# Patient Record
Sex: Male | Born: 1935 | Race: White | Hispanic: No | Smoking: Never smoker
Health system: Southern US, Community
[De-identification: ages and names within clinical notes are randomized; demographics above are authoritative.]

## PROBLEM LIST (undated history)

## (undated) DIAGNOSIS — I214 Non-ST elevation (NSTEMI) myocardial infarction: Secondary | ICD-10-CM

## (undated) DIAGNOSIS — R55 Syncope and collapse: Secondary | ICD-10-CM

## (undated) DIAGNOSIS — I480 Paroxysmal atrial fibrillation: Secondary | ICD-10-CM

## (undated) DIAGNOSIS — I2510 Atherosclerotic heart disease of native coronary artery without angina pectoris: Secondary | ICD-10-CM

## (undated) DIAGNOSIS — E785 Hyperlipidemia, unspecified: Secondary | ICD-10-CM

## (undated) DIAGNOSIS — R001 Bradycardia, unspecified: Secondary | ICD-10-CM

## (undated) DIAGNOSIS — I350 Nonrheumatic aortic (valve) stenosis: Secondary | ICD-10-CM

## (undated) DIAGNOSIS — I493 Ventricular premature depolarization: Secondary | ICD-10-CM

## (undated) HISTORY — PX: OTHER SURGICAL HISTORY: SHX169

## (undated) HISTORY — DX: Hyperlipidemia, unspecified: E78.5

## (undated) HISTORY — DX: Syncope and collapse: R55

## (undated) HISTORY — PX: CARDIAC CATHETERIZATION: SHX172

## (undated) HISTORY — DX: Ventricular premature depolarization: I49.3

## (undated) HISTORY — DX: Non-ST elevation (NSTEMI) myocardial infarction: I21.4

## (undated) HISTORY — DX: Bradycardia, unspecified: R00.1

## (undated) HISTORY — PX: EXPLORATORY LAPAROTOMY: SUR591

## (undated) HISTORY — DX: Atherosclerotic heart disease of native coronary artery without angina pectoris: I25.10

---

## 2005-07-07 ENCOUNTER — Ambulatory Visit: Payer: Self-pay | Admitting: Internal Medicine

## 2005-07-07 ENCOUNTER — Inpatient Hospital Stay (HOSPITAL_COMMUNITY): Admission: EM | Admit: 2005-07-07 | Discharge: 2005-07-10 | Payer: Self-pay | Admitting: Emergency Medicine

## 2005-07-23 ENCOUNTER — Ambulatory Visit: Payer: Self-pay | Admitting: Cardiology

## 2005-08-14 ENCOUNTER — Ambulatory Visit: Payer: Self-pay

## 2005-09-07 ENCOUNTER — Ambulatory Visit: Payer: Self-pay | Admitting: Internal Medicine

## 2005-11-28 ENCOUNTER — Ambulatory Visit: Payer: Self-pay

## 2005-11-28 ENCOUNTER — Ambulatory Visit: Payer: Self-pay | Admitting: Internal Medicine

## 2006-04-22 ENCOUNTER — Ambulatory Visit: Payer: Self-pay | Admitting: Internal Medicine

## 2006-09-02 ENCOUNTER — Ambulatory Visit: Payer: Self-pay

## 2006-10-29 ENCOUNTER — Ambulatory Visit: Payer: Self-pay | Admitting: Internal Medicine

## 2006-12-23 ENCOUNTER — Ambulatory Visit: Payer: Self-pay | Admitting: Unknown Physician Specialty

## 2007-06-27 ENCOUNTER — Ambulatory Visit: Payer: Self-pay | Admitting: Internal Medicine

## 2008-03-08 ENCOUNTER — Ambulatory Visit: Payer: Self-pay | Admitting: Internal Medicine

## 2008-09-27 ENCOUNTER — Encounter: Payer: Self-pay | Admitting: Internal Medicine

## 2008-12-02 DIAGNOSIS — I2510 Atherosclerotic heart disease of native coronary artery without angina pectoris: Secondary | ICD-10-CM

## 2008-12-06 ENCOUNTER — Ambulatory Visit: Payer: Self-pay | Admitting: Internal Medicine

## 2008-12-06 DIAGNOSIS — I493 Ventricular premature depolarization: Secondary | ICD-10-CM | POA: Insufficient documentation

## 2008-12-06 DIAGNOSIS — I4949 Other premature depolarization: Secondary | ICD-10-CM

## 2008-12-14 ENCOUNTER — Ambulatory Visit: Payer: Self-pay | Admitting: Internal Medicine

## 2009-02-02 ENCOUNTER — Ambulatory Visit: Payer: Self-pay | Admitting: Internal Medicine

## 2009-02-02 LAB — CONVERTED CEMR LAB
AST: 21 units/L (ref 0–37)
Cholesterol: 131 mg/dL (ref 0–200)
HDL: 48 mg/dL (ref >39.00)
LDL Cholesterol: 68 mg/dL (ref 0–99)
Total CHOL/HDL Ratio: 3
Triglycerides: 77 mg/dL (ref 0.0–149.0)
VLDL: 15.4 mg/dL (ref 0.0–40.0)

## 2009-04-04 ENCOUNTER — Encounter (INDEPENDENT_AMBULATORY_CARE_PROVIDER_SITE_OTHER): Payer: Self-pay | Admitting: *Deleted

## 2009-06-09 ENCOUNTER — Ambulatory Visit: Payer: Self-pay | Admitting: Internal Medicine

## 2009-06-09 DIAGNOSIS — E782 Mixed hyperlipidemia: Secondary | ICD-10-CM

## 2010-04-28 ENCOUNTER — Ambulatory Visit: Payer: Self-pay | Admitting: Internal Medicine

## 2010-04-28 ENCOUNTER — Encounter: Payer: Self-pay | Admitting: Internal Medicine

## 2010-06-07 ENCOUNTER — Encounter: Payer: Self-pay | Admitting: Internal Medicine

## 2010-06-07 ENCOUNTER — Ambulatory Visit
Admission: RE | Admit: 2010-06-07 | Discharge: 2010-06-07 | Payer: Self-pay | Source: Home / Self Care | Attending: Internal Medicine | Admitting: Internal Medicine

## 2010-06-07 DIAGNOSIS — I2510 Atherosclerotic heart disease of native coronary artery without angina pectoris: Secondary | ICD-10-CM | POA: Insufficient documentation

## 2010-06-07 DIAGNOSIS — E7800 Pure hypercholesterolemia, unspecified: Secondary | ICD-10-CM | POA: Insufficient documentation

## 2010-06-08 ENCOUNTER — Other Ambulatory Visit: Payer: Self-pay | Admitting: Internal Medicine

## 2010-06-08 ENCOUNTER — Ambulatory Visit
Admission: RE | Admit: 2010-06-08 | Discharge: 2010-06-08 | Payer: Self-pay | Source: Home / Self Care | Attending: Internal Medicine | Admitting: Internal Medicine

## 2010-06-08 LAB — LIPID PANEL
Cholesterol: 131 mg/dL (ref 0–200)
HDL: 47 mg/dL (ref >39.00)
LDL Cholesterol: 66 mg/dL (ref 0–99)
Total CHOL/HDL Ratio: 3
Triglycerides: 89 mg/dL (ref 0.0–149.0)
VLDL: 17.8 mg/dL (ref 0.0–40.0)

## 2010-06-08 LAB — TSH: TSH: 0.51 u[IU]/mL (ref 0.35–5.50)

## 2010-06-08 LAB — AST: AST: 20 U/L (ref 0–37)

## 2010-06-12 ENCOUNTER — Encounter: Payer: Self-pay | Admitting: Internal Medicine

## 2010-06-15 ENCOUNTER — Encounter: Payer: Self-pay | Admitting: Internal Medicine

## 2010-06-15 DIAGNOSIS — I4891 Unspecified atrial fibrillation: Secondary | ICD-10-CM | POA: Insufficient documentation

## 2010-06-18 LAB — CONVERTED CEMR LAB
BUN: 16 mg/dL (ref 6–23)
CO2: 32 meq/L (ref 19–32)
Calcium: 9.5 mg/dL (ref 8.4–10.5)
Chloride: 105 meq/L (ref 96–112)
Creatinine, Ser: 1.1 mg/dL (ref 0.4–1.5)
GFR calc non Af Amer: 69.78 mL/min (ref >60)
Glucose, Bld: 91 mg/dL (ref 70–99)
Magnesium: 2.2 mg/dL (ref 1.5–2.5)
Potassium: 4 meq/L (ref 3.5–5.1)
Sodium: 142 meq/L (ref 135–145)
TSH: 0.65 microintl units/mL (ref 0.35–5.50)

## 2010-06-20 NOTE — Assessment & Plan Note (Signed)
Summary: per check out/sf  Medications Added METOPROLOL SUCCINATE 25 MG XR24H-TAB (METOPROLOL SUCCINATE) Take one tablet  two times a day NITROSTAT 0.4 MG SUBL (NITROGLYCERIN) 1 tablet under tongue at onset of chest pain; you may repeat every 5 minutes for up to 3 doses.      Allergies Added: NKDA  Visit Type:  Follow-up Primary Provider:  Dr [facility] in [place]  CC:  no complaints.  History of Present Illness: Mr. Wendt is a 77 year old with a history of CAD (s/p NSTEMI in 2/07 with PTCA to an OM), dyslipidemia and PVCs.  I last saw him in July. When I saw him he had a couplet on his EKG.  I set him up for a holter monitor.  We wore this and it showed freq PVCs, couplets, few triplets.  He has remained asymptomatic throughout.  No palpitations.  Labs were normal. He continues to deny chest pain.  He is walking daily.  Current Medications (verified): 1)  Metoprolol Succinate 25 Mg Xr24h-Tab (Metoprolol Succinate) .... Take One Tablet  Two Times A Day 2)  Aspirin Ec 325 Mg Tbec (Aspirin) .... Take One Tablet By Mouth Daily 3)  Zocor 80 Mg Tabs (Simvastatin) .Marland Kitchen.. 1 Tablet At Bedtime 4)  Nitrostat 0.4 Mg Subl (Nitroglycerin) .Marland Kitchen.. 1 Tablet Under Tongue At Onset of Chest Pain; You May Repeat Every 5 Minutes For Up To 3 Doses.  Allergies (verified): No Known Drug Allergies  Past History:  Past Surgical History: Last updated: 12/02/2008 1.  Cardiac catheterization.  2.  Coronary arteriogram.  3.  Left ventriculogram.  4.  Reperfusion of obtuse marginal with a wire alone.     Social History: Last updated: 12/02/2008 He is married, divorced, and now living with his ex-wife.  He has three children.  He continues to drive a tractor-trailers.  He does  not use cigarettes or alcohol or recreational drugs.  Past Medical History:  IMPRESSION:  1.  Non-ST elevated myocardial infarction.  2.  Relative bradycardia, precluding up titration of his beta-blocker.  3.   Remote history of syncope.  4.  Cardiac risk factors notable for, negative hypertension, negative      diabetes, negative cigarettes, question cholesterol, positive family      history.   Current Problems:  ATRIAL FIBRILLATION, PAROXYSMAL (ICD-427.31) CAD (ICD-414.00) PVCs  Review of Systems       All systems reviewed.  Negative to the above problem except as noted.  Vital Signs:  Patient profile:   77 year old male Height:      67 inches Weight:      189 pounds BMI:     29.71 Pulse rate:   57 / minute BP sitting:   105 / 67  (left arm) Cuff size:   regular  Vitals Entered By: Burnett Kanaris, CNA (June 09, 2009 11:55 AM)  Physical Exam  Additional Exam:  HEENT:  Normocephalic, atraumatic. EOMI, PERRLA.  Neck: JVP is normal. No thyromegaly. No bruits.  Lungs: clear to auscultation. No rales no wheezes.  Heart: Regular rate and rhythm. Normal S1, S2. No S3.   No significant murmurs. PMI not displaced.  Abdomen:  Supple, nontender. Normal bowel sounds. No masses. No hepatomegaly.  Extremities:   Good distal pulses throughout. No lower extremity edema.  Musculoskeletal :moving all extremities.  Neuro:   alert and oriented x3.    EKG  Procedure date:  06/09/2009  Findings:      sinus bradycardia.  57 bpm.  Biphasic T waves in III, AVF  Impression & Recommendations:  Problem # 1:  CAD (ICD-414.00) Doing well. Asymtomatic.  Tolerating activity.  continue current regimen.  Problem # 2:  VENTRICULAR ECTOPY (ICD-427.69) Patient asymptomatic.  Normal exam.  No pVCs on EKG today.  Continue to follow.  NO change in regimen.  Problem # 3:  HYPERLIPIDEMIA-MIXED (ICD-272.4) Keep on statin.  LDL was 68, HDL was 48 in September. His updated medication list for this problem includes:    Zocor 80 Mg Tabs (Simvastatin) .Marland Kitchen... 1 tablet at bedtime

## 2010-06-22 NOTE — Miscellaneous (Signed)
  Clinical Lists Changes  Medications: Changed medication from ZOCOR 80 MG TABS (SIMVASTATIN) 1 tablet at bedtime to ZOCOR 80 MG TABS (SIMVASTATIN) one half  tablet at bedtime

## 2010-06-22 NOTE — Assessment & Plan Note (Signed)
Summary: PER CHECK OU T/SF  Medications Added METOPROLOL SUCCINATE 25 MG XR24H-TAB (METOPROLOL SUCCINATE) Take one tablet one time a day      Allergies Added: NKDA  Visit Type:  Follow-up Primary Provider:  Dr [facility] in [place]  CC:  No cardiac complaints.  History of Present Illness: Mr. Machuca is a 77 year old gentleman. He has a history of CAD (status post non-Q-wave MI in February 2007. Status post PTCA to OM lesion at that time.). SInce he was last seen he denies chest pain.    Breathing is OK.  Remains active.  Current Medications (verified): 1)  Metoprolol Succinate 25 Mg Xr24h-Tab (Metoprolol Succinate) .... Take One Tablet  Two Times A Day 2)  Aspirin Ec 325 Mg Tbec (Aspirin) .... Take One Tablet By Mouth Daily 3)  Zocor 80 Mg Tabs (Simvastatin) .Marland Kitchen.. 1 Tablet At Bedtime 4)  Nitrostat 0.4 Mg Subl (Nitroglycerin) .Marland Kitchen.. 1 Tablet Under Tongue At Onset of Chest Pain; You May Repeat Every 5 Minutes For Up To 3 Doses.  Allergies (verified): No Known Drug Allergies  Past History:  Past medical, surgical, family and social histories (including risk factors) reviewed, and no changes noted (except as noted below).  Past Medical History: Reviewed history from 06/09/2009 and no changes required.  IMPRESSION:  1.  Non-ST elevated myocardial infarction.  2.  Relative bradycardia, precluding up titration of his beta-blocker.  3.  Remote history of syncope.  4.  Cardiac risk factors notable for, negative hypertension, negative      diabetes, negative cigarettes, question cholesterol, positive family      history.   Current Problems:  ATRIAL FIBRILLATION, PAROXYSMAL (ICD-427.31) CAD (ICD-414.00) PVCs  Past Surgical History: Reviewed history from 12/02/2008 and no changes required. 1.  Cardiac catheterization.  2.  Coronary arteriogram.  3.  Left ventriculogram.  4.  Reperfusion of obtuse marginal with a wire alone.     Family History: Reviewed history  and no changes required.  Social History: Reviewed history from 12/02/2008 and no changes required. He is married, divorced, and now living with his ex-wife.  He has three children.  He continues to drive a tractor-trailers.  He does  not use cigarettes or alcohol or recreational drugs.  Review of Systems       ALl systems reviewed.  Neg tothe above problem.  Vital Signs:  Patient profile:   77 year old male Height:      67 inches Weight:      183 pounds BMI:     28.77 Pulse rate:   60 / minute Pulse rhythm:   irregular Resp:     18 per minute BP sitting:   100 / 60  (left arm) Cuff size:   large  Vitals Entered By: Vikki Ports (April 28, 2010 8:51 AM)  Physical Exam  Additional Exam:  patient is in NAD HEENT:  Normocephalic, atraumatic. EOMI, PERRLA.  Neck: JVP is normal. No thyromegaly. No bruits.  Lungs: clear to auscultation. No rales no wheezes.  Heart: Regular rate and rhythm. Normal S1, S2. No S3.   No significant murmurs. PMI not displaced.  Abdomen:  Supple, nontender. Normal bowel sounds. No masses. No hepatomegaly.  Extremities:   Good distal pulses throughout. No lower extremity edema.  Musculoskeletal :moving all extremities.  Neuro:   alert and oriented x3.    EKG  Procedure date:  04/28/2010  Findings:      Normal sinus rhythm with ventricular bigeminy  75 bpm.  Impression &  Recommendations:  Problem # 1:  CAD (ICD-414.00) No symtoms to suggest ischemia. His updated medication list for this problem includes:    Metoprolol Succinate 25 Mg Xr24h-tab (Metoprolol succinate) .Marland Kitchen... Take one tablet one time a day    Aspirin Ec 325 Mg Tbec (Aspirin) .Marland Kitchen... Take one tablet by mouth daily    Nitrostat 0.4 Mg Subl (Nitroglycerin) .Marland Kitchen... 1 tablet under tongue at onset of chest pain; you may repeat every 5 minutes for up to 3 doses.  Problem # 2:  VENTRICULAR ECTOPY (ICD-427.69) Would set  up for holter monitor. His updated medication list for this  problem includes:    Metoprolol Succinate 25 Mg Xr24h-tab (Metoprolol succinate) .Marland Kitchen... Take one tablet one time a day    Aspirin Ec 325 Mg Tbec (Aspirin) .Marland Kitchen... Take one tablet by mouth daily    Nitrostat 0.4 Mg Subl (Nitroglycerin) .Marland Kitchen... 1 tablet under tongue at onset of chest pain; you may repeat every 5 minutes for up to 3 doses.  Orders: EKG w/ Interpretation (93000) Holter Monitor (Holter Monitor)  Problem # 3:  HYPERLIPIDEMIA-MIXED (ICD-272.4) Get lipids. His updated medication list for this problem includes:    Zocor 80 Mg Tabs (Simvastatin) .Marland Kitchen... 1 tablet at bedtime  Patient Instructions: 1)  Your physician has recommended that you wear a holter monitor.  Holter monitors are medical devices that record the heart's electrical activity. Doctors most often use these monitors to diagnose arrhythmias. Arrhythmias are problems with the speed or rhythm of the heartbeat. The monitor is a small, portable device. You can wear one while you do your normal daily activities. This is usually used to diagnose what is causing palpitations/syncope (passing out).  OK to schedule in January 2012 2)  Your physician recommends that you return for a FASTING lipid profile: Lipid ast tsh 272.0 414.01 schedule same day as holter. 3)  Your physician wants you to follow-up in: 6 months  You will receive a reminder letter in the mail two months in advance. If you don't receive a letter, please call our office to schedule the follow-up appointment.

## 2010-06-22 NOTE — Miscellaneous (Signed)
  Clinical Lists Changes  Problems: Added new problem of ATRIAL FIBRILLATION (ICD-427.31) Orders: Added new Referral order of Echocardiogram (Echo) - Signed

## 2010-06-28 NOTE — Procedures (Signed)
Summary: Summary Report  Summary Report   Imported By: Erle Crocker 06/21/2010 14:20:28  _____________________________________________________________________  External Attachment:    Type:   Image     Comment:   External Document

## 2010-07-04 ENCOUNTER — Other Ambulatory Visit (INDEPENDENT_AMBULATORY_CARE_PROVIDER_SITE_OTHER): Payer: Medicare Other

## 2010-07-04 ENCOUNTER — Other Ambulatory Visit: Payer: Self-pay | Admitting: Internal Medicine

## 2010-07-04 DIAGNOSIS — I2510 Atherosclerotic heart disease of native coronary artery without angina pectoris: Secondary | ICD-10-CM

## 2010-10-03 NOTE — Assessment & Plan Note (Signed)
[facility]                            CARDIOLOGY OFFICE NOTE   NAME:[name] [name], [name] [name]                      MRN:          [id number]  DATE:10/29/2006                            DOB:          17-Jan-1934    IDENTIFICATION:  Patient is a 77 year old gentleman with a history of  CAD (status post non-Q wave MI in February 2007 with PTCA of an occluded  LM opened with a wire).  He has been on Plavix and aspirin for over a  year.   Note, in April he had a Myoview done for work purposes.  He exercised  for 10 minutes to a peak heart rate of 160, which was greater than 100%  predicted maximal (107%).  Myoview portion showed evidence of a small  prior lateral infarct with mild periinfarction ischemia not changed from  previous scans.  Overall, felt to be low risk.  EF was 55%.   Again, in talking to the patient, he denies chest pain, walking sound.   CURRENT MEDICATIONS:  Include:  1. Lipitor 80.  2. Plavix 75.  3. Aspirin 325.  4. Metoprolol 25 b.i.d.  5. Flomax 0.4.   PHYSICAL EXAMINATION:  On exam, the patient is in no distress.  Blood pressure is 116/68.  Pulse is 62.  Weight 185.  NECK:  JVP is normal.  LUNGS:  Clear.  CARDIAC EXAM:  Regular rate and rhythm.  S1 and S2.  No S3.  ABDOMEN:  Benign.  EXTREMITIES:  No edema.   IMPRESSION:  1. Coronary artery disease.  Clinically doing well.  Again, we will      confirm with Dr. Samule Ohm regarding Plavix, but may discontinue.      Continue other medications.  2. Dyslipidemia.  Excellent control.  I just saw the values that were      taken, and I would continue.  LDL was 61, HDL of 40.   Set followup for about 8 months, sooner if problems develop.     Pricilla Riffle, MD, [facility]  Electronically Signed   PVR/MedQ  DD: 10/29/2006  DT: 10/29/2006  Job #: [id number]   cc:   Yates Decamp

## 2010-10-03 NOTE — Assessment & Plan Note (Signed)
[facility]                            CARDIOLOGY OFFICE NOTE   NAME:[name], [name] HOWLAND                      MRN:          [id number]  DATE:06/27/2007                            DOB:          [birth year]    IDENTIFICATION:  [name] [name] is a 77 year old gentleman with a history of  CAD (status post non-Q-wave MI in February 2007 with PTCA to an OM  lesion).  He is currently on aspirin, metoprolol and Lipitor.   He was seen in June of last of this past year.   In the interval he has done okay.  He had one episode of dizziness.  Heart fluttered for a second but has had none since.  No chest pain.  Breathing is good.   CURRENT MEDICATIONS:  Lipitor 80, aspirin 325, metoprolol 25 b.i.d.   PHYSICAL EXAM:  The patient is in no distress.  Blood pressure is 112/68, pulse is 62.  Note orthostatics were checked  which were negative.  LUNGS:  Clear.  Cardiac exam regular rate and rhythm, S1-S2 no S3 no murmurs.  ABDOMEN:  Benign.  EXTREMITIES:  No edema.   IMPRESSION:  1. Coronary artery disease appears to be stable.  Encouraged him to      stay active.  Continue on current regimen.  2. Dyslipidemia.  Last lipid panel I have was remote.  The patient is      due to have it checked and he will have the results faxed.   Otherwise I will set follow-up for the fall sooner if problems develop.     Pricilla Riffle, MD, [facility]  Electronically Signed    PVR/MedQ  DD: 06/29/2007  DT: 06/30/2007  Job #: [id number]   cc:   Yates Decamp

## 2010-10-03 NOTE — Assessment & Plan Note (Signed)
[facility]                            CARDIOLOGY OFFICE NOTE   NAME:[name] [name], [name] [name]                      MRN:          [id number]  DATE:03/08/2008                            DOB:          [birth year]    INTRODUCTION:  Mr. Szilagyi is a 77 year old gentleman with history of CAD  (non-Q-wave MI February 2007 with PTCA to OM lesion).  I last saw him in  February.   In the interval, he denies chest pain.  No dizziness.  No shortness of  breath.  He remains active.  He is currently renovating a home that he  is getting ready to move in to.  He just retired couple weeks ago.   CURRENT MEDICINES:  1. Lipitor 80.  2. Aspirin 325.  3. Metoprolol 25 b.i.d.   PHYSICAL EXAMINATION:  GENERAL:  The patient is in no distress.  VITAL SIGNS:  Blood pressure 103/63, pulse 67 and regular, weight 182.  NECK:  JVP is normal.  No bruits.  LUNGS:  Clear.  CARDIAC:  Regular rate and rhythm, S1, S2.  No S3.  No significant  murmurs.  ABDOMEN:  Benign.  EXTREMITIES:  No edema.   IMPRESSION:  1. Coronary artery disease, clinically doing well.  Would continue.  2. Dyslipidemia.  Last lipid panel at the [facility] was      excellent, LDL of 55, HDL of 42.  Would continue.  I told him when      he gets his blood work checked again, to have him fax the results.   I had encouraged him to stay active.  I will set to see him back in  about 9 months' time.     Pricilla Riffle, MD, [facility]  Electronically Signed    PVR/MedQ  DD: 03/08/2008  DT: 03/09/2008  Job #: [id number]   cc:   Yates Decamp

## 2010-10-06 NOTE — H&P (Signed)
NAME:  [name] [name], BREEDEN NO.:  [id number]   MEDICAL RECORD NO.:  [id number]          PATIENT TYPE:  EMS   LOCATION:  MAJO                         FACILITY:  [facility]   PHYSICIAN:  Duke Salvia, M.D.  DATE OF BIRTH:  11/17/1933   DATE OF ADMISSION:  07/07/2005  DATE OF DISCHARGE:                                HISTORY & PHYSICAL   Mr. [name] [name] is a 77 year old gentleman who had no known prior heart  history who does have a history of GE reflux disease and last night after  dinner, developed a severe substernal chest discomfort that he describes as  a pressure radiating into his neck and his arms bilaterally.  He took Tums  and antacids with mild relief, sat around, symptoms ameliorated somewhat.  This morning he called his brother who has a history of coronary disease,  prior CABG, came over and gave him nitroglycerin with significant relief of  his discomfort, and he was brought here to [facility] for further  treatment.  Despite aggressive medical therapy, he remains with some  discomfort which he describes as a heaviness in his chest still.   His cardiac risk factors are negative for hypertension, diabetes, cigarette  use.  His cholesterol status is not known but he thinks that he has been  told that it is good.   Of cardiac note, is an episode of syncope about three to four years ago that  occurred while he was driving his car across a parking lot.  He has no prior  syncope and no subsequent syncope.  He does have a history of tachy  palpitations over the last 10 years.  These episodes last typically about 30  seconds and are associated with lightheadedness and shortness of breath but  no chest discomfort.   He takes no medications.   He is allergic to TETANUS.   REVIEW OF SYSTEMS:  Broadly negative.   SOCIAL HISTORY:  He is married, divorced, and now living with his ex-wife.  He has three children.  He continues to drive a tractor-trailers.   He does  not use cigarettes or alcohol or recreational drugs.   PHYSICAL EXAMINATION:  GENERAL:  He is an elderly Caucasian male appearing  considerably older than his stated age of 15.  VITAL SIGNS:  His blood pressure was 123/74 with a pulse of 69 initially.  It is now 97/46 on nitroglycerin with a pulse of 55.  He has also received  IV Lopressor.  His weight is 81 kilos.  HEENT:  Demonstrated no icterus or xanthomata.  NECK:  Veins were flat.  Carotids were brisk and full bilaterally without  bruits.  BACK:  Without kyphosis, scoliosis.  LUNGS:  Clear.  HEART:  Sounds were regular without murmurs or gallops.  ABDOMEN:  Soft with active bowel sounds without midline pulsation or  hepatomegaly.  Femoral pulses were 2+.  EXTREMITIES:  Distal pulses were intact.  There is no clubbing, cyanosis, or  edema.  NEUROLOGIC:  Grossly normal.  SKIN:  Warm and dry.   Electrocardiogram, dated earlier this morning at  8:30, demonstrated sinus  rhythm at 55 with intervals of 1.3/1.0/0.43.  There are no ST-T changes.   LABORATORY:  Notable for a positive troponin of 8.4 and an MB of 780 (I  think.)  The MB was greater than 80 off of his POC.  His creatinine was  normal.  His hemogram was normal.   IMPRESSION:  1.  Non-ST elevated myocardial infarction.  2.  Relative bradycardia, precluding up titration of his beta-blocker.  3.  Remote history of syncope.  4.  Cardiac risk factors notable for, negative hypertension, negative      diabetes, negative cigarettes, question cholesterol, positive family      history.   [name] has a non-ST elevation myocardial infarction.  We have not been  able to control his symptoms with aggressive medical therapy including  heparin, aspirin, and Integrilin, low dose beta-blockers, nitroglycerin.  Because of that, we have discussed and plan to take him to urgent  catheterization.           ______________________________  Duke Salvia, M.D.      SCK/MEDQ  D:  07/07/2005  T:  07/07/2005  Job:  [id number]   cc:   Yates Decamp  Fax: [phone]

## 2010-10-06 NOTE — Letter (Signed)
Sep 20, 2006    Donna Bernard, D.O.  [address]  [place], [place] [address]   RE:  EKANSH, SHERK  MRN:  [id number]  /  DOB:  [birth year]   Dear Dr. Orson Aloe:   This is a letter in support of Mr. Stratmann.  He is a 77 year old  gentleman with a history of CAD.  He has had angioplasty in the past  (February 2007).   I saw the patient in clinic back in December.  Clinically he was doing  well.  He denied chest pain.  No dizziness, no shortness of breath.  Remained active.   A Myoview scan was done on April 14 that showed a small lateral infarct  with minimal peri-infarct ischemia.  Overall this is a very low-risk  scan.  It had not changed from a scan in July 2007.   Based on this and his findings, I felt he was okay to drive without any  further testing.  If you have any questions, please feel free to contact  me.    Sincerely,      Pricilla Riffle, MD, [facility]  Electronically Signed    PVR/MedQ  DD: 09/20/2006  DT: 09/20/2006  Job #: [id number]

## 2010-10-06 NOTE — Discharge Summary (Signed)
NAMEBRYLON, [name] [name]               ACCOUNT NO.:  [id number]   MEDICAL RECORD NO.:  [id number]          PATIENT TYPE:  INP   LOCATION:  3715                         FACILITY:  [facility]   PHYSICIAN:  Olga Millers, M.D. LHCDATE OF BIRTH:  Jun 27, 1933   DATE OF ADMISSION:  07/07/2005  DATE OF DISCHARGE:  07/10/2005                                 DISCHARGE SUMMARY   PRIMARY DIAGNOSIS:  Non-ST-segment-elevation myocardial infarction, status  post reperfusion of obtuse marginal branch with wire alone, no other  significant coronary artery disease and ejection fraction 50%.   SECONDARY DIAGNOSES:  1.  Hyperlipidemia with a total cholesterol of 216, triglycerides 132, HDL      43, LDL 147, this admission.  2.  Allergy to TETANUS.  3.  History of tachy-palpitations, brief episode of paroxysmal atrial      fibrillation seen this admission.  4.  Family history of coronary artery disease in his brother, who has had      bypass surgery.  5.  Remote history of syncope.   PROCEDURES:  1.  Cardiac catheterization.  2.  Coronary arteriogram.  3.  Left ventriculogram.  4.  Reperfusion of obtuse marginal with a wire alone.   HOSPITAL COURSE:  [name] [name] is a 77 year old male with no known history of  coronary artery disease.  He had chest discomfort and got some relief with  antacids.  The next day he called his brother because he was still having  discomfort, who gave him some nitroglycerin, as he has a history of heart  disease and bypass surgery.  He [facility] to [facility], but he  continued to have heaviness in his chest despite aggressive treatment and  was taken to the cath lab.   The cardiac catheterization showed a totaled, relatively small obtuse  marginal branch.  It was crossed with a wire and there was less than 20%  residual stenosis.  There was TIMI-3 flow.  The patient's chest discomfort  resolved.  Dr. Samule Ohm evaluated the situation and felt that since the wire  could not  be made to advance around a second bend in the vessel and he had  TIMI-3 flow, he felt that the results were likely to be stable.  No balloon  angioplasty or stenting were performed.  His EF was 50% without regional  wall motion abnormality and no AS or MR.  He had no other critical coronary  artery disease; there was a 20% and a 40% stenosis in the LAD for which  medical therapy was recommended.   He was placed on aspirin indefinitely and is to be on Plavix for a year  secondary to acute coronary syndrome.  He was on Integrilin for 18 hours.  He was started on a statin and a beta blocker.   [name] [name] had an episode of paroxysmal atrial fibrillation which resolved  spontaneously.  His beta blocker was up-titrated to metoprolol 25 mg twice  daily.  He tolerated this well.  His systolic blood pressure was between 100  and 110 and his heart rate was in the 60s.  His  BUN and creatinine were  normal post cath.   His right groin had a bruit post procedure and an ultrasound of his groin is  pending.  If there is no pseudoaneurysm, he is tentatively considered stable  for discharge on July 10, 2005 with outpatient followup arranged.   DISCHARGE INSTRUCTIONS:  1.  His activity level is to be increased gradually.  He is not to drive for      3 days and not to lift anything for 4 weeks.  2.  He is to call our office for problems with the cath site.  3.  He is to stick to a low-fat diet.  4.  He is referred to outpatient cardiac rehab.   DISCHARGE MEDICATIONS:  1.  Lipitor 80 mg daily at supper.  2.  Plavix 75 mg daily.  3.  Aspirin 325 mg daily.  4.  Metoprolol 25 mg twice daily.  5.  Nitroglycerin sublingual p.r.n.  6.  He is to take Tylenol as needed for pain and avoid Aleve if possible.      [name] [name], P.A. [facility]    ______________________________  Olga Millers, M.D. [facility]    RB/MEDQ  D:  07/10/2005  T:  07/11/2005  Job:  [id number]   cc:   Pricilla Riffle, M.D.  [address]  [address]  [place]  [place] [address]   Yates Decamp  Fax: [phone]

## 2010-10-06 NOTE — Assessment & Plan Note (Signed)
[facility]                              CARDIOLOGY OFFICE NOTE   NAME:Tay, PETROS AHART                      MRN:          [id number]  DATE:11/28/2005                            DOB:          14-Oct-1933    IDENTIFICATION:  Mr. Hennick is a 77 year old gentleman with a history of CAD  (status post non-Q-wave MI in February) underwent cardiac catheterization  and found to have occluded OM which was opened with wire.  Residual 20%  stenosis.  He also had a 20-40% stenosis in the LAD.  Plan is for medical  therapy.  LVF was 50%.   The patient had a Myoview scan after which showed low risk.  It had mild  mixed ischemia infarct inferolaterally.   In the interval since seen in the clinic the patient has done well.  He is  active.  He denies chest pain.  No shortness of breath.  Nothing like what  brought him into the emergency room.   CURRENT MEDICATIONS:  1.  Lipitor 80 mg q.h.s.  2.  Plavix 75 daily.  3.  Aspirin 325 daily.  4.  Metoprolol 25 b.i.d.   PHYSICAL EXAMINATION:  GENERAL:  The patient is in no distress.  VITAL SIGNS:  Blood pressure 131/80, pulse 70.  NECK:  JVP is normal.  No bruits.  LUNGS:  Clear to auscultation.  CARDIAC:  Regular rate and rhythm.  S1, S2.  No S3, S4 or murmurs.  ABDOMEN:  Benign.  EXTREMITIES:  No edema.   Myoview scan:  The patient underwent repeat exercise testing with Myoview  today.  He exercised for 8 minutes to peak heart rate of 139 which was 92%  maximal.  The patient experienced to chest pain.  EKG showed no significant  ST changes.   Again, on the Myoview scan he had some mild ischemia scar in the  inferolateral region.  Again, very mild.  The apical region had some  thinning that improved consistent of a very small region of ischemia that  however was not significant quantitatively.  In comparison to 08/14/05 scan  the apical region was a little more prominent but again, the area was small.  LVF was  calculated as 55%.   Overall, the patient is doing well.  I would continue medical therapy.  I do  not think that his scan warrants any further testing for now.  I am not  concerned about any worsening ischemia.   From a cardiac standpoint I think that he is okay to continue driving as  long as he stays on medical therapy.  I will set followup for later in the  winter.                                Pricilla Riffle, MD, [facility]    PVR/MedQ  DD:  11/28/2005  DT:  11/29/2005  Job #:  [id number]

## 2010-10-06 NOTE — Cardiovascular Report (Signed)
NAMEVALON, GLASSCOCK               ACCOUNT NO.:  [id number]   MEDICAL RECORD NO.:  [id number]          PATIENT TYPE:  INP   LOCATION:  1824                         FACILITY:  [facility]   PHYSICIAN:  Salvadore Farber, M.D. LHCDATE OF BIRTH:  10/06/1933   DATE OF PROCEDURE:  07/07/2005  DATE OF DISCHARGE:                              CARDIAC CATHETERIZATION   PROCEDURE:  Left heart catheterization, left ventriculography, coronary  angiography, reperfusion of the occluded obtuse marginal branch using wire  alone.   INDICATIONS:  [name] [name] is a 77 year old gentleman without prior history of  cardiac disease. He presented to the [facility] Emergency Room this morning  with several hours of substernal chest discomfort. He was treated with  aspirin, heparin, eptifibatide, nitroglycerin, and morphine. Pain could not  be relieved. Troponin was elevated at 16. He was referred for urgent  angiography due to ongoing chest pain and myocardial injury.  Electrocardiogram did not demonstrate any ST elevations.   PROCEDURAL TECHNIQUE:  Informed consent was obtained. Under 1% lidocaine  local anesthesia, a 6-French sheath was placed in the right common femoral  artery using modified Seldinger technique. Diagnostic angiography and  ventriculography were performed using JL-4, JR-4, and pigtail catheters.  These images demonstrated the culprit lesion to be an occlusion of a  relatively small obtuse marginal branch. Given his ongoing pain, decision  was made to proceed to percutaneous revascularization.   Anticoagulation was initiated with an additional bolus of eptifibatide to  make for 2 boluses. ACT was maintained at greater than 200 seconds. CHAMPION  study drugs were administered. A __________ 35 guide was advanced over wire  and engaged in the ostium of the left main. A Prowater wire was used to  probe the occlusion. With wire passage across the occlusion, TIMI 3 flow was  established to the distal  vessel. There was less than 20% residual stenosis.  The effected vessel was approximately 1.5 mm in diameter.   After opening the vessel, the wire could not be made to advance around a  second bend in the vessel. Given the resolution of the patient's chest  discomfort, establishment of TIMI 3 flow, and less than 20% residual  stenosis, I thought that the result was likely to be stable; and additional  attempts to probe the very tortuous vessel were with more likely to be  deleterious than helpful. We, therefore, did not proceed to any balloon  angioplasty or stenting. Final angiography demonstrated less than 20%  residual stenosis and TIMI 3 flow to the distal vasculature.   COMPLICATIONS:  None.   FINDINGS:  1.  LV: 128/8/12. EF 50% without regional wall motion abnormality.  2.  No aortic stenosis or mitral regurgitation.   IMPRESSION/RECOMMENDATIONS:  Successful percutaneous revascularization of an  occluded small obtuse marginal branch. The vessel was opened with wire  alone. The patient will be continued on Integrilin for 18 hours. Aspirin  will be continued indefinitely. Plavix will be continued for a year given  his acute coronary syndrome presentation. Statin will be initiated.      Salvadore Farber, M.D. [facility]  Electronically Signed     WED/MEDQ  D:  07/07/2005  T:  07/08/2005  Job:  [id number]   cc:   Duke Salvia, M.D.  [address]  [address]  [place]  [place] [address]   [facility] Dr. Dan Humphreys

## 2010-10-06 NOTE — Assessment & Plan Note (Signed)
[facility]                            CARDIOLOGY OFFICE NOTE   NAME:Bergfeld, [name] [name]                      MRN:          [id number]  DATE:04/22/2006                            DOB:          1933-11-22    PATIENT IDENTIFICATION:  [name] [name] is a 77 year old gentleman with  history of CAD (status post non-Q wave MI in February with PTCA of an  occluded OM, opened with a wire.), although noted to have a 20-40% LAD  lesion.  Last seen in July.   In the interval, he has done very well.  He denies chest pain, shortness  of breath, dizziness.  He remains active.   CURRENT MEDICATIONS:  1. Lipitor 80 q.h.s.  2. Plavix 75 daily.  3. Aspirin 325 daily.  4. Metoprolol 25 b.i.d.   PHYSICAL EXAMINATION:  GENERAL:  The patient is in no distress.  VITAL SIGNS:  Blood pressure 112/62, pulse 63, weight 188.  NECK:  No bruits.  LUNGS:  Clear.  CARDIAC:  Regular rate and rhythm.  S1, S2.  No S3.  No murmurs.  ABDOMEN:  Benign.  EXTREMITIES:  No edema.   STUDIES:  A 12-lead EKG normal sinus rhythm 65 beats per minute,  occasional PVC.  Nonspecific ST changes.   IMPRESSION:  1. Coronary artery disease, clinically doing well.  I would continue      on the current regimen.  Plan was for Plavix for at least a year.      I will discuss with Geralynn Rile regarding long term.  2. Dyslipidemia.  Recent lipid panel done at primary showed a total      cholesterol of 117, LDL 66, HDL 39.  I would continue on Lipitor      80.  3. I will set follow up for six months, sooner if problems develop.     Pricilla Riffle, MD, [facility]  Electronically Signed    PVR/MedQ  DD: 04/22/2006  DT: 04/23/2006  Job #: [id number]   cc:   Dan Humphreys, M.D.

## 2010-12-04 ENCOUNTER — Encounter: Payer: Self-pay | Admitting: Internal Medicine

## 2011-03-04 ENCOUNTER — Other Ambulatory Visit: Payer: Self-pay | Admitting: Internal Medicine

## 2011-03-05 ENCOUNTER — Other Ambulatory Visit: Payer: Self-pay

## 2011-03-05 MED ORDER — SIMVASTATIN 80 MG PO TABS: 80.0000 mg | ORAL_TABLET | Freq: Every day | ORAL | Status: DC

## 2011-03-05 NOTE — Telephone Encounter (Signed)
Med dose wrong

## 2011-06-01 ENCOUNTER — Encounter: Payer: Self-pay | Admitting: Internal Medicine

## 2011-06-02 ENCOUNTER — Other Ambulatory Visit: Payer: Self-pay | Admitting: Internal Medicine

## 2011-06-04 ENCOUNTER — Encounter: Payer: Self-pay | Admitting: Internal Medicine

## 2011-06-04 ENCOUNTER — Ambulatory Visit (INDEPENDENT_AMBULATORY_CARE_PROVIDER_SITE_OTHER): Payer: Medicare Other | Admitting: Internal Medicine

## 2011-06-04 DIAGNOSIS — I4891 Unspecified atrial fibrillation: Secondary | ICD-10-CM

## 2011-06-04 DIAGNOSIS — I4949 Other premature depolarization: Secondary | ICD-10-CM

## 2011-06-04 DIAGNOSIS — I2510 Atherosclerotic heart disease of native coronary artery without angina pectoris: Secondary | ICD-10-CM

## 2011-06-04 DIAGNOSIS — E785 Hyperlipidemia, unspecified: Secondary | ICD-10-CM

## 2011-06-04 LAB — HEPATIC FUNCTION PANEL
AST: 18 U/L (ref 0–37)
Albumin: 4.2 g/dL (ref 3.5–5.2)
Alkaline Phosphatase: 51 U/L (ref 39–117)
Total Bilirubin: 0.9 mg/dL (ref 0.3–1.2)

## 2011-06-04 LAB — CBC WITH DIFFERENTIAL/PLATELET
Eosinophils Relative: 1.3 % (ref 0.0–5.0)
HCT: 41.3 % (ref 39.0–52.0)
Hemoglobin: 14.2 g/dL (ref 13.0–17.0)
Lymphocytes Relative: 34.5 % (ref 12.0–46.0)
Lymphs Abs: 2 10*3/uL (ref 0.7–4.0)
Monocytes Relative: 14.1 % — ABNORMAL HIGH (ref 3.0–12.0)
Neutro Abs: 2.8 10*3/uL (ref 1.4–7.7)
RBC: 4.14 Mil/uL — ABNORMAL LOW (ref 4.22–5.81)
WBC: 5.7 10*3/uL (ref 4.5–10.5)

## 2011-06-04 LAB — TSH: TSH: 0.51 u[IU]/mL (ref 0.35–5.50)

## 2011-06-04 LAB — BASIC METABOLIC PANEL
CO2: 29 mEq/L (ref 19–32)
Calcium: 8.7 mg/dL (ref 8.4–10.5)
Chloride: 97 mEq/L (ref 96–112)
Creatinine, Ser: 0.8 mg/dL (ref 0.4–1.5)
Glucose, Bld: 103 mg/dL — ABNORMAL HIGH (ref 70–99)

## 2011-06-04 LAB — LIPID PANEL
HDL: 61.6 mg/dL (ref >39.00)
LDL Cholesterol: 64 mg/dL (ref 0–99)
Total CHOL/HDL Ratio: 2
Triglycerides: 82 mg/dL (ref 0.0–149.0)

## 2011-06-04 MED ORDER — SIMVASTATIN 80 MG PO TABS: 80.0000 mg | ORAL_TABLET | Freq: Every day | ORAL | Status: DC

## 2011-06-04 MED ORDER — METOPROLOL SUCCINATE ER 25 MG PO TB24: 25.0000 mg | ORAL_TABLET | Freq: Every day | ORAL | Status: DC

## 2011-06-04 NOTE — Patient Instructions (Signed)
Lab work today We will call you with results.  Your physician wants you to follow-up in: 12 months You will receive a reminder letter in the mail two months in advance. If you don't receive a letter, please call our office to schedule the follow-up appointment.  

## 2011-06-04 NOTE — Progress Notes (Signed)
HPI Patient is a 78 year old with a history of CAD (s/p NSTEMI 06/2005.  PTCA of OM at time), PAF, PVCs, dyslipidemia.  I saw him in December 2011  LDL at that time was 66, HDL 47.  Echo in February 1610:  LVEF 50 to 55%, mild TR Currently with cold  But otherwise doing well.  No CP.  Activity  Level is good.  Still driving truck 3 days per week No SOB> Last took metoprolol on Friday.  Allergies  Allergen Reactions  . Tetanus Toxoids     Tetanus shot    Current Outpatient Prescriptions  Medication Sig Dispense Refill  . aspirin EC 325 MG tablet Take 325 mg by mouth daily.        . metoprolol succinate (TOPROL-XL) 25 MG 24 hr tablet Take 25 mg by mouth daily.        . nitroGLYCERIN (NITROSTAT) 0.4 MG SL tablet Place 0.4 mg under the tongue every 5 (five) minutes as needed.        . simvastatin (ZOCOR) 80 MG tablet Take 1 tablet (80 mg total) by mouth at bedtime. Take 1 tab daily as directed  30 tablet  6    Past Medical History  Diagnosis Date  . Non-ST elevated myocardial infarction (non-STEMI)   . Bradycardia     relative, precluding up titration of his beta-blocker  . Syncope     hx  . Atrial fibrillation     paroxysmal  . CAD (coronary artery disease)   . PVC's (premature ventricular contractions)     Past Surgical History  Procedure Date  . Cardiac catheterization   . Coronary arteriogram   . Left ventriculogram   . Reperfusion of obtuse marginal with a wife alone     No family history on file.  History   Social History  . Marital Status: Married    Spouse Name: N/A    Number of Children: 3  . Years of Education: N/A   Occupational History  . Not on file.   Social History Main Topics  . Smoking status: Never Smoker   . Smokeless tobacco: Not on file  . Alcohol Use: No  . Drug Use: No  . Sexually Active: Not on file   Other Topics Concern  . Not on file   Social History Narrative   He is married, divorced, and now leaving with his ex-wife. Continues  to drive tractor-trailers.     Review of Systems:  All systems reviewed.  They are negative to the above problem except as previously stated.  Vital Signs: BP 107/70  Pulse 69  Ht 5\' 7"  (1.702 m)  Wt 186 lb (84.369 kg)  BMI 29.13 kg/m2  Physical Exam  HEENT:  Normocephalic, atraumatic. EOMI, PERRLA.  Neck: JVP is normal. No thyromegaly. No bruits.  Lungs: clear to auscultation. No rales no wheezes.  Heart: Regular rate and rhythm. Normal S1, S2. No S3.   No significant murmurs. PMI not displaced.  Abdomen:  Supple, nontender. Normal bowel sounds. No masses. No hepatomegaly.  Extremities:   Good distal pulses throughout. No lower extremity edema.  Musculoskeletal :moving all extremities.  Neuro:   alert and oriented x3.  CN II-XII grossly intact.  EKG:  SR.  69 bpm. T wave inversion III.  No change from previous.   Assessment and Plan:

## 2011-06-05 NOTE — Assessment & Plan Note (Addendum)
I have looked through all old holters.  He had skips but no afib. I would not Rx.  I would no longer define as an active problem.

## 2011-06-05 NOTE — Assessment & Plan Note (Signed)
Patient remains asypmptomatic.  Refill b blocker.

## 2011-06-05 NOTE — Assessment & Plan Note (Signed)
Will check lipids. 

## 2011-06-05 NOTE — Assessment & Plan Note (Signed)
No symptoms to suggest ischemia. 

## 2011-06-06 ENCOUNTER — Other Ambulatory Visit: Payer: Self-pay | Admitting: *Deleted

## 2011-06-06 DIAGNOSIS — I493 Ventricular premature depolarization: Secondary | ICD-10-CM

## 2011-06-22 ENCOUNTER — Encounter (INDEPENDENT_AMBULATORY_CARE_PROVIDER_SITE_OTHER): Payer: Medicare Other

## 2011-06-22 DIAGNOSIS — R002 Palpitations: Secondary | ICD-10-CM

## 2011-06-22 DIAGNOSIS — I493 Ventricular premature depolarization: Secondary | ICD-10-CM

## 2011-08-06 ENCOUNTER — Other Ambulatory Visit: Payer: Self-pay | Admitting: *Deleted

## 2011-08-06 MED ORDER — NITROGLYCERIN 0.4 MG SL SUBL: 0.4000 mg | SUBLINGUAL_TABLET | SUBLINGUAL | Status: DC | PRN

## 2011-10-01 ENCOUNTER — Telehealth: Payer: Self-pay | Admitting: *Deleted

## 2011-10-01 ENCOUNTER — Ambulatory Visit (INDEPENDENT_AMBULATORY_CARE_PROVIDER_SITE_OTHER): Payer: Medicare Other

## 2011-10-01 VITALS — HR 71

## 2011-10-01 DIAGNOSIS — I493 Ventricular premature depolarization: Secondary | ICD-10-CM

## 2011-10-01 DIAGNOSIS — R001 Bradycardia, unspecified: Secondary | ICD-10-CM

## 2011-10-01 DIAGNOSIS — I499 Cardiac arrhythmia, unspecified: Secondary | ICD-10-CM

## 2011-10-01 NOTE — Telephone Encounter (Signed)
Patient had EKG done in [place] office and reviewed by Dr.Ross. Echo to be scheduled in June because of frequent PVC's.

## 2011-10-01 NOTE — Telephone Encounter (Signed)
Received call from NP from [facility] who is at the patient's home for a general wellness visit. [name] [name] has advised that his apical heart rate is 32 with a BP of 106/60 in  One arm and 98/60 in the other arm. [name] [name] states that the patient is asymptomatic with this low heart rate. Discussed with Dr.Ross and she advised that he report to the [place] office for a 12 lead EKG and then she will review. [name] [name] will let patient know.

## 2012-01-17 ENCOUNTER — Encounter: Payer: Self-pay | Admitting: Physician Assistant

## 2012-01-17 ENCOUNTER — Ambulatory Visit: Payer: Self-pay | Admitting: Surgery

## 2012-01-17 ENCOUNTER — Ambulatory Visit (INDEPENDENT_AMBULATORY_CARE_PROVIDER_SITE_OTHER): Payer: Medicare Other | Admitting: Physician Assistant

## 2012-01-17 VITALS — BP 106/66 | HR 67 | Ht 67.0 in | Wt 173.0 lb

## 2012-01-17 DIAGNOSIS — I4949 Other premature depolarization: Secondary | ICD-10-CM

## 2012-01-17 DIAGNOSIS — E785 Hyperlipidemia, unspecified: Secondary | ICD-10-CM

## 2012-01-17 DIAGNOSIS — I2510 Atherosclerotic heart disease of native coronary artery without angina pectoris: Secondary | ICD-10-CM

## 2012-01-17 DIAGNOSIS — Z01810 Encounter for preprocedural cardiovascular examination: Secondary | ICD-10-CM

## 2012-01-17 LAB — BASIC METABOLIC PANEL
Chloride: 101 mmol/L (ref 98–107)
EGFR (Non-African Amer.): >60
Sodium: 137 mmol/L (ref 136–145)

## 2012-01-17 LAB — CBC WITH DIFFERENTIAL/PLATELET
Basophil #: 0 10*3/uL (ref 0.0–0.1)
Lymphocyte %: 35.8 %
Monocyte %: 13.6 %
Neutrophil %: 47.6 %
Platelet: 156 10*3/uL (ref 150–440)
RBC: 4.27 10*6/uL — ABNORMAL LOW (ref 4.40–5.90)
RDW: 12.1 % (ref 11.5–14.5)
WBC: 6 10*3/uL (ref 3.8–10.6)

## 2012-01-17 NOTE — Progress Notes (Signed)
[address] [place], [place]  [address] Phone: [phone] Fax:  [phone]  Date:  01/17/2012   Name:  [name] [name]   DOB:  05-31-1933   MRN:  [id number]  PCP:  Yates Decamp, MD  Primary Cardiologist:  Dr. Dietrich Pates  Primary Electrophysiologist:  None    History of Present Illness: [name] [name] is a 78 y.o. male who returns for surgical clearance.  He has a hx of CAD, s/p NSTEMI 2/07 => PTCA of OM, HL, reported parox AFib, PVCs.  LHC 2/07 demonstrated an occluded OM, mild nonobstructive disease in the LAD with 20 and 40% lesions and preserved LV function. He had return of perfusion in his OM with wire passage alone. No balloon angioplasty or stenting was required. Nuclear study 4/08: Prior lateral MI with mild peri-infarct ischemia. Echocardiogram 2/12: Mild LVH, EF 50-55%, grade 1 diastolic dysfunction, mild TR. Holter monitor 2/13: Sinus rhythm, frequent PVCs, couplets and triplets, occasional PACs. Last seen by Dr. Tenny Craw 1/13. At that time, she reviewed his chart and saw no evidence of atrial fibrillation and this was taken off of his active problem list. He needs bilateral inguinal hernia repair.   His surgery is scheduled for next week. He denies chest pain. He denies shortness of breath. He is very active 78 year old. He still works part-time driving a truck. He can move 40 pounds without difficulty. He can climb several flights of steps without chest pain or shortness of breath. He can back in the room to push mows yards without chest discomfort. He denies syncope. He denies palpitations. He denies orthopnea, PND or edema.  Wt Readings from Last 3 Encounters:  01/17/12 173 lb (78.472 kg)  06/04/11 186 lb (84.369 kg)  04/28/10 183 lb (83.008 kg)     Past Medical History  Diagnosis Date  . Non-ST elevated myocardial infarction (non-STEMI)     2/07: LHC with occluded OM, LAD 20% + 40%; normal LVF => PTCA of OM with wire passage alone   .  Bradycardia     relative, precluding up titration of his beta-blocker  . Syncope     hx  . CAD (coronary artery disease)   . PVC's (premature ventricular contractions)   . HLD (hyperlipidemia)     Current Outpatient Prescriptions  Medication Sig Dispense Refill  . aspirin EC 325 MG tablet Take 325 mg by mouth daily.        . metoprolol succinate (TOPROL-XL) 25 MG 24 hr tablet Take 1 tablet (25 mg total) by mouth daily.  30 tablet  12  . nitroGLYCERIN (NITROSTAT) 0.4 MG SL tablet Place 1 tablet (0.4 mg total) under the tongue every 5 (five) minutes as needed.  25 tablet  6  . simvastatin (ZOCOR) 80 MG tablet Take 1 tablet (80 mg total) by mouth at bedtime. Take 1 tab daily as directed  30 tablet  12    Allergies: Allergies  Allergen Reactions  . Tetanus Toxoids     Tetanus shot    History  Substance Use Topics  . Smoking status: Never Smoker   . Smokeless tobacco: Not on file  . Alcohol Use: No     ROS:  Please see the history of present illness.   He sometimes gets dizzy with standing abruptly.   All other systems reviewed and negative.   PHYSICAL EXAM: VS:  BP 106/66  Pulse 67  Ht 5\' 7"  (1.702 m)  Wt 173 lb (78.472 kg)  BMI  27.10 kg/m2 Well nourished, well developed, in no acute distress HEENT: normal Neck: no JVD Vascular: No carotid bruits Cardiac:  normal S1, S2; RRR; no murmur Lungs:  clear to auscultation bilaterally, no wheezing, rhonchi or rales Abd: soft, nontender, no hepatomegaly Ext: no edema Skin: warm and dry Neuro:  CNs 2-12 intact, no focal abnormalities noted  EKG:  Sinus rhythm with ventricular bigeminy, heart rate 67, no significant change when compared to prior tracing  ASSESSMENT AND PLAN:  1. Coronary Artery Disease:  The patient does not have any unstable cardiac conditions.  The patient can achieve 4 METs or greater without anginal symptoms.  According to [facility] and AHA guidelines, the patient requires no further cardiac workup prior to their  noncardiac surgery.  The patient should be at acceptable risk.  He should remain on his beta blocker throughout the perioperative period to reduce cardiovascular risk. Followup with Dr. Tenny Craw in 3 months.  2. Hyperlipidemia:  Last lipids done 2/13 with LDL 64.  Continue Zocor.  3. PVCs:  This is a chronic problem. He is asymptomatic. He's had several Holter monitors in the past. Ejection fraction is preserved.  No further workup at this time.  Signed, Tereso Newcomer, PA-C  2:38 PM 01/17/2012

## 2012-01-17 NOTE — Patient Instructions (Addendum)
Your physician recommends that you schedule a follow-up appointment in 3 months with Dr.Ross  

## 2012-01-24 ENCOUNTER — Ambulatory Visit: Payer: Self-pay | Admitting: Surgery

## 2012-04-11 ENCOUNTER — Ambulatory Visit: Payer: Medicare Other | Admitting: Internal Medicine

## 2012-06-02 ENCOUNTER — Encounter: Payer: Self-pay | Admitting: Internal Medicine

## 2012-06-02 ENCOUNTER — Ambulatory Visit (INDEPENDENT_AMBULATORY_CARE_PROVIDER_SITE_OTHER): Payer: Medicare Other | Admitting: Internal Medicine

## 2012-06-02 VITALS — BP 112/70 | HR 78 | Ht 67.0 in | Wt 171.0 lb

## 2012-06-02 DIAGNOSIS — E7800 Pure hypercholesterolemia, unspecified: Secondary | ICD-10-CM

## 2012-06-02 DIAGNOSIS — I2584 Coronary atherosclerosis due to calcified coronary lesion: Secondary | ICD-10-CM

## 2012-06-02 NOTE — Progress Notes (Signed)
HPI [name] [name] is a 79 yo with  a hx of CAD, s/p NSTEMI . LHC 2/07 demonstrated an occluded OM, mild nonobstructive disease in the LAD with 20 and 40% lesions and preserved LV function. He had return of perfusion in his OM with wire passage alone. No balloon angioplasty or stenting was required.  Nuclear study 4/08: Prior lateral MI with mild peri-infarct ischemia. Echocardiogram 2/12: Mild LVH, EF 50-55%, grade 1 diastolic dysfunction, mild TR. Holter monitor 2/13: Sinus rhythm, frequent PVCs, couplets and triplets, occasional PACs.  I saw him in Jan 2013.  Wende Mott saw him in Aug as preop for hernia surgery   SInce seen the patient has done well  He denies CP  Breathing is OK  He remains very active. Also a history of HL  Allergies  Allergen Reactions  . Tetanus Toxoids     Tetanus shot    Current Outpatient Prescriptions  Medication Sig Dispense Refill  . aspirin EC 325 MG tablet Take 325 mg by mouth daily.        . metoprolol succinate (TOPROL-XL) 25 MG 24 hr tablet Take 1 tablet (25 mg total) by mouth daily.  30 tablet  12  . nitroGLYCERIN (NITROSTAT) 0.4 MG SL tablet Place 1 tablet (0.4 mg total) under the tongue every 5 (five) minutes as needed.  25 tablet  6  . simvastatin (ZOCOR) 80 MG tablet Take 1 tablet (80 mg total) by mouth at bedtime. Take 1 tab daily as directed  30 tablet  12    Past Medical History  Diagnosis Date  . Non-ST elevated myocardial infarction (non-STEMI)     2/07: LHC with occluded OM, LAD 20% + 40%; normal LVF => PTCA of OM with wire passage alone   . Bradycardia     relative, precluding up titration of his beta-blocker  . Syncope     hx  . CAD (coronary artery disease)   . PVC's (premature ventricular contractions)   . HLD (hyperlipidemia)     Past Surgical History  Procedure Date  . Cardiac catheterization   . Coronary arteriogram   . Left ventriculogram   . Reperfusion of obtuse marginal with a wife alone     No family history on  file.  History   Social History  . Marital Status: Married    Spouse Name: N/A    Number of Children: 3  . Years of Education: N/A   Occupational History  . Not on file.   Social History Main Topics  . Smoking status: Never Smoker   . Smokeless tobacco: Not on file  . Alcohol Use: No  . Drug Use: No  . Sexually Active: Not on file   Other Topics Concern  . Not on file   Social History Narrative   He is married, divorced, and now leaving with his ex-wife. Continues to drive tractor-trailers.     Review of Systems:  All systems reviewed.  They are negative to the above problem except as previously stated.  Vital Signs: BP 112/70  Pulse 78  Ht 5\' 7"  (1.702 m)  Wt 171 lb (77.565 kg)  BMI 26.78 kg/m2  Physical Exam Patient is in NAD HEENT:  Normocephalic, atraumatic. EOMI, PERRLA.  Neck: JVP is normal.  No bruits.  Lungs: clear to auscultation. No rales no wheezes.  Heart: Regular rate and rhythm. Normal S1, S2. No S3.   No significant murmurs. PMI not displaced.  Abdomen:  Supple, nontender. Normal bowel sounds. No  masses. No hepatomegaly.  Extremities:   Good distal pulses throughout. No lower extremity edema.  Musculoskeletal :moving all extremities.  Neuro:   alert and oriented x3.  CN II-XII grossly intact.  EKG  SR 78 bpm.   Assessment and Plan: 1.  CAD  No symptoms of active angina  Keep on same regimen  Stay active.  2.  HL  Will check fasting lipids today  3.  HTN  BP is good.    F/U in 1 year.  Enouraged him again to remain avtive.

## 2012-06-03 LAB — CBC WITH DIFFERENTIAL/PLATELET
Basophils Relative: 0.1 % (ref 0.0–3.0)
HCT: 41.5 % (ref 39.0–52.0)
Hemoglobin: 14.1 g/dL (ref 13.0–17.0)
Lymphocytes Relative: 19.4 % (ref 12.0–46.0)
Lymphs Abs: 2.4 10*3/uL (ref 0.7–4.0)
Monocytes Relative: 8.6 % (ref 3.0–12.0)
Neutro Abs: 8.9 10*3/uL — ABNORMAL HIGH (ref 1.4–7.7)
RBC: 4.2 Mil/uL — ABNORMAL LOW (ref 4.22–5.81)
RDW: 11.9 % (ref 11.5–14.6)

## 2012-06-03 LAB — BASIC METABOLIC PANEL
BUN: 19 mg/dL (ref 6–23)
CO2: 34 mEq/L — ABNORMAL HIGH (ref 19–32)
Calcium: 9.5 mg/dL (ref 8.4–10.5)
Chloride: 100 mEq/L (ref 96–112)
Creatinine, Ser: 0.7 mg/dL (ref 0.4–1.5)
Glucose, Bld: 91 mg/dL (ref 70–99)

## 2012-06-03 LAB — LIPID PANEL
Cholesterol: 140 mg/dL (ref 0–200)
LDL Cholesterol: 66 mg/dL (ref 0–99)
Total CHOL/HDL Ratio: 2
VLDL: 15.6 mg/dL (ref 0.0–40.0)

## 2012-06-04 ENCOUNTER — Telehealth: Payer: Self-pay | Admitting: Cardiology

## 2012-06-04 NOTE — Telephone Encounter (Signed)
Lab results were given to Mrs Senne.

## 2012-06-04 NOTE — Telephone Encounter (Signed)
PT WIFE WOULD LIKE RESULTS OF LAB WORK

## 2012-06-30 ENCOUNTER — Other Ambulatory Visit: Payer: Self-pay | Admitting: Internal Medicine

## 2013-02-06 ENCOUNTER — Encounter: Payer: Self-pay | Admitting: Internal Medicine

## 2013-02-06 ENCOUNTER — Ambulatory Visit (INDEPENDENT_AMBULATORY_CARE_PROVIDER_SITE_OTHER): Payer: Medicare Other | Admitting: Internal Medicine

## 2013-02-06 VITALS — BP 132/95 | HR 57 | Ht 67.0 in | Wt 167.0 lb

## 2013-02-06 DIAGNOSIS — I2584 Coronary atherosclerosis due to calcified coronary lesion: Secondary | ICD-10-CM

## 2013-02-06 MED ORDER — NITROGLYCERIN 0.4 MG SL SUBL: 0.4000 mg | SUBLINGUAL_TABLET | SUBLINGUAL | Status: DC | PRN

## 2013-02-06 MED ORDER — SILDENAFIL CITRATE 50 MG PO TABS: 50.0000 mg | ORAL_TABLET | Freq: Every day | ORAL | Status: DC | PRN

## 2013-02-06 NOTE — Patient Instructions (Addendum)
Stop Simvastatin( Call office back to to report how you are doing )   Start NTG as directed for chest pain   Start Viagra if needed   Your physician wants you to follow-up in: 7/15 You will receive a reminder letter in the mail two months in advance. If you don't receive a letter, please call our office to schedule the follow-up appointment. 1

## 2013-02-06 NOTE — Progress Notes (Signed)
HPI [name] [name] is a 79 yo with  a hx of CAD, s/p NSTEMI . LHC 2/07 demonstrated an occluded OM, mild nonobstructive disease in the LAD with 20 and 40% lesions and preserved LV function. [name] [name] had return of perfusion in his OM with wire passage alone. No balloon angioplasty or stenting was required.  Nuclear study 4/08: Prior lateral MI with mild peri-infarct ischemia. Echocardiogram 2/12: Mild LVH, EF 50-55%, grade 1 diastolic dysfunction, mild TR. Holter monitor 2/13: Sinus rhythm, frequent PVCs, couplets and triplets, occasional PACs.  I saw him in Jan 2014.  Notes [name] [name] is achy in joints and lmuscles  Question meds No CP  No SOB  Walks alot  Allergies  Allergen Reactions  . Tetanus Toxoids     Tetanus shot    Current Outpatient Prescriptions  Medication Sig Dispense Refill  . aspirin EC 325 MG tablet Take 325 mg by mouth daily.        . metoprolol succinate (TOPROL-XL) 25 MG 24 hr tablet take 1 tablet by mouth once daily  30 tablet  12  . nitroGLYCERIN (NITROSTAT) 0.4 MG SL tablet Place 1 tablet (0.4 mg total) under the tongue every 5 (five) minutes as needed.  25 tablet  6  . simvastatin (ZOCOR) 80 MG tablet take 1 tablet by mouth at bedtime as directed  30 tablet  12   No current facility-administered medications for this visit.    Past Medical History  Diagnosis Date  . Non-ST elevated myocardial infarction (non-STEMI)     2/07: LHC with occluded OM, LAD 20% + 40%; normal LVF => PTCA of OM with wire passage alone   . Bradycardia     relative, precluding up titration of his beta-blocker  . Syncope     hx  . CAD (coronary artery disease)   . PVC's (premature ventricular contractions)   . HLD (hyperlipidemia)     Past Surgical History  Procedure Laterality Date  . Cardiac catheterization    . Coronary arteriogram    . Left ventriculogram    . Reperfusion of obtuse marginal with a wife alone      No family history on file.  History   Social History  . Marital Status:  Married    Spouse Name: N/A    Number of Children: 3  . Years of Education: N/A   Occupational History  . Not on file.   Social History Main Topics  . Smoking status: Never Smoker   . Smokeless tobacco: Not on file  . Alcohol Use: No  . Drug Use: No  . Sexual Activity: Not on file   Other Topics Concern  . Not on file   Social History Narrative   [name] [name] is married, divorced, and now leaving with his ex-wife. Continues to drive tractor-trailers.     Review of Systems:  All systems reviewed.  They are negative to the above problem except as previously stated.  Vital Signs: BP 132/95  Pulse 57  Ht 5\' 7"  (1.702 m)  Wt 167 lb (75.751 kg)  BMI 26.15 kg/m2 BP on my check 120/65 L arm  130/70 L arm.  Physical Exam Patient is in NAD HEENT:  Normocephalic, atraumatic. EOMI, PERRLA.  Neck: JVP is normal.  No bruits.  Lungs: clear to auscultation. No rales no wheezes.  Heart: Regular rate and rhythm. Normal S1, S2. No S3.   No significant murmurs. PMI not displaced.  Abdomen:  Supple, nontender. Normal bowel sounds. No masses. No hepatomegaly.  Extremities:   Good distal pulses throughout. No lower extremity edema.  Musculoskeletal :moving all extremities.  Neuro:   alert and oriented x3.  CN II-XII grossly intact.  EKG  SB  57   Assessment and Plan: 1.  CAD  No symptoms of active angina  Keep on same regimen  Stay active.  2.  HL  Labs good in [place]. I have told him to hold statin for 1 wk  See if symptoms change  [name] [name] will call back with response  If no change then resume.  Plan f/u labs next summer.  If change meds will have to f/u sooner.  3.  HTN  BP is good    F/U in 1 year.  Enouraged him again to remain avtive.

## 2013-03-11 ENCOUNTER — Other Ambulatory Visit: Payer: Self-pay | Admitting: *Deleted

## 2013-03-11 MED ORDER — SIMVASTATIN 80 MG PO TABS: 80.0000 mg | ORAL_TABLET | Freq: Every day | ORAL | Status: DC

## 2013-07-25 ENCOUNTER — Other Ambulatory Visit: Payer: Self-pay | Admitting: Internal Medicine

## 2013-10-30 ENCOUNTER — Ambulatory Visit (INDEPENDENT_AMBULATORY_CARE_PROVIDER_SITE_OTHER): Payer: Medicare Other | Admitting: Internal Medicine

## 2013-10-30 ENCOUNTER — Encounter: Payer: Self-pay | Admitting: Internal Medicine

## 2013-10-30 VITALS — BP 102/72 | HR 63 | Ht 67.0 in | Wt 170.0 lb

## 2013-10-30 DIAGNOSIS — E785 Hyperlipidemia, unspecified: Secondary | ICD-10-CM

## 2013-10-30 DIAGNOSIS — I2510 Atherosclerotic heart disease of native coronary artery without angina pectoris: Secondary | ICD-10-CM

## 2013-10-30 DIAGNOSIS — I4891 Unspecified atrial fibrillation: Secondary | ICD-10-CM

## 2013-10-30 NOTE — Patient Instructions (Signed)
Your physician recommends that you continue on your current medications as directed. Please refer to the Current Medication list given to you today.  Your physician wants you to follow-up in: 1 YEAR with Dr Tenny Crawoss.  You will receive a reminder letter in the mail two months in advance. If you don't receive a letter, please call our office to schedule the follow-up appointment.  Order given to the patient to have a CBC, BMP, LIPID and AST drawn with primary care physician.  Instructions given to have these results faxed to our office.

## 2013-10-30 NOTE — Progress Notes (Signed)
HPI [name] [name] is a 80 yo with  a hx of CAD, s/p NSTEMI . LHC 2/07 demonstrated an occluded OM, mild nonobstructive disease in the LAD with 20 and 40% lesions and preserved LV function. He had return of perfusion in his OM with wire passage alone. No balloon angioplasty or stenting was required.  Nuclear study 4/08: Prior lateral MI with mild peri-infarct ischemia. Echocardiogram 2/12: Mild LVH, EF 50-55%, grade 1 diastolic dysfunction, mild TR. Holter monitor 2/13: Sinus rhythm, frequent PVCs, couplets and triplets, occasional PACs.   I saw him in Fall 2014 No dizziness  No SOB  No CP  Driving truck parttime  Chores around house.  Just built deck   No palpitations  Due for labs in July    Allergies  Allergen Reactions  . Tetanus Toxoids     Tetanus shot    Current Outpatient Prescriptions  Medication Sig Dispense Refill  . aspirin EC 325 MG tablet Take 325 mg by mouth daily.        . metoprolol succinate (TOPROL-XL) 25 MG 24 hr tablet take 1 tablet by mouth once daily  30 tablet  6  . nitroGLYCERIN (NITROSTAT) 0.4 MG SL tablet Place 1 tablet (0.4 mg total) under the tongue every 5 (five) minutes as needed.  25 tablet  6  . sildenafil (VIAGRA) 50 MG tablet Take 1 tablet (50 mg total) by mouth daily as needed for erectile dysfunction.  10 tablet  1  . simvastatin (ZOCOR) 80 MG tablet Take 1 tablet (80 mg total) by mouth at bedtime.  90 tablet  1   No current facility-administered medications for this visit.    Past Medical History  Diagnosis Date  . Non-ST elevated myocardial infarction (non-STEMI)     2/07: LHC with occluded OM, LAD 20% + 40%; normal LVF => PTCA of OM with wire passage alone   . Bradycardia     relative, precluding up titration of his beta-blocker  . Syncope     hx  . CAD (coronary artery disease)   . PVC's (premature ventricular contractions)   . HLD (hyperlipidemia)     Past Surgical History  Procedure Laterality Date  . Cardiac catheterization    .  Coronary arteriogram    . Left ventriculogram    . Reperfusion of obtuse marginal with a wife alone      No family history on file.  History   Social History  . Marital Status: Married    Spouse Name: N/A    Number of Children: 3  . Years of Education: N/A   Occupational History  . Not on file.   Social History Main Topics  . Smoking status: Never Smoker   . Smokeless tobacco: Not on file  . Alcohol Use: No  . Drug Use: No  . Sexual Activity: Not on file   Other Topics Concern  . Not on file   Social History Narrative   He is married, divorced, and now leaving with his ex-wife. Continues to drive tractor-trailers.     Review of Systems:  All systems reviewed.  They are negative to the above problem except as previously stated.  Vital Signs: BP 102/72  Pulse 63  Ht 5\' 7"  (1.702 m)  Wt 170 lb (77.111 kg)  BMI 26.62 kg/m2 BP on my check 120/65 L arm  130/70 L arm.  Physical Exam Patient is in NAD HEENT:  Normocephalic, atraumatic. EOMI, PERRLA.  Neck: JVP is normal.  No bruits.  Lungs: clear to auscultation. No rales no wheezes.  Heart: Regular rate and rhythm. Normal S1, S2. No S3.   No significant murmurs. PMI not displaced.  Abdomen:  Supple, nontender. Normal bowel sounds. No masses. No hepatomegaly.  Extremities:   Good distal pulses throughout. No lower extremity edema.  Musculoskeletal :moving all extremities.  Neuro:   alert and oriented x3.  CN II-XII grossly intact.  EKG  SR 63 bpm  Occasional PVC   Assessment and Plan: 1.  CAD  No symptoms of active angina  Keep on same regimen  Stay active.  2.  HL  Get labs in July  Taking 40 now.  Achienss gone  3.  HTN  BP is good  No dizziess  F/U in 1 year.  Enouraged him again to remain avtive.

## 2013-12-01 ENCOUNTER — Telehealth: Payer: Self-pay | Admitting: Internal Medicine

## 2013-12-01 NOTE — Telephone Encounter (Signed)
Pt Signed ROI to Have records sent to [employer], I called pt to Verify Before Sending these records And he Stated he No longer needed these records to be Sent he was made aware the Release will be placed In shred Bin Pt Ok'd  7.14.15/km

## 2014-05-09 ENCOUNTER — Other Ambulatory Visit: Payer: Self-pay | Admitting: Internal Medicine

## 2014-05-10 NOTE — Telephone Encounter (Signed)
Should this still be 40mg ? His lipids are managed by his pcp, so I am unsure. Please advise. Thanks, MI

## 2014-05-19 ENCOUNTER — Other Ambulatory Visit: Payer: Self-pay | Admitting: Internal Medicine

## 2014-05-19 MED ORDER — METOPROLOL SUCCINATE ER 25 MG PO TB24: 25.0000 mg | ORAL_TABLET | Freq: Every day | ORAL | Status: DC

## 2014-05-26 NOTE — Telephone Encounter (Signed)
Yes, according to last OV with Dr. Threasa Beardsoss--40 mg daily.  Thanks.

## 2014-05-27 ENCOUNTER — Other Ambulatory Visit: Payer: Self-pay | Admitting: *Deleted

## 2014-05-27 MED ORDER — SIMVASTATIN 40 MG PO TABS: 40.0000 mg | ORAL_TABLET | Freq: Every day | ORAL | Status: DC

## 2014-06-10 ENCOUNTER — Ambulatory Visit: Payer: Self-pay | Admitting: Ophthalmology

## 2014-06-22 ENCOUNTER — Ambulatory Visit: Payer: Self-pay | Admitting: Ophthalmology

## 2014-07-11 DIAGNOSIS — I10 Essential (primary) hypertension: Secondary | ICD-10-CM

## 2014-07-20 ENCOUNTER — Ambulatory Visit: Payer: Self-pay | Admitting: Ophthalmology

## 2014-08-09 ENCOUNTER — Encounter: Payer: Self-pay | Admitting: Internal Medicine

## 2014-09-07 NOTE — Op Note (Signed)
PATIENT NAME:  [name] [name], [name] [name] MR#:  [id number] DATE OF BIRTH:  [birth year]  DATE OF PROCEDURE:  01/24/2012  PREOPERATIVE DIAGNOSIS: Bilateral inguinal hernias.   POSTOPERATIVE DIAGNOSIS: Bilateral inguinal hernias.   PROCEDURE: Laparoscopic bilateral inguinal hernia repairs.   SURGEON: Renda Rolls, M.D.   ANESTHESIA: General.   INDICATIONS: This 81 year old male came in with chief complaint of bulging in the left groin. He had physical findings of bilateral inguinal hernias. He had had previous open inguinal hernia repairs 30 or more years ago. Surgery was recommended for definitive treatment.   DESCRIPTION OF PROCEDURE: The patient was placed on the operating table in the supine position under general endotracheal anesthesia. The abdomen was prepared with ChloraPrep and draped in a sterile manner. A transversely oriented infraumbilical incision was made, oriented slightly to the left of the midline and carried down through subcutaneous tissues to the left anterior rectus sheath, which is incised transversely to expose the medial border of the left rectus muscle. The muscle was retracted to expose the posterior sheath. The balloon dissector cannula was inserted and passed down to touch the pubic tubercle. The balloon was inflated. Viewing the inflation with the 10 mm, 25 degrees laparoscope, the inflation was symmetrical. Anatomic structures were reviewed. The balloon was then deflated, removed and replaced with a 30 mL balloon cannula which was inflated and secured. Next, the properitoneal space was insufflated with carbon dioxide as the laparoscope was introduced. Muscle was identified bilaterally and also the sites of the hernias were identified. Inferior epigastric vessels identified. Next, another incision was made in the left mid abdomen to introduce a 5 mm cannula which is introduced at the site where the transversus abdominis was viewed. Another incision was made on the right to insert a 5 mm  cannula in a similar fashion.   First on the left side, the left inguinal hernia was identified. This was a direct inguinal hernia. The sac was dissected away from the attenuated transversalis fascia and retracted back away from the abdominal wall. Cooper's ligament was identified. Circumferential dissection was carried out around the inferior epigastric vessels. There appeared to be no indirect component. Next, the Atrium mesh was cut to create an oval shape of some 3 x 5 inches with a posterior oblique slit and a small notch anteriorly. This was inserted and passed around the inferior epigastric vessels. The slit was overlapped and closed with a spiral tacker. The mesh was positioned over the defect. The defect itself was some 2 cm in dimension. The mesh was attached to the Cooper's ligament, rectus muscle, and transversalis muscle and fascia with a row of tacks carried laterally to the ileopubic tract. The repair looked good. Attention was turned to the right side where direct inguinal hernia was identified. It was somewhat smaller, about 1.2 cm. The sac was dissected back away from the abdominal wall. The anatomic structures were identified. A similar mesh was inserted and passed around the inferior epigastric vessels, positioned over the defect and attached to the Cooper's ligament, rectus muscle, and transversalis fascia with a row of tacks carried laterally to the iliopubic tract.   Following this, I could see that both repairs looked good. Hemostasis was intact. The cannulas were removed allowing carbon dioxide to escape from the peritoneal cavity. The fascial defect below the umbilicus was closed with interrupted 0 Vicryl figure-of-eight sutures. The skin incisions were closed with 5-0 Monocryl subcuticular sutures and Dermabond. The patient tolerated surgery satisfactorily and was then prepared for transfer to  the recovery room.  ____________________________ Shela CommonsJ. Renda RollsWilton Smith,  MD jws:ap D: 01/24/2012 10:35:59 ET T: 01/24/2012 11:40:57 ET JOB#: [id number]  cc: Adella HareJ. Wilton Smith, MD, <Dictator> Adella HareWILTON J SMITH MD ELECTRONICALLY SIGNED 01/24/2012 19:05

## 2014-09-19 NOTE — Op Note (Signed)
PATIENT NAME:  [name] [name], [name] [name] MR#:  [id number] DATE OF BIRTH:  1934-03-25  DATE OF PROCEDURE:  06/22/2014  PREOPERATIVE DIAGNOSIS:  Nuclear sclerotic cataract of the right eye.   POSTOPERATIVE DIAGNOSIS:  Nuclear sclerotic cataract of the right eye.   OPERATIVE PROCEDURE:  Cataract extraction by phacoemulsification with implant of intraocular lens to right eye.   SURGEON:  Galen ManilaWilliam Nayra Coury, M.D.   ANESTHESIA:  1. Managed anesthesia care.  2. Topical tetracaine drops followed by 2% Xylocaine jelly applied in the preoperative holding area.   COMPLICATIONS:  None.   TECHNIQUE:   Stop and chop.   DESCRIPTION OF PROCEDURE:  The patient was examined and consented in the preoperative holding area where the aforementioned topical anesthesia was applied to the right eye and then brought back to the Operating Room where the right eye was prepped and draped in the usual sterile ophthalmic fashion and a lid speculum was placed. A paracentesis was created with the side port blade and the anterior chamber was filled with viscoelastic. A near clear corneal incision was performed with the steel keratome. A continuous curvilinear capsulorrhexis was performed with a cystotome followed by the capsulorrhexis forceps. Hydrodissection and hydrodelineation were carried out with BSS on a blunt cannula. The lens was removed in a stop and chop technique and the remaining cortical material was removed with the irrigation-aspiration handpiece. The capsular bag was inflated with viscoelastic and the Tecnis ZCB00 23.5-diopter lens, serial number [id number], was placed in the capsular bag without complication. The remaining viscoelastic was removed from the eye with the irrigation-aspiration handpiece. The wounds were hydrated. The anterior chamber was flushed with Miostat and the eye was inflated to physiologic pressure. 0.1 mL of cefuroxime concentration 10 mg/mL was placed in the anterior chamber. The wounds were found to  be water tight. The eye was dressed with Vigamox. The patient was given protective glasses to wear throughout the day and a shield with which to sleep tonight. The patient was also given drops with which to begin a drop regimen today and will follow-up with me in one day.    ____________________________ Jerilee FieldWilliam L. Diego Ulbricht, MD wlp:JT D: 06/22/2014 13:11:54 ET T: 06/22/2014 13:57:52 ET JOB#: [id number]  cc: Deseri Loss L. Wrenley Sayed, MD, <Dictator> Jerilee FieldWILLIAM L Kharis Lapenna MD ELECTRONICALLY SIGNED 06/23/2014 13:56

## 2014-09-19 NOTE — Op Note (Signed)
PATIENT NAME:  [name] [name], [name] [name] MR#:  [id number] DATE OF BIRTH:  05-Feb-1934  DATE OF PROCEDURE:  07/20/2014  PREOPERATIVE DIAGNOSIS:  Nuclear sclerotic cataract of the left eye.   POSTOPERATIVE DIAGNOSIS:  Nuclear sclerotic cataract of the left eye.   OPERATIVE PROCEDURE:  Cataract extraction by phacoemulsification with implant of intraocular lens to left eye.   SURGEON:  Galen ManilaWilliam Jessamine Barcia, MD.   ANESTHESIA:  1. Managed anesthesia care.  2. Topical tetracaine drops followed by 2% Xylocaine jelly applied in the preoperative holding area.   COMPLICATIONS:  None.   TECHNIQUE:   Stop and chop.   DESCRIPTION OF PROCEDURE:  The patient was examined and consented in the preoperative holding area where the aforementioned topical anesthesia was applied to the left eye and then brought back to the Operating Room where the left eye was prepped and draped in the usual sterile ophthalmic fashion and a lid speculum was placed. A paracentesis was created with the side port blade and the anterior chamber was filled with viscoelastic. A near clear corneal incision was performed with the steel keratome. A continuous curvilinear capsulorrhexis was performed with a cystotome followed by the capsulorrhexis forceps. Hydrodissection and hydrodelineation were carried out with BSS on a blunt cannula. The lens was removed in a stop and chop technique and the remaining cortical material was removed with the irrigation-aspiration handpiece. The capsular bag was inflated with viscoelastic and the Tecnis ZCB00 24.0 -diopter lens, serial number [id number] was placed in the capsular bag without complication. The remaining viscoelastic was removed from the eye with the irrigation-aspiration handpiece. The wounds were hydrated. The anterior chamber was flushed with Miostat and the eye was inflated to physiologic pressure. 0.1 mL of cefuroxime concentration 10 mg/mL was placed in the anterior chamber. The wounds were found to be  water tight. The eye was dressed with Vigamox. The patient was given protective glasses to wear throughout the day and a shield with which to sleep tonight. The patient was also given drops with which to begin a drop regimen today and will follow-up with me in one day.    ____________________________ Jerilee FieldWilliam L. Lovie Zarling, MD wlp:mc D: 07/20/2014 21:15:08 ET T: 07/21/2014 09:03:50 ET JOB#: [id number]  cc: Kalayna Noy L. Edilia Ghuman, MD, <Dictator> Jerilee FieldWILLIAM L Jayle Solarz MD ELECTRONICALLY SIGNED 07/21/2014 11:26

## 2014-11-07 ENCOUNTER — Other Ambulatory Visit: Payer: Self-pay | Admitting: Internal Medicine

## 2014-11-08 ENCOUNTER — Other Ambulatory Visit: Payer: Self-pay

## 2014-11-08 MED ORDER — SIMVASTATIN 40 MG PO TABS: 40.0000 mg | ORAL_TABLET | Freq: Every day | ORAL | Status: DC

## 2014-11-09 ENCOUNTER — Encounter: Payer: Self-pay | Admitting: *Deleted

## 2014-11-09 ENCOUNTER — Telehealth: Payer: Self-pay | Admitting: *Deleted

## 2014-11-09 NOTE — Telephone Encounter (Signed)
called for fm hx & status, spoke with pt and completed chart.

## 2014-11-11 ENCOUNTER — Ambulatory Visit (INDEPENDENT_AMBULATORY_CARE_PROVIDER_SITE_OTHER): Payer: Medicare Other | Admitting: Internal Medicine

## 2014-11-11 ENCOUNTER — Encounter: Payer: Self-pay | Admitting: Internal Medicine

## 2014-11-11 VITALS — BP 106/62 | HR 62 | Ht 67.0 in | Wt 173.6 lb

## 2014-11-11 DIAGNOSIS — I10 Essential (primary) hypertension: Secondary | ICD-10-CM

## 2014-11-11 DIAGNOSIS — E785 Hyperlipidemia, unspecified: Secondary | ICD-10-CM

## 2014-11-11 NOTE — Progress Notes (Signed)
Cardiology Office Note   Date:  11/11/2014   ID:  [name] [name], DOB 1933-12-29, MRN [id number]  PCP:  Rafael Bihari, MD  Cardiologist:   Dietrich Pates, MD   No chief complaint on file.  F/U of CAD     History of Present Illness: [name] [name] is a 81 y.o. male with a history of CAD, s/p NSTEMI . LHC 2/07 demonstrated an occluded OM, mild nonobstructive disease in the LAD with 20 and 40% lesions and preserved LV function. He had return of perfusion in his OM with wire passage alone. No balloon angioplasty or stenting was required.  Nuclear study 4/08: Prior lateral MI with mild peri-infarct ischemia. Echocardiogram 2/12: Mild LVH, EF 50-55%, grade 1 diastolic dysfunction, mild TR. Holter monitor 2/13: Sinus rhythm, frequent PVCs, couplets and triplets, occasional PACs.    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 aspirin EC 325 MG tablet Take 325 mg by mouth daily.      . metoprolol succinate (TOPROL-XL) 25 MG 24 hr tablet Take 1 tablet (25 mg total) by mouth daily. 30 tablet 6  . nitroGLYCERIN (NITROSTAT) 0.4 MG SL tablet Place 1 tablet (0.4 mg total) under the tongue every 5 (five) minutes as needed. 25 tablet 6  . sildenafil (VIAGRA) 50 MG tablet Take 1 tablet (50 mg total) by mouth daily as needed for erectile dysfunction. 10 tablet 1  . simvastatin (ZOCOR) 40 MG tablet Take 1 tablet (40 mg total) by mouth daily. 90 tablet 1   No current facility-administered medications for this visit.    Allergies:   Tetanus toxoids   Past Medical History  Diagnosis Date  . Non-ST elevated myocardial infarction (non-STEMI)     2/07: LHC with occluded OM, LAD 20% + 40%; normal LVF => PTCA of OM with wire passage alone   . Bradycardia     relative, precluding up titration  of his beta-blocker  . Syncope     hx  . CAD (coronary artery disease)   . PVC's (premature ventricular contractions)   . HLD (hyperlipidemia)     Past Surgical History  Procedure Laterality Date  . Cardiac catheterization    . Coronary arteriogram    . Left ventriculogram    . Reperfusion of obtuse marginal with a wife alone       Social History:  The patient  reports that he has never smoked. He has quit using smokeless tobacco. His smokeless tobacco use included Chew. He reports that he does not drink alcohol or use illicit drugs.   Family History:  The patient's family history includes Heart attack in his father and mother.    ROS:  Please see the history of present illness. All other systems are reviewed and  Negative to the above problem except as noted.    PHYSICAL EXAM: VS:  BP 106/62 mmHg  Pulse 62  Ht  (1.702 m)  Wt 173 lb 9.6 oz (78.744 kg)  BMI 27.18 kg/m2  SpO2 97%  GEN: Well nourished, well developed, in no acute distress HEENT: normal Neck: no JVD, carotid bruits, or masses Cardiac: RRR; no murmurs, rubs, or gallops,no edema  Respiratory:  clear  to auscultation bilaterally, normal work of breathing GI: soft, nontender, nondistended, + BS  No hepatomegaly  MS: no deformity Moving all extremities   Skin: warm and dry, no rash Neuro:  Strength and sensation are intact Psych: euthymic mood, full affect   EKG:  EKG is not  ordered today.   Lipid Panel    Component Value Date/Time   CHOL 140 06/02/2012 1742   TRIG 78.0 06/02/2012 1742   HDL 58.00 06/02/2012 1742   CHOLHDL 2 06/02/2012 1742   VLDL 15.6 06/02/2012 1742   LDLCALC 66 06/02/2012 1742      Wt Readings from Last 3 Encounters:  11/11/14 173 lb 9.6 oz (78.744 kg)  10/30/13 170 lb (77.111 kg)  02/06/13 167 lb (75.751 kg)      ASSESSMENT AND PLAN:  1  CAD No sympotms of angina Active 2.  Lipids  Wlll contact [facility] for recent labs  Will set f/u for Fall 2017       SignedDietrich Pates, MD  11/11/2014 11:51 PM    [facility] [address] [place], [place], [place]  [address] Phone: [phone]; Fax: [phone]

## 2014-11-11 NOTE — Patient Instructions (Signed)
Your physician recommends that you continue on your current medications as directed. Please refer to the Current Medication list given to you today. Your physician wants you to follow-up in: 15 MONTHS WITH DR Tenny Craw   You will receive a reminder letter in the mail two months in advance. If you don't receive a letter, please call our office to schedule the follow-up appointment.

## 2014-12-06 ENCOUNTER — Telehealth: Payer: Self-pay | Admitting: Internal Medicine

## 2014-12-06 NOTE — Telephone Encounter (Signed)
Spoke with pt and informed him that Dr. Tenny Crawoss had his paperwork in her folder. Informed pt that he would receive a call once papers are ready. Pt verbalized understanding and was in agreement with this plan.

## 2014-12-06 NOTE — Telephone Encounter (Signed)
New Message  Pt calling to follow up on paperwork that was supposed to be signed by Dr. Tenny Crawoss- concerning his return to work. Please call back and discuss.

## 2014-12-07 ENCOUNTER — Telehealth: Payer: Self-pay | Admitting: Internal Medicine

## 2014-12-07 NOTE — Telephone Encounter (Signed)
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

## 2014-12-07 NOTE — Telephone Encounter (Signed)
Informed pt that he would get a call as soon as paperwork is ready. Pt verbalized understanding.

## 2014-12-08 NOTE — Telephone Encounter (Signed)
Follow up    Pt is calling about DOT paperwork.  Is paperwork completed? Please call to discuss

## 2014-12-08 NOTE — Telephone Encounter (Signed)
Wife states that [place] and DOT told him that he would need a stress test before he could return to work. Informed wife that I would send this information to Dr. Tenny Crawoss for review and advisement on ordering a stress test. Wife verbalized understanding.

## 2014-12-08 NOTE — Telephone Encounter (Signed)
New Message  Pt wife calling to sched an ETT for the [employer]. Pt was made aware that no order was in the system and requested to speak w/ the RN. Please call back and discuss.

## 2014-12-08 NOTE — Telephone Encounter (Signed)
Spoke with patient and explained we needed an order for ETT before we could schedule  Will forward to Dr Tenny Crawoss for review

## 2015-01-06 ENCOUNTER — Other Ambulatory Visit: Payer: Self-pay | Admitting: Internal Medicine

## 2015-05-24 ENCOUNTER — Other Ambulatory Visit: Payer: Self-pay | Admitting: *Deleted

## 2015-05-24 MED ORDER — SIMVASTATIN 40 MG PO TABS: 40.0000 mg | ORAL_TABLET | Freq: Every day | ORAL | Status: DC

## 2015-08-17 ENCOUNTER — Other Ambulatory Visit: Payer: Self-pay | Admitting: Internal Medicine

## 2015-12-20 ENCOUNTER — Other Ambulatory Visit: Payer: Self-pay | Admitting: Internal Medicine

## 2016-01-08 ENCOUNTER — Other Ambulatory Visit: Payer: Self-pay | Admitting: Internal Medicine

## 2016-01-27 ENCOUNTER — Encounter: Payer: Self-pay | Admitting: Internal Medicine

## 2016-02-08 NOTE — Progress Notes (Signed)
Cardiology Office Note   Date:  02/10/2016   ID:  [name] [name], DOB 05/09/1934, MRN [id number]  PCP:  Rafael BihariWALKER III, JOHN B, MD  Cardiologist:   Dietrich PatesPaula Kenderick Kobler, MD   F/u of CAD     History of Present Illness: [name] Ramprnest B Hornbrook is a 82 y.o. male with a history of CAD, s/p NSTEMI . LHC 2/07 demonstrated an occluded OM, mild nonobstructive disease in the LAD with 20 and 40% lesions and preserved LV function. He had return of perfusion in his OM with wire passage alone. No balloon angioplasty or stenting was required.  Nuclear study 4/08: Prior lateral MI with mild peri-infarct ischemia. Echocardiogram 2/12: Mild LVH, EF 50-55%, grade 1 diastolic dysfunction, mild TR. Holter monitor 2/13: Sinus rhythm, frequent PVCs, couplets and triplets, occasional PACs.   I saw the pt in June 2016  Very active  No CP  No SOB  No dizziness  Occasional skip  Drives tructk  Part time    Outpatient Medications Prior to Visit  Medication Sig Dispense Refill  . aspirin EC 325 MG tablet Take 325 mg by mouth daily.      . metoprolol succinate (TOPROL-XL) 25 MG 24 hr tablet take 1 tablet by mouth once daily 30 tablet 11  . NITROSTAT 0.4 MG SL tablet place 1 tablet under the tongue if needed every 5 minutes for chest pain for 3 doses IF NO RELIEF AFTER 3RD DOSE CALL PRESCRIBER OR 911. 25 tablet 2  . sildenafil (VIAGRA) 50 MG tablet Take 1 tablet (50 mg total) by mouth daily as needed for erectile dysfunction. 10 tablet 1  . simvastatin (ZOCOR) 40 MG tablet take 1 tablet by mouth once daily 90 tablet 0  . nitroGLYCERIN (NITROSTAT) 0.4 MG SL tablet Place 1 tablet (0.4 mg total) under the tongue every 5 (five) minutes as needed. (Patient not taking: Reported on 02/10/2016) 25 tablet 6   No facility-administered medications prior to visit.      Allergies:   Other; Tetanus toxoid, adsorbed; and Tetanus toxoids   Past Medical History:  Diagnosis Date  . Bradycardia    relative, precluding up titration of his  beta-blocker  . CAD (coronary artery disease)   . HLD (hyperlipidemia)   . Non-ST elevated myocardial infarction (non-STEMI) (HCC)    2/07: LHC with occluded OM, LAD 20% + 40%; normal LVF => PTCA of OM with wire passage alone   . PVC's (premature ventricular contractions)   . Syncope    hx    Past Surgical History:  Procedure Laterality Date  . CARDIAC CATHETERIZATION    . coronary arteriogram    . left ventriculogram    . reperfusion of obtuse marginal with a wife alone       Social History:  The patient  reports that he has never smoked. He has quit using smokeless tobacco. His smokeless tobacco use included Chew. He reports that he does not drink alcohol or use drugs.   Family History:  The patient's family history includes Heart attack in his father and mother.    ROS:  Please see the history of present illness. All other systems are reviewed and  Negative to the above problem except as noted.    PHYSICAL EXAM: VS:  BP 130/70   Pulse 62   Ht 5\' 7"  (1.702 m)   Wt 178 lb (80.7 kg)   BMI 27.88 kg/m   GEN: Well nourished, well developed, in no acute distress HEENT: normal Neck:  no JVD, carotid bruits, or masses Cardiac: RRR; no murmurs, rubs, or gallops,no edema  Respiratory:  clear to auscultation bilaterally, normal work of breathing GI: soft, nontender, nondistended, + BS  No hepatomegaly  MS: no deformity Moving all extremities   Skin: warm and dry, no rash Neuro:  Strength and sensation are intact Psych: euthymic mood, full affect   EKG:  EKG is ordered today.  SR 60  Occasional PVC     Lipid Panel    Component Value Date/Time   CHOL 140 06/02/2012 1742   TRIG 78.0 06/02/2012 1742   HDL 58.00 06/02/2012 1742   CHOLHDL 2 06/02/2012 1742   VLDL 15.6 06/02/2012 1742   LDLCALC 66 06/02/2012 1742      Wt Readings from Last 3 Encounters:  02/10/16 178 lb (80.7 kg)  11/11/14 173 lb 9.6 oz (78.7 kg)  10/30/13 170 lb (77.1 kg)      ASSESSMENT AND  PLAN:  1  CAD  No symptoms of angina  Very active   Had stress test 1 year ago at [facility]   OK to continue commercial driving   2  HL  Will check lipids     F/u in 1 year      Current medicines are reviewed at length with the patient today.  The patient does not have concerns regarding medicines.  Signed, Dietrich Pates, MD  02/10/2016 10:19 AM    [facility] [address] [place], [place], [place]  [address] Phone: [phone]; Fax: [phone]

## 2016-02-10 ENCOUNTER — Encounter: Payer: Self-pay | Admitting: Internal Medicine

## 2016-02-10 ENCOUNTER — Ambulatory Visit (INDEPENDENT_AMBULATORY_CARE_PROVIDER_SITE_OTHER): Payer: Medicare Other | Admitting: Internal Medicine

## 2016-02-10 VITALS — BP 130/70 | HR 62 | Ht 67.0 in | Wt 178.0 lb

## 2016-02-10 DIAGNOSIS — E782 Mixed hyperlipidemia: Secondary | ICD-10-CM | POA: Diagnosis not present

## 2016-02-10 DIAGNOSIS — I10 Essential (primary) hypertension: Secondary | ICD-10-CM | POA: Diagnosis not present

## 2016-02-10 LAB — LIPID PANEL
CHOL/HDL RATIO: 2.9 ratio (ref <=5.0)
CHOLESTEROL: 162 mg/dL (ref 125–200)
HDL: 55 mg/dL (ref >=40)
LDL CALC: 88 mg/dL (ref <130)
TRIGLYCERIDES: 96 mg/dL (ref <150)
VLDL: 19 mg/dL (ref <30)

## 2016-02-10 LAB — CBC
HCT: 40.1 % (ref 38.5–50.0)
Hemoglobin: 13.7 g/dL (ref 13.2–17.1)
MCH: 33.3 pg — AB (ref 27.0–33.0)
MCHC: 34.2 g/dL (ref 32.0–36.0)
MCV: 97.6 fL (ref 80.0–100.0)
MPV: 10.1 fL (ref 7.5–12.5)
PLATELETS: 191 10*3/uL (ref 140–400)
RBC: 4.11 MIL/uL — AB (ref 4.20–5.80)
RDW: 12.9 % (ref 11.0–15.0)
WBC: 5.7 10*3/uL (ref 3.8–10.8)

## 2016-02-10 NOTE — Patient Instructions (Signed)
Medication Instructions:  Your physician recommends that you continue on your current medications as directed. Please refer to the Current Medication list given to you today.   Labwork: TODAY: CBC/LIPID  Testing/Procedures: None Ordered   Follow-Up: Your physician wants you to follow-up in: 1 year with Dr. Tenny Crawoss. You will receive a reminder letter in the mail two months in advance. If you don't receive a letter, please call our office to schedule the follow-up appointment.  Any Other Special Instructions Will Be Listed Below (If Applicable).     If you need a refill on your cardiac medications before your next appointment, please call your pharmacy.

## 2016-03-27 ENCOUNTER — Other Ambulatory Visit: Payer: Self-pay | Admitting: Internal Medicine

## 2017-02-21 ENCOUNTER — Other Ambulatory Visit: Payer: Self-pay | Admitting: Internal Medicine

## 2017-03-21 ENCOUNTER — Encounter: Payer: Self-pay | Admitting: Internal Medicine

## 2017-03-21 ENCOUNTER — Telehealth: Payer: Self-pay | Admitting: Internal Medicine

## 2017-03-21 ENCOUNTER — Ambulatory Visit (INDEPENDENT_AMBULATORY_CARE_PROVIDER_SITE_OTHER): Payer: Medicare Other | Admitting: Internal Medicine

## 2017-03-21 VITALS — BP 124/66 | HR 71 | Ht 67.0 in | Wt 174.1 lb

## 2017-03-21 DIAGNOSIS — E782 Mixed hyperlipidemia: Secondary | ICD-10-CM | POA: Diagnosis not present

## 2017-03-21 DIAGNOSIS — I2510 Atherosclerotic heart disease of native coronary artery without angina pectoris: Secondary | ICD-10-CM

## 2017-03-21 LAB — LIPID PANEL
CHOL/HDL RATIO: 2.7 ratio (ref 0.0–5.0)
Cholesterol, Total: 192 mg/dL (ref 100–199)
HDL: 72 mg/dL (ref >39)
LDL Calculated: 104 mg/dL — ABNORMAL HIGH (ref 0–99)
TRIGLYCERIDES: 82 mg/dL (ref 0–149)
VLDL Cholesterol Cal: 16 mg/dL (ref 5–40)

## 2017-03-21 LAB — CBC
HEMATOCRIT: 42 % (ref 37.5–51.0)
Hemoglobin: 14.8 g/dL (ref 13.0–17.7)
MCH: 34 pg — ABNORMAL HIGH (ref 26.6–33.0)
MCHC: 35.2 g/dL (ref 31.5–35.7)
MCV: 97 fL (ref 79–97)
PLATELETS: 172 10*3/uL (ref 150–379)
RBC: 4.35 x10E6/uL (ref 4.14–5.80)
RDW: 12.5 % (ref 12.3–15.4)
WBC: 5.7 10*3/uL (ref 3.4–10.8)

## 2017-03-21 MED ORDER — ASPIRIN EC 81 MG PO TBEC: 81.0000 mg | DELAYED_RELEASE_TABLET | Freq: Every day | ORAL | Status: DC

## 2017-03-21 NOTE — Telephone Encounter (Signed)
Pt's PCP updated in EPIC

## 2017-03-21 NOTE — Telephone Encounter (Signed)
New Message  FYI: Family Doctor is Dr Marcelino DusterJohn Johnston [phone]

## 2017-03-21 NOTE — Progress Notes (Signed)
Cardiology Office Note   Date:  03/21/2017   ID:  [name] [name], DOB 02/16/1934, MRN [id number]  PCP:  Rafael BihariWalker, John B III, MD  Cardiologist:   Dietrich PatesPaula Alysah Carton, MD   F/u of CAD     History of Present Illness: [name] [name] is a 83 y.o. male with a history of CAD, s/p NSTEMI . LHC 2/07 demonstrated an occluded OM, mild nonobstructive disease in the LAD with 20 and 40% lesions and preserved LV function. He had return of perfusion in his OM with wire passage alone. No balloon angioplasty or stenting was required.  Nuclear study 4/08: Prior lateral MI with mild peri-infarct ischemia. Echocardiogram 2/12: Mild LVH, EF 50-55%, grade 1 diastolic dysfunction, mild TR. Holter monitor 2/13: Sinus rhythm, frequent PVCs, couplets and triplets, occasional PACs.   I saw the pt in Fall 2017  Since seen breathing is good  No CP  Activity is good   New primeary MD took over for Dr Dan HumphreysWalker    Outpatient Medications Prior to Visit  Medication Sig Dispense Refill  . aspirin EC 325 MG tablet Take 325 mg by mouth daily.      . metoprolol succinate (TOPROL-XL) 25 MG 24 hr tablet take 1 tablet by mouth once daily 30 tablet 0  . NITROSTAT 0.4 MG SL tablet place 1 tablet under the tongue if needed every 5 minutes for chest pain for 3 doses IF NO RELIEF AFTER 3RD DOSE CALL PRESCRIBER OR 911. 25 tablet 2  . simvastatin (ZOCOR) 40 MG tablet take 1 tablet by mouth once daily 30 tablet 0  . sildenafil (VIAGRA) 50 MG tablet Take 1 tablet (50 mg total) by mouth daily as needed for erectile dysfunction. (Patient not taking: Reported on 03/21/2017) 10 tablet 1   No facility-administered medications prior to visit.      Allergies:   Other; Tetanus toxoid, adsorbed; and Tetanus toxoids   Past Medical History:  Diagnosis Date  . Bradycardia    relative, precluding up titration of his beta-blocker  . CAD (coronary artery disease)   . HLD (hyperlipidemia)   . Non-ST elevated myocardial infarction (non-STEMI) (HCC)     2/07: LHC with occluded OM, LAD 20% + 40%; normal LVF => PTCA of OM with wire passage alone   . PVC's (premature ventricular contractions)   . Syncope    hx    Past Surgical History:  Procedure Laterality Date  . CARDIAC CATHETERIZATION    . coronary arteriogram    . left ventriculogram    . reperfusion of obtuse marginal with a wife alone       Social History:  The patient  reports that he has never smoked. He has quit using smokeless tobacco. His smokeless tobacco use included Chew. He reports that he does not drink alcohol or use drugs.   Family History:  The patient's family history includes Heart attack in his father and mother.    ROS:  Please see the history of present illness. All other systems are reviewed and  Negative to the above problem except as noted.    PHYSICAL EXAM: VS:  BP 124/66   Pulse 71   Ht 5\' 7"  (1.702 m)   Wt 174 lb 1.9 oz (79 kg)   BMI 27.27 kg/m   GEN: Well nourished, well developed, in no acute distress HEENT: normal Neck: JVP normal  , carotid bruits, or masses Cardiac: RRR; no murmurs, rubs, or gallops,no edema  Respiratory:  clear to auscultation  bilaterally, normal work of breathing GI: soft, nontender, nondistended, + BS  No hepatomegaly  MS: no deformity Moving all extremities   Skin: warm and dry, no rash Neuro:  Strength and sensation are intact Psych: euthymic mood, full affect   EKG:  EKG is ordered today.  SR 71     Lipid Panel    Component Value Date/Time   CHOL 162 02/10/2016 1031   TRIG 96 02/10/2016 1031   HDL 55 02/10/2016 1031   CHOLHDL 2.9 02/10/2016 1031   VLDL 19 02/10/2016 1031   LDLCALC 88 02/10/2016 1031      Wt Readings from Last 3 Encounters:  03/21/17 174 lb 1.9 oz (79 kg)  02/10/16 178 lb (80.7 kg)  11/11/14 173 lb 9.6 oz (78.7 kg)      ASSESSMENT AND PLAN:  1  CAD No CP  N evid of ischemia  Switch to 81 mg aspirin    2 2  HL  Will check lipids  Today    Stay active      F/u in 1 year       Current medicines are reviewed at length with the patient today.  The patient does not have concerns regarding medicines.  Signed, Dietrich Pates, MD  03/21/2017 9:35 AM    [facility] [address] [place], [place], [place]  [address] Phone: [phone]; Fax: [phone]

## 2017-03-21 NOTE — Patient Instructions (Addendum)
Medication Instructions:  1. CHANGE ASPIRIN TO 81 MG DAILY  Labwork: TODAY CBC, FASTING LIPID  Testing/Procedures: NONE ORDERED TODAY  Follow-Up: Your physician wants you to follow-up in: 1 YEAR WITH DR. Tenny CrawOSS. You will receive a reminder letter in the mail two months in advance. If you don't receive a letter, please call our office to schedule the follow-up appointment.   Any Other Special Instructions Will Be Listed Below (If Applicable).     If you need a refill on your cardiac medications before your next appointment, please call your pharmacy.

## 2017-03-22 ENCOUNTER — Other Ambulatory Visit: Payer: Self-pay | Admitting: *Deleted

## 2017-03-22 DIAGNOSIS — E782 Mixed hyperlipidemia: Secondary | ICD-10-CM

## 2017-03-22 MED ORDER — EZETIMIBE 10 MG PO TABS: 5.0000 mg | ORAL_TABLET | Freq: Every day | ORAL | 3 refills | Status: DC

## 2017-03-25 ENCOUNTER — Other Ambulatory Visit: Payer: Self-pay | Admitting: *Deleted

## 2017-03-25 MED ORDER — SIMVASTATIN 40 MG PO TABS: 40.0000 mg | ORAL_TABLET | Freq: Every day | ORAL | 11 refills | Status: DC

## 2017-03-25 MED ORDER — METOPROLOL SUCCINATE ER 25 MG PO TB24: 25.0000 mg | ORAL_TABLET | Freq: Every day | ORAL | 11 refills | Status: DC

## 2017-05-24 ENCOUNTER — Other Ambulatory Visit: Payer: Medicare Other | Admitting: *Deleted

## 2017-05-24 DIAGNOSIS — E782 Mixed hyperlipidemia: Secondary | ICD-10-CM

## 2017-05-24 LAB — LIPID PANEL
CHOL/HDL RATIO: 2.1 ratio (ref 0.0–5.0)
Cholesterol, Total: 128 mg/dL (ref 100–199)
HDL: 62 mg/dL (ref >39)
LDL CALC: 52 mg/dL (ref 0–99)
TRIGLYCERIDES: 68 mg/dL (ref 0–149)
VLDL CHOLESTEROL CAL: 14 mg/dL (ref 5–40)

## 2018-01-06 ENCOUNTER — Ambulatory Visit: Payer: Medicare Other | Admitting: Internal Medicine

## 2018-01-06 ENCOUNTER — Encounter (INDEPENDENT_AMBULATORY_CARE_PROVIDER_SITE_OTHER): Payer: Self-pay

## 2018-01-06 ENCOUNTER — Encounter: Payer: Self-pay | Admitting: Internal Medicine

## 2018-01-06 VITALS — BP 110/58 | HR 64 | Ht 67.0 in | Wt 175.8 lb

## 2018-01-06 DIAGNOSIS — E782 Mixed hyperlipidemia: Secondary | ICD-10-CM | POA: Diagnosis not present

## 2018-01-06 DIAGNOSIS — I2510 Atherosclerotic heart disease of native coronary artery without angina pectoris: Secondary | ICD-10-CM | POA: Diagnosis not present

## 2018-01-06 NOTE — Progress Notes (Signed)
Cardiology Office Note   Date:  01/06/2018   ID:  [name] [name], DOB 10/14/1933, MRN [id number]  PCP:  Gracelyn NurseJohnston, John D, MD  Cardiologist:   Dietrich PatesPaula Josslynn Mentzer, MD   F/u of CAD     History of Present Illness: [name] [name] is a 84 y.o. male with a history of CAD, s/p NSTEMI . LHC 2/07 demonstrated an occluded OM, mild nonobstructive disease in the LAD with 20 and 40% lesions and preserved LV function. He had return of perfusion in his OM with wire passage alone. No balloon angioplasty or stenting was required.  Nuclear study 4/08: Prior lateral MI with mild peri-infarct ischemia. Echocardiogram 2/12: Mild LVH, EF 50-55%, grade 1 diastolic dysfunction, mild TR. Holter monitor 2/13: Sinus rhythm, frequent PVCs, couplets and triplets, occasional PACs.   I saw the pt in Fall 2018  Since seen the pt denies CP   Breathing is OK   No SOB     Outpatient Medications Prior to Visit  Medication Sig Dispense Refill  . aspirin EC 81 MG tablet Take 1 tablet (81 mg total) by mouth daily.    Marland Kitchen. BEE POLLEN PO Take 1 capsule by mouth daily.    . clopidogrel (PLAVIX) 75 MG tablet Take 75 mg by mouth daily.    Marland Kitchen. ezetimibe (ZETIA) 10 MG tablet Take 0.5 tablets (5 mg total) by mouth daily. 45 tablet 3  . metoprolol succinate (TOPROL-XL) 25 MG 24 hr tablet Take 1 tablet (25 mg total) daily by mouth. 30 tablet 11  . NITROSTAT 0.4 MG SL tablet place 1 tablet under the tongue if needed every 5 minutes for chest pain for 3 doses IF NO RELIEF AFTER 3RD DOSE CALL PRESCRIBER OR 911. 25 tablet 2  . simvastatin (ZOCOR) 40 MG tablet Take 1 tablet (40 mg total) daily by mouth. 30 tablet 11   No facility-administered medications prior to visit.      Allergies:   Other; Tetanus toxoid, adsorbed; and Tetanus toxoids   Past Medical History:  Diagnosis Date  . Bradycardia    relative, precluding up titration of his beta-blocker  . CAD (coronary artery disease)   . HLD (hyperlipidemia)   . Non-ST elevated  myocardial infarction (non-STEMI) (HCC)    2/07: LHC with occluded OM, LAD 20% + 40%; normal LVF => PTCA of OM with wire passage alone   . PVC's (premature ventricular contractions)   . Syncope    hx    Past Surgical History:  Procedure Laterality Date  . CARDIAC CATHETERIZATION    . coronary arteriogram    . left ventriculogram    . reperfusion of obtuse marginal with a wife alone       Social History:  The patient  reports that he has never smoked. He has quit using smokeless tobacco.  His smokeless tobacco use included chew. He reports that he does not drink alcohol or use drugs.   Family History:  The patient's family history includes Heart attack in his father and mother.    ROS:  Please see the history of present illness. All other systems are reviewed and  Negative to the above problem except as noted.    PHYSICAL EXAM: VS:  BP (!) 110/58 (BP Location: Right Arm, Patient Position: Sitting, Cuff Size: Normal)   Pulse 64   Ht 5\' 7"  (1.702 m)   Wt 175 lb 12.8 oz (79.7 kg)   SpO2 95%   BMI 27.53 kg/m   GEN: Well nourished,  well developed, in no acute distress HEENT: NCAT   Neck: JVP normal  , carotid bruits, or masses Cardiac: RRR;  Gr II/VI systolic murmur LSB / base  No  rubs, or gallops,no edema  Respiratory:  clear to auscultation bilaterally, normal work of breathing GI: soft, nontender, nondistended, + BS  No hepatomegaly  MS: s/p L aka Skin: warm and dry, no rash Neuro:  Strength and sensation are intact Psych: euthymic mood, full affect   EKG:  EKG is not  ordered today.     Lipid Panel    Component Value Date/Time   CHOL 128 05/24/2017 0809   TRIG 68 05/24/2017 0809   HDL 62 05/24/2017 0809   CHOLHDL 2.1 05/24/2017 0809   CHOLHDL 2.9 02/10/2016 1031   VLDL 19 02/10/2016 1031   LDLCALC 52 05/24/2017 0809      Wt Readings from Last 3 Encounters:  01/06/18 175 lb 12.8 oz (79.7 kg)  03/21/17 174 lb 1.9 oz (79 kg)  02/10/16 178 lb (80.7 kg)       ASSESSMENT AND PLAN:  1  CAD Remote cath   No symptoms  Of angina    He has been on plavix for awhile (was not on our med list)   I would recomm stopping    COntinue ASA        2 2  HL Lipids excellent n Jan 2019  Encouraged him to stay active      F/u in 1 year      Current medicines are reviewed at length with the patient today.  The patient does not have concerns regarding medicines.  Signed, Dietrich PatesPaula Raywood Wailes, MD  01/06/2018 1:51 PM    [facility] [address], [place], [place]  [address] Phone: [phone]; Fax: [phone]

## 2018-01-06 NOTE — Patient Instructions (Signed)
Your physician has recommended you make the following change in your medication:  1.) stop clopidogrel (Plavix)  Your physician wants you to follow-up in: June 2020 WITH DR. Tenny CrawOSS.  You will receive a reminder letter in the mail two months in advance. If you don't receive a letter, please call our office to schedule the follow-up appointment.

## 2018-06-12 ENCOUNTER — Other Ambulatory Visit: Payer: Self-pay | Admitting: *Deleted

## 2018-06-12 MED ORDER — SIMVASTATIN 40 MG PO TABS: 40.0000 mg | ORAL_TABLET | Freq: Every day | ORAL | 1 refills | Status: DC

## 2018-06-16 ENCOUNTER — Other Ambulatory Visit: Payer: Self-pay | Admitting: Internal Medicine

## 2018-06-16 MED ORDER — METOPROLOL SUCCINATE ER 25 MG PO TB24: 25.0000 mg | ORAL_TABLET | Freq: Every day | ORAL | 7 refills | Status: DC

## 2018-10-17 ENCOUNTER — Telehealth: Payer: Self-pay | Admitting: Internal Medicine

## 2018-10-17 NOTE — Telephone Encounter (Signed)
Phone visit. Pt does not have a smart phone. Verbal consent was given for phone visit.     Virtual Visit Pre-Appointment Phone Call  "(Name), I am calling you today to discuss your upcoming appointment. We are currently trying to limit exposure to the virus that causes COVID-19 by seeing patients at home rather than in the office."  1. "What is the BEST phone number to call the day of the visit?" - include this in appointment notes  2. Do you have or have access to (through a family member/friend) a smartphone with video capability that we can use for your visit?" a. If yes - list this number in appt notes as cell (if different from BEST phone #) and list the appointment type as a VIDEO visit in appointment notes b. If no - list the appointment type as a PHONE visit in appointment notes  3. Confirm consent - "In the setting of the current Covid19 crisis, you are scheduled for a (phone or video) visit with your provider on (date) at (time).  Just as we do with many in-office visits, in order for you to participate in this visit, we must obtain consent.  If you'd like, I can send this to your mychart (if signed up) or email for you to review.  Otherwise, I can obtain your verbal consent now.  All virtual visits are billed to your insurance company just like a normal visit would be.  By agreeing to a virtual visit, we'd like you to understand that the technology does not allow for your provider to perform an examination, and thus may limit your provider's ability to fully assess your condition. If your provider identifies any concerns that need to be evaluated in person, we will make arrangements to do so.  Finally, though the technology is pretty good, we cannot assure that it will always work on either your or our end, and in the setting of a video visit, we may have to convert it to a phone-only visit.  In either situation, we cannot ensure that we have a secure connection.  Are you willing to  proceed?"  YES  4. Advise patient to be prepared - "Two hours prior to your appointment, go ahead and check your blood pressure, pulse, oxygen saturation, and your weight (if you have the equipment to check those) and write them all down. When your visit starts, your provider will ask you for this information. If you have an Apple Watch or Kardia device, please plan to have heart rate information ready on the day of your appointment. Please have a pen and paper handy nearby the day of the visit as well."  5. Give patient instructions for MyChart download to smartphone OR [email or web address] as below if video visit (depending on what platform provider is using)  6. Inform patient they will receive a phone call 15 minutes prior to their appointment time (may be from unknown caller ID) so they should be prepared to answer    TELEPHONE CALL NOTE  [name] [name] has been deemed a candidate for a follow-up tele-health visit to limit community exposure during the Covid-19 pandemic. I spoke with the patient via phone to ensure availability of phone/video source, confirm preferred email & phone number, and discuss instructions and expectations.  I reminded [name] [name] to be prepared with any vital sign and/or heart rhythm information that could potentially be obtained via home monitoring, at the time of his visit. I reminded [name] [name] to  expect a phone call prior to his visit.  Scherrie Bateman 10/17/2018 4:07 PM   INSTRUCTIONS FOR DOWNLOADING THE MYCHART APP TO SMARTPHONE  - The patient must first make sure to have activated MyChart and know their login information - If Apple, go to [employer] and type in MyChart in the search bar and download the app. If Android, ask patient to go to [employer] and type in Madeline in the search bar and download the app. The app is free but as with any other app downloads, their phone may require them to verify saved payment information or Apple/Android  password.  - The patient will need to then log into the app with their MyChart username and password, and select [facility] as their healthcare provider to link the account. When it is time for your visit, go to the MyChart app, find appointments, and click Begin Video Visit. Be sure to Select Allow for your device to access the Microphone and Camera for your visit. You will then be connected, and your provider will be with you shortly.  **If they have any issues connecting, or need assistance please contact MyChart service desk [phone])**  **If using a computer, in order to ensure the best quality for their visit they will need to use either of the following Internet Browsers: [employer] or Google Chrome**  IF USING DOXIMITY or DOXY.ME - The patient will receive a link just prior to their visit by text.     FULL LENGTH CONSENT FOR TELE-HEALTH VISIT   I hereby voluntarily request, consent and authorize [facility] and its employed or contracted physicians, physician assistants, nurse practitioners or other licensed health care professionals (the Practitioner), to provide me with telemedicine health care services (the Services") as deemed necessary by the treating Practitioner. I acknowledge and consent to receive the Services by the Practitioner via telemedicine. I understand that the telemedicine visit will involve communicating with the Practitioner through live audiovisual communication technology and the disclosure of certain medical information by electronic transmission. I acknowledge that I have been given the opportunity to request an in-person assessment or other available alternative prior to the telemedicine visit and am voluntarily participating in the telemedicine visit.  I understand that I have the right to withhold or withdraw my consent to the use of telemedicine in the course of my care at any time, without affecting my right to future care or treatment,  and that the Practitioner or I may terminate the telemedicine visit at any time. I understand that I have the right to inspect all information obtained and/or recorded in the course of the telemedicine visit and may receive copies of available information for a reasonable fee.  I understand that some of the potential risks of receiving the Services via telemedicine include:   Delay or interruption in medical evaluation due to technological equipment failure or disruption;  Information transmitted may not be sufficient (e.g. poor resolution of images) to allow for appropriate medical decision making by the Practitioner; and/or   In rare instances, security protocols could fail, causing a breach of personal health information.  Furthermore, I acknowledge that it is my responsibility to provide information about my medical history, conditions and care that is complete and accurate to the best of my ability. I acknowledge that Practitioner's advice, recommendations, and/or decision may be based on factors not within their control, such as incomplete or inaccurate data provided by me or distortions of diagnostic images or specimens that may  result from electronic transmissions. I understand that the practice of medicine is not an exact science and that Practitioner makes no warranties or guarantees regarding treatment outcomes. I acknowledge that I will receive a copy of this consent concurrently upon execution via email to the email address I last provided but may also request a printed copy by calling the office of [facility].    I understand that my insurance will be billed for this visit.   I have read or had this consent read to me.  I understand the contents of this consent, which adequately explains the benefits and risks of the Services being provided via telemedicine.   I have been provided ample opportunity to ask questions regarding this consent and the Services and have had my questions  answered to my satisfaction.  I give my informed consent for the services to be provided through the use of telemedicine in my medical care  By participating in this telemedicine visit I agree to the above.

## 2018-10-23 NOTE — Progress Notes (Signed)
Virtual Visit via Telephone Note   This visit type was conducted due to national recommendations for restrictions regarding the COVID-19 Pandemic (e.g. social distancing) in an effort to limit this patient's exposure and mitigate transmission in our community.  Due to his co-morbid illnesses, this patient is at least at moderate risk for complications without adequate follow up.  This format is felt to be most appropriate for this patient at this time.  The patient did not have access to video technology/had technical difficulties with video requiring transitioning to audio format only (telephone).  All issues noted in this document were discussed and addressed.  No physical exam could be performed with this format.  Please refer to the patient's chart for his  consent to telehealth for [facility].   Date:  10/24/2018   ID:  [name] [name], DOB [birth year], MRN [id number]  Patient Location: Home Provider Location: Home  PCP:  Gracelyn Nurse, MD  Cardiologist:  Tenny Craw Evaluation Performed:  Follow-Up Visit  Chief Complaint:  F/U of CAD    History of Present Illness:    [name] [name] is a 85 y.o. male with  history of CAD, s/p NSTEMI . LHC 2/07 demonstrated an occluded OM, mild nonobstructive disease in the LAD with 20 and 40% lesions and preserved LV function. He had return of perfusion in his OM with wire passage alone. No balloon angioplasty or stenting was required.  Nuclear study 4/08: Prior lateral MI with mild peri-infarct ischemia. Echocardiogram 2/12: Mild LVH, EF 50-55%, grade 1 diastolic dysfunction, mild TR. Holter monitor 2/13: Sinus rhythm, frequent PVCs, couplets and triplets, occasional PACs.   I saw the pt in Aug 2019    SInce seen he has done well   No CP  Breathing is OK No dizziness  The patient does not have symptoms concerning for COVID-19 infection (fever, chills, cough, or new shortness of breath).    Past Medical History:  Diagnosis Date  . Bradycardia     relative, precluding up titration of his beta-blocker  . CAD (coronary artery disease)   . HLD (hyperlipidemia)   . Non-ST elevated myocardial infarction (non-STEMI) (HCC)    2/07: LHC with occluded OM, LAD 20% + 40%; normal LVF => PTCA of OM with wire passage alone   . PVC's (premature ventricular contractions)   . Syncope    hx   Past Surgical History:  Procedure Laterality Date  . CARDIAC CATHETERIZATION    . coronary arteriogram    . left ventriculogram    . reperfusion of obtuse marginal with a wife alone       Current Meds  Medication Sig  . aspirin EC 81 MG tablet Take 1 tablet (81 mg total) by mouth daily.  Marland Kitchen BEE POLLEN PO Take 1 capsule by mouth daily.  Marland Kitchen ezetimibe (ZETIA) 10 MG tablet Take 0.5 tablets (5 mg total) by mouth daily.  . metoprolol succinate (TOPROL-XL) 25 MG 24 hr tablet Take 1 tablet (25 mg total) by mouth daily.  Marland Kitchen NITROSTAT 0.4 MG SL tablet place 1 tablet under the tongue if needed every 5 minutes for chest pain for 3 doses IF NO RELIEF AFTER 3RD DOSE CALL PRESCRIBER OR 911.  . simvastatin (ZOCOR) 40 MG tablet Take 1 tablet (40 mg total) by mouth daily.     Allergies:   Other; Tetanus toxoid, adsorbed; and Tetanus toxoids   Social History   Tobacco Use  . Smoking status: Never Smoker  . Smokeless tobacco: Former  User    Types: Chew  Substance Use Topics  . Alcohol use: No    Alcohol/week: 0.0 standard drinks  . Drug use: No     Family Hx: The patient's family history includes Heart attack in his father and mother.  ROS:   Please see the history of present illness.    All other systems reviewed and are negative.   Prior CV studies:   The following studies were reviewed today:   Labs/Other Tests and Data Reviewed:    EKG:  No ECG reviewed.  Recent Labs: No results found for requested labs within last 8760 hours.   Recent Lipid Panel Lab Results  Component Value Date/Time   CHOL 128 05/24/2017 08:09 AM   TRIG 68 05/24/2017  08:09 AM   HDL 62 05/24/2017 08:09 AM   CHOLHDL 2.1 05/24/2017 08:09 AM   CHOLHDL 2.9 02/10/2016 10:31 AM   LDLCALC 52 05/24/2017 08:09 AM    Wt Readings from Last 3 Encounters:  10/24/18 171 lb (77.6 kg)  01/06/18 175 lb 12.8 oz (79.7 kg)  03/21/17 174 lb 1.9 oz (79 kg)     Objective:    Vital Signs:  Ht 5\' 7"  (1.702 m)   Wt 171 lb (77.6 kg)   BMI 26.78 kg/m    No BP taken    ASSESSMENT & PLAN:    1. CAD   No symtpoms of angina    WIll continue to follow   Check CBC  2   HL   WIll check lipids   I would recomm checking :   CBC, BMET, Lipids and PSA     Set follow up in persoin in October      3  COVID-19 Education: The signs and symptoms of COVID-19 were discussed with the patient and how to seek care for testing (follow up with PCP or arrange E-visit).  The importance of social distancing was discussed today.  Time:   Today, I have spent 15 minutes with the patient with telehealth technology discussing the above problems.     Medication Adjustments/Labs and Tests Ordered: Current medicines are reviewed at length with the patient today.  Concerns regarding medicines are outlined above.   Tests Ordered: No orders of the defined types were placed in this encounter.   Medication Changes: No orders of the defined types were placed in this encounter.   Disposition:  Follow up f/u in October  Signed, Nykira Reddix, MD  10/24/2018 8:36 AM    [facility]

## 2018-10-24 ENCOUNTER — Encounter: Payer: Self-pay | Admitting: Internal Medicine

## 2018-10-24 ENCOUNTER — Other Ambulatory Visit: Payer: Self-pay

## 2018-10-24 ENCOUNTER — Telehealth (INDEPENDENT_AMBULATORY_CARE_PROVIDER_SITE_OTHER): Payer: Medicare Other | Admitting: Internal Medicine

## 2018-10-24 VITALS — Ht 67.0 in | Wt 171.0 lb

## 2018-10-24 DIAGNOSIS — Z79899 Other long term (current) drug therapy: Secondary | ICD-10-CM

## 2018-10-24 DIAGNOSIS — I2510 Atherosclerotic heart disease of native coronary artery without angina pectoris: Secondary | ICD-10-CM

## 2018-10-24 DIAGNOSIS — R350 Frequency of micturition: Secondary | ICD-10-CM

## 2018-10-24 DIAGNOSIS — E782 Mixed hyperlipidemia: Secondary | ICD-10-CM

## 2018-10-24 MED ORDER — NITROGLYCERIN 0.4 MG SL SUBL: SUBLINGUAL_TABLET | SUBLINGUAL | 2 refills | Status: DC

## 2018-10-24 NOTE — Addendum Note (Signed)
Addended by: Bertram Millard on: 10/24/2018 09:00 AM   Modules accepted: Orders

## 2018-10-24 NOTE — Patient Instructions (Signed)
Medication Instructions:  Your physician recommends that you continue on your current medications as directed. Please refer to the Current Medication list given to you today.  If you need a refill on your cardiac medications before your next appointment, please call your pharmacy.   Lab work: Your physician recommends that you return for lab work in: today or next week... FASTING at the Medical Mall at [facility].Marland Kitchen may walk in anytime... CBC, LIPID, BMET, TSH, PSA  If you have labs (blood work) drawn today and your tests are completely normal, you will receive your results only by: Marland Kitchen MyChart Message (if you have MyChart) OR . A paper copy in the mail If you have any lab test that is abnormal or we need to change your treatment, we will call you to review the results.  Testing/Procedures: None ordered  Follow-Up: At [facility], you and your health needs are our priority.  As part of our continuing mission to provide you with exceptional heart care, we have created designated Provider Care Teams.  These Care Teams include your primary Cardiologist (physician) and Advanced Practice Providers (APPs -  Physician Assistants and Nurse Practitioners) who all work together to provide you with the care you need, when you need it. You will need a follow up appointment in:  4 months.  Please call our office 2 months in advance to schedule this appointment.  You may see No primary care provider on file. Dr. Tenny Craw or one of the following Advanced Practice Providers on your designated Care Team: Tereso Newcomer, PA-C [place], [place] . Berton Bon, NP  Any Other Special Instructions Will Be Listed Below (If Applicable).

## 2018-10-27 ENCOUNTER — Other Ambulatory Visit: Payer: Self-pay

## 2018-10-27 ENCOUNTER — Other Ambulatory Visit
Admission: RE | Admit: 2018-10-27 | Discharge: 2018-10-27 | Disposition: A | Discharge status: Home / Self Care | Payer: Medicare Other | Attending: Internal Medicine | Admitting: Internal Medicine

## 2018-10-27 DIAGNOSIS — R350 Frequency of micturition: Secondary | ICD-10-CM | POA: Insufficient documentation

## 2018-10-27 DIAGNOSIS — Z79899 Other long term (current) drug therapy: Secondary | ICD-10-CM | POA: Insufficient documentation

## 2018-10-27 DIAGNOSIS — I2510 Atherosclerotic heart disease of native coronary artery without angina pectoris: Secondary | ICD-10-CM | POA: Diagnosis present

## 2018-10-27 DIAGNOSIS — E782 Mixed hyperlipidemia: Secondary | ICD-10-CM | POA: Diagnosis present

## 2018-10-27 LAB — BASIC METABOLIC PANEL
Anion gap: 8 (ref 5–15)
BUN: 21 mg/dL (ref 8–23)
CO2: 29 mmol/L (ref 22–32)
Calcium: 9.3 mg/dL (ref 8.9–10.3)
Chloride: 101 mmol/L (ref 98–111)
Creatinine, Ser: 0.67 mg/dL (ref 0.61–1.24)
GFR calc Af Amer: >60 mL/min (ref >60)
GFR calc non Af Amer: >60 mL/min (ref >60)
Glucose, Bld: 119 mg/dL — ABNORMAL HIGH (ref 70–99)
Potassium: 4.1 mmol/L (ref 3.5–5.1)
Sodium: 138 mmol/L (ref 135–145)

## 2018-10-27 LAB — TSH: TSH: 0.829 u[IU]/mL (ref 0.350–4.500)

## 2018-10-27 LAB — CBC
HCT: 42.4 % (ref 39.0–52.0)
Hemoglobin: 14.4 g/dL (ref 13.0–17.0)
MCH: 33.3 pg (ref 26.0–34.0)
MCHC: 34 g/dL (ref 30.0–36.0)
MCV: 97.9 fL (ref 80.0–100.0)
Platelets: 190 10*3/uL (ref 150–400)
RBC: 4.33 MIL/uL (ref 4.22–5.81)
RDW: 11.8 % (ref 11.5–15.5)
WBC: 8 10*3/uL (ref 4.0–10.5)
nRBC: 0 % (ref 0.0–0.2)

## 2018-10-27 LAB — LIPID PANEL
Cholesterol: 149 mg/dL (ref 0–200)
HDL: 54 mg/dL (ref >40)
LDL Cholesterol: 78 mg/dL (ref 0–99)
Total CHOL/HDL Ratio: 2.8 RATIO
Triglycerides: 83 mg/dL (ref <150)
VLDL: 17 mg/dL (ref 0–40)

## 2018-10-27 LAB — PSA: Prostatic Specific Antigen: 8.85 ng/mL — ABNORMAL HIGH (ref 0.00–4.00)

## 2018-10-30 ENCOUNTER — Other Ambulatory Visit: Payer: Self-pay | Admitting: *Deleted

## 2018-10-30 DIAGNOSIS — R972 Elevated prostate specific antigen [PSA]: Secondary | ICD-10-CM

## 2018-10-30 NOTE — Progress Notes (Signed)
Referral per Dr. Harrington Challenger.  Called and spoke with Rosann Auerbach at [facility] who states no appointment has been made yet and referral is needed.  Referral placed.  The office will contact patient.

## 2018-11-11 ENCOUNTER — Other Ambulatory Visit: Payer: Self-pay

## 2018-11-11 ENCOUNTER — Encounter: Payer: Self-pay | Admitting: Urology

## 2018-11-11 ENCOUNTER — Ambulatory Visit: Payer: Medicare Other | Admitting: Urology

## 2018-11-11 VITALS — BP 122/74 | HR 62 | Ht 67.0 in | Wt 171.0 lb

## 2018-11-11 DIAGNOSIS — R972 Elevated prostate specific antigen [PSA]: Secondary | ICD-10-CM | POA: Diagnosis not present

## 2018-11-11 NOTE — Patient Instructions (Signed)
Overactive Bladder, Adult ° °Overactive bladder refers to a condition in which a person has a sudden need to pass urine. The person may leak urine if he or she cannot get to the bathroom fast enough (urinary incontinence). A person with this condition may also wake up several times in the night to go to the bathroom. °Overactive bladder is associated with poor nerve signals between your bladder and your brain. Your bladder may get the signal to empty before it is full. You may also have very sensitive muscles that make your bladder squeeze too soon. These symptoms might interfere with daily work or social activities. °What are the causes? °This condition may be associated with or caused by: °· Urinary tract infection. °· Infection of nearby tissues, such as the prostate. °· Prostate enlargement. °· Surgery on the uterus or urethra. °· Bladder stones, inflammation, or tumors. °· Drinking too much caffeine or alcohol. °· Certain medicines, especially medicines that get rid of extra fluid in the body (diuretics). °· Muscle or nerve weakness, especially from: °? A spinal cord injury. °? Stroke. °? Multiple sclerosis. °? Parkinson's disease. °· Diabetes. °· Constipation. °What increases the risk? °You may be at greater risk for overactive bladder if you: °· Are an older adult. °· Smoke. °· Are going through menopause. °· Have prostate problems. °· Have a neurological disease, such as stroke, dementia, Parkinson's disease, or multiple sclerosis (MS). °· Eat or drink things that irritate the bladder. These include alcohol, spicy food, and caffeine. °· Are overweight or obese. °What are the signs or symptoms? °Symptoms of this condition include: °· Sudden, strong urge to urinate. °· Leaking urine. °· Urinating 8 or more times a day. °· Waking up to urinate 2 or more times a night. °How is this diagnosed? °Your health care provider may suspect overactive bladder based on your symptoms. He or she will diagnose this condition  by: °· A physical exam and medical history. °· Blood or urine tests. You might need bladder or urine tests to help determine what is causing your overactive bladder. °You might also need to see a health care provider who specializes in urinary tract problems (urologist). °How is this treated? °Treatment for overactive bladder depends on the cause of your condition and whether it is mild or severe. You can also make lifestyle changes at home. Options include: °· Bladder training. This may include: °? Learning to control the urge to urinate by following a schedule that directs you to urinate at regular intervals (timed voiding). °? Doing Kegel exercises to strengthen your pelvic floor muscles, which support your bladder. Toning these muscles can help you control urination, even if your bladder muscles are overactive. °· Special devices. This may include: °? Biofeedback, which uses sensors to help you become aware of your body's signals. °? Electrical stimulation, which uses electrodes placed inside the body (implanted) or outside the body. These electrodes send gentle pulses of electricity to strengthen the nerves or muscles that control the bladder. °? Women may use a plastic device that fits into the vagina and supports the bladder (pessary). °· Medicines. °? Antibiotics to treat bladder infection. °? Antispasmodics to stop the bladder from releasing urine at the wrong time. °? Tricyclic antidepressants to relax bladder muscles. °? Injections of botulinum toxin type A directly into the bladder tissue to relax bladder muscles. °· Lifestyle changes. This may include: °? Weight loss. Talk to your health care provider about weight loss methods that would work best for you. °?   Diet changes. This may include reducing how much alcohol and caffeine you consume, or drinking fluids at different times of the day. ? Not smoking. Do not use any products that contain nicotine or tobacco, such as cigarettes and e-cigarettes. If  you need help quitting, ask your health care provider.  Surgery. ? A device may be implanted to help manage the nerve signals that control urination. ? An electrode may be implanted to stimulate electrical signals in the bladder. ? A procedure may be done to change the shape of the bladder. This is done only in very severe cases. Follow these instructions at home: Lifestyle  Make any diet or lifestyle changes that are recommended by your health care provider. These may include: ? Drinking less fluid or drinking fluids at different times of the day. ? Cutting down on caffeine or alcohol. ? Doing Kegel exercises. ? Losing weight if needed. ? Eating a healthy and balanced diet to prevent constipation. This may include:  Eating foods that are high in fiber, such as fresh fruits and vegetables, whole grains, and beans.  Limiting foods that are high in fat and processed sugars, such as fried and sweet foods. General instructions  Take over-the-counter and prescription medicines only as told by your health care provider.  If you were prescribed an antibiotic medicine, take it as told by your health care provider. Do not stop taking the antibiotic even if you start to feel better.  Use any implants or pessary as told by your health care provider.  If needed, wear pads to absorb urine leakage.  Keep a journal or log to track how much and when you drink and when you feel the need to urinate. This will help your health care provider monitor your condition.  Keep all follow-up visits as told by your health care provider. This is important. Contact a health care provider if:  You have a fever.  Your symptoms do not get better with treatment.  Your pain and discomfort get worse.  You have more frequent urges to urinate. Get help right away if:  You are not able to control your bladder. Summary  Overactive bladder refers to a condition in which a person has a sudden need to pass  urine.  Several conditions may lead to an overactive bladder.  Treatment for overactive bladder depends on the cause and severity of your condition.  Follow your health care provider's instructions about lifestyle changes, doing Kegel exercises, keeping a journal, and taking medicines. This information is not intended to replace advice given to you by your health care provider. Make sure you discuss any questions you have with your health care provider. Document Released: 03/03/2009 Document Revised: 05/23/2017 Document Reviewed: 05/23/2017 Elsevier Interactive Patient Education  2019 [employer] Prostate Cancer Screening  The prostate is a walnut-sized gland that is located below the bladder and in front of the rectum in males. The function of the prostate (prostate gland) is to add fluid to semen during ejaculation. Prostate cancer is the second most common type of cancer in men. A screening test for cancer is a test that is done before cancer symptoms start. Screening can help to identify cancer at an early stage, when the cancer can be treated more easily. The recommended prostate cancer screening test is a blood test called the prostate-specific antigen (PSA) test. PSA is a protein that is made in the prostate. As you age, your prostate naturally produces more PSA. Abnormally high PSA levels may be  caused by:  Prostate cancer.  An enlarged prostate that is not caused by cancer (benign prostatic hyperplasia, BPH). This condition is very common in older men.  A prostate gland infection (prostatitis).  Medicines to assist with hair growth, such as finasteride. Depending on the PSA results, you may need more tests, such as:  A physical exam to check the size of your prostate gland.  Blood and imaging tests.  A procedure to remove tissue samples from your prostate gland for testing (biopsy). Who should have screening? Screening recommendations vary based on age.  If you are younger  than age 83, screening is not recommended.  If you are age 83-54 and you have no risk factors, screening is not recommended.  If you are younger than age 83, ask your health care provider if you need screening if you have one of these risk factors: ? Being of African-American descent. ? Having a family history of prostate cancer.  If you are age 83-69, talk with your health care provider about your need for screening and how often screening should be done.  If you are older than age 83, screening is not recommended. This is because the risks that screening can cause are greater than the benefits that it may provide (risks outweigh the benefits). If you are at high risk for prostate cancer, your health care provider may recommend that you have screenings more often or start screening at a younger age. You may be at high risk if you:  Are older than age 83.  Are African-American.  Have a father, brother, or uncle who has been diagnosed with prostate cancer. The risk may be higher if your family member's cancer occurred at an early age. What are the benefits of screening? There is a small chance that screening may lower your risk of dying from prostate cancer. The chance is small because prostate cancer is typically a slow-growing cancer, and most men with prostate cancer die from a different cause. What are the risks of screening? The main risk of prostate cancer screening is diagnosing and treating prostate cancer that would never have caused any symptoms or problems (overdiagnosis and overtreatment). PSA screening cannot tell you if your PSA is high due to cancer or a different cause. A prostate biopsy is the only procedure to diagnose prostate cancer. Even the results of a biopsy may not tell you if your cancer needs to be treated. Slow-growing prostate cancer may not need any treatment other than monitoring, so diagnosing and treating it may cause unnecessary stress or other side effects. A  prostate biopsy may also cause:  Infection or fever.  A false negative. This is a result that shows that you do not have prostate cancer when you actually do have prostate cancer. Questions to ask your health care provider  When should I start prostate cancer screening?  What is my risk for prostate cancer?  How often do I need screening?  What type of screening tests do I need?  How do I get my test results?  What do my results mean?  Do I need treatment? Contact a health care provider if:  You have difficulty urinating.  You have pain when you urinate or ejaculate.  You have blood in your urine or semen.  You have pain in your back or in the area of your prostate.  You have trouble getting or maintaining an erection (erectile dysfunction, ED). Summary  Prostate cancer is a common type of cancer in men.  The prostate (prostate gland) is located below the bladder and in front of the rectum. This gland adds fluid to semen during ejaculation.  Prostate cancer screening may identify cancer at an early stage, when the cancer can be treated more easily.  The prostate-specific antigen (PSA) test is the recommended screening test for prostate cancer.  Discuss the risks and benefits of prostate cancer screening with your health care provider. If you are age 22 or older, screening is likely to lead to more risks than benefits (risks outweigh the benefits). This information is not intended to replace advice given to you by your health care provider. Make sure you discuss any questions you have with your health care provider. Document Released: 02/15/2017 Document Revised: 02/15/2017 Document Reviewed: 02/15/2017 Elsevier Interactive Patient Education  2019 [employer].

## 2018-11-11 NOTE — Progress Notes (Signed)
11/11/2018 3:23 PM   [name] [name] [birth year] [id number]  Referring provider: Baxter Hire, MD [place],  [place] [address]  CC: Elevated PSA  HPI: I saw [name] [name] in urology clinic today in consultation for elevated PSA from Dr. Wynetta Emery.  He is an 85 year old male with a history of CAD who was found to have an elevated PSA of 8.85 on 10/27/2018.  He reports he had a febrile illness at the time of this lab work.  There are no prior PSAs to compare to.  He has a family history of non-lethal prostate cancer in his brother that was managed with surveillance.  He works as a Pharmacist, community.  He does have some mild urinary symptoms during the day with some urgency, and nocturia 0-1 time per night.  He is minimally bothered by his urinary symptoms.  PVR in clinic today was 4 cc.  There are no aggravating or alleviating factors.  Severity is moderate.   PMH: Past Medical History:  Diagnosis Date  . Bradycardia    relative, precluding up titration of his beta-blocker  . CAD (coronary artery disease)   . HLD (hyperlipidemia)   . Non-ST elevated myocardial infarction (non-STEMI) ([place])    2/07: LHC with occluded OM, LAD 20% + 40%; normal LVF => PTCA of OM with wire passage alone   . PVC's (premature ventricular contractions)   . Syncope    hx    Surgical History: Past Surgical History:  Procedure Laterality Date  . CARDIAC CATHETERIZATION    . coronary arteriogram    . left ventriculogram    . reperfusion of obtuse marginal with a wife alone      Allergies:  Allergies  Allergen Reactions  . Other Other (See Comments)    Tetanus shot  . Tetanus Toxoid, Adsorbed     Tetanus shot  . Tetanus Toxoids     Tetanus shot    Family History: Family History  Problem Relation Age of Onset  . Heart attack Mother   . Heart attack Father     Social History:  reports that he has never smoked. He has quit using smokeless tobacco.  His smokeless tobacco use included chew.  He reports that he does not drink alcohol or use drugs.  ROS: Please see flowsheet from today's date for complete review of systems.  Physical Exam: BP 122/74 (BP Location: Left Arm, Patient Position: Sitting)   Pulse 62   Ht 5\' 7"  (1.702 m)   Wt 171 lb (77.6 kg)   BMI 26.78 kg/m    Constitutional:  Alert and oriented, No acute distress. Cardiovascular: No clubbing, cyanosis, or edema. Respiratory: Normal respiratory effort, no increased work of breathing. GI: Abdomen is soft, nontender, nondistended, no abdominal masses GU: No CVA tenderness,phallus without lesions, widely patent meatus DRE: 50g smooth, no masses or nodules Lymph: No cervical or inguinal lymphadenopathy. Skin: No rashes, bruises or suspicious lesions. Neurologic: Grossly intact, no focal deficits, moving all 4 extremities. Psychiatric: Normal mood and affect.  Laboratory Data: Reviewed  Pertinent Imaging: None to review  Assessment & Plan:   In summary, the patient is an 85 year old male with a single isolated PSA of 8.85, drawn at the time of a febrile illness.  We reviewed at length the AUA guidelines that do not recommend routine screening in men over age 42.  We also reviewed a normal PSA for men in their 68s is <6.5.  His DRE is reassuring today with no palpable  disease.  We reviewed the implications of an elevated PSA and the uncertainty surrounding it. In general, a man's PSA increases with age and is produced by both normal and cancerous prostate tissue. The differential diagnosis for elevated PSA includes BPH, prostate cancer, infection, recent intercourse/ejaculation, recent urethroscopic manipulation (foley placement/cystoscopy) or trauma, and prostatitis.   Management of an elevated PSA can include observation or prostate biopsy and we discussed this in detail. Our goal is to detect clinically significant prostate cancers, and manage with either active surveillance, surgery, or radiation for localized  disease. Risks of prostate biopsy include bleeding, infection (including life threatening sepsis), pain, and lower urinary symptoms. Hematuria, hematospermia, and blood in the stool are all common after biopsy and can persist up to 4 weeks.   I recommended no further investigation of his single isolated PSA drawn at the time of febrile illness.  We discussed the low, but not 0, risk of developing metastatic prostate cancer in the next 5 to 10 years.  He is in agreement for no further work-up of repeating PSAs or pursuing prostate biopsy.  Sondra ComeBrian C Sninsky, MD  [facility] [address], [address] [place], [place] [address] [phone]

## 2018-12-11 ENCOUNTER — Other Ambulatory Visit: Payer: Self-pay | Admitting: Internal Medicine

## 2019-06-07 ENCOUNTER — Other Ambulatory Visit: Payer: Self-pay | Admitting: Internal Medicine

## 2019-09-05 ENCOUNTER — Other Ambulatory Visit: Payer: Self-pay | Admitting: Internal Medicine

## 2019-09-21 ENCOUNTER — Observation Stay (HOSPITAL_BASED_OUTPATIENT_CLINIC_OR_DEPARTMENT_OTHER)
Admit: 2019-09-21 | Discharge: 2019-09-21 | Disposition: A | Discharge status: Home / Self Care | Payer: Medicare Other | Attending: Internal Medicine | Admitting: Internal Medicine

## 2019-09-21 ENCOUNTER — Other Ambulatory Visit: Payer: Self-pay

## 2019-09-21 ENCOUNTER — Emergency Department: Payer: Medicare Other

## 2019-09-21 ENCOUNTER — Observation Stay (HOSPITAL_BASED_OUTPATIENT_CLINIC_OR_DEPARTMENT_OTHER): Payer: Medicare Other

## 2019-09-21 ENCOUNTER — Observation Stay: Admit: 2019-09-21 | Payer: Medicare Other

## 2019-09-21 ENCOUNTER — Observation Stay
Admission: EM | Admit: 2019-09-21 | Discharge: 2019-09-21 | Disposition: A | Discharge status: Home / Self Care | Payer: Medicare Other | Attending: Internal Medicine | Admitting: Internal Medicine

## 2019-09-21 ENCOUNTER — Encounter: Payer: Self-pay | Admitting: Emergency Medicine

## 2019-09-21 DIAGNOSIS — I2510 Atherosclerotic heart disease of native coronary artery without angina pectoris: Secondary | ICD-10-CM | POA: Diagnosis not present

## 2019-09-21 DIAGNOSIS — I252 Old myocardial infarction: Secondary | ICD-10-CM | POA: Diagnosis not present

## 2019-09-21 DIAGNOSIS — R0602 Shortness of breath: Secondary | ICD-10-CM | POA: Diagnosis not present

## 2019-09-21 DIAGNOSIS — I350 Nonrheumatic aortic (valve) stenosis: Secondary | ICD-10-CM | POA: Diagnosis not present

## 2019-09-21 DIAGNOSIS — E7800 Pure hypercholesterolemia, unspecified: Secondary | ICD-10-CM | POA: Diagnosis not present

## 2019-09-21 DIAGNOSIS — E782 Mixed hyperlipidemia: Secondary | ICD-10-CM | POA: Diagnosis not present

## 2019-09-21 DIAGNOSIS — Z20822 Contact with and (suspected) exposure to covid-19: Secondary | ICD-10-CM | POA: Insufficient documentation

## 2019-09-21 DIAGNOSIS — R079 Chest pain, unspecified: Secondary | ICD-10-CM | POA: Diagnosis present

## 2019-09-21 DIAGNOSIS — I209 Angina pectoris, unspecified: Secondary | ICD-10-CM | POA: Diagnosis present

## 2019-09-21 DIAGNOSIS — Z7982 Long term (current) use of aspirin: Secondary | ICD-10-CM | POA: Insufficient documentation

## 2019-09-21 DIAGNOSIS — Z79899 Other long term (current) drug therapy: Secondary | ICD-10-CM | POA: Insufficient documentation

## 2019-09-21 DIAGNOSIS — I10 Essential (primary) hypertension: Secondary | ICD-10-CM | POA: Diagnosis not present

## 2019-09-21 DIAGNOSIS — E785 Hyperlipidemia, unspecified: Secondary | ICD-10-CM | POA: Diagnosis not present

## 2019-09-21 DIAGNOSIS — I25119 Atherosclerotic heart disease of native coronary artery with unspecified angina pectoris: Secondary | ICD-10-CM | POA: Diagnosis not present

## 2019-09-21 LAB — RESPIRATORY PANEL BY RT PCR (FLU A&B, COVID)
Influenza A by PCR: NEGATIVE
Influenza B by PCR: NEGATIVE
SARS Coronavirus 2 by RT PCR: NEGATIVE

## 2019-09-21 LAB — BRAIN NATRIURETIC PEPTIDE: B Natriuretic Peptide: 58 pg/mL (ref 0.0–100.0)

## 2019-09-21 LAB — NM MYOCAR MULTI W/SPECT W/WALL MOTION / EF
LV dias vol: 82 mL (ref 62–150)
LV sys vol: 29 mL
Peak HR: 78 {beats}/min
Percent HR: 56 %
Rest HR: 58 {beats}/min
SDS: 0
SRS: 9
SSS: 7
TID: 0.85

## 2019-09-21 LAB — BASIC METABOLIC PANEL
Anion gap: 7 (ref 5–15)
BUN: 16 mg/dL (ref 8–23)
CO2: 30 mmol/L (ref 22–32)
Calcium: 9.2 mg/dL (ref 8.9–10.3)
Chloride: 101 mmol/L (ref 98–111)
Creatinine, Ser: 0.67 mg/dL (ref 0.61–1.24)
GFR calc Af Amer: >60 mL/min (ref >60)
GFR calc non Af Amer: >60 mL/min (ref >60)
Glucose, Bld: 132 mg/dL — ABNORMAL HIGH (ref 70–99)
Potassium: 4.5 mmol/L (ref 3.5–5.1)
Sodium: 138 mmol/L (ref 135–145)

## 2019-09-21 LAB — TROPONIN I (HIGH SENSITIVITY)
Troponin I (High Sensitivity): 5 ng/L (ref <18)
Troponin I (High Sensitivity): 7 ng/L (ref <18)

## 2019-09-21 LAB — CBC
HCT: 43.3 % (ref 39.0–52.0)
Hemoglobin: 15 g/dL (ref 13.0–17.0)
MCH: 34 pg (ref 26.0–34.0)
MCHC: 34.6 g/dL (ref 30.0–36.0)
MCV: 98.2 fL (ref 80.0–100.0)
Platelets: 213 10*3/uL (ref 150–400)
RBC: 4.41 MIL/uL (ref 4.22–5.81)
RDW: 12 % (ref 11.5–15.5)
WBC: 9.4 10*3/uL (ref 4.0–10.5)
nRBC: 0 % (ref 0.0–0.2)

## 2019-09-21 LAB — ECHOCARDIOGRAM COMPLETE
Height: 67 in
Weight: 2728 oz

## 2019-09-21 MED ORDER — SIMVASTATIN 40 MG PO TABS
40.0000 mg | ORAL_TABLET | Freq: Every day | ORAL | Status: DC
  Filled 2019-09-21: qty 1

## 2019-09-21 MED ORDER — NITROGLYCERIN 0.4 MG SL SUBL: 0.4000 mg | SUBLINGUAL_TABLET | SUBLINGUAL | Status: DC | PRN

## 2019-09-21 MED ORDER — IOHEXOL 350 MG/ML SOLN
75.0000 mL | Freq: Once | INTRAVENOUS | Status: AC | PRN
  Administered 2019-09-21: 06:00:00 75 mL via INTRAVENOUS

## 2019-09-21 MED ORDER — ASPIRIN 81 MG PO CHEW
162.0000 mg | CHEWABLE_TABLET | Freq: Once | ORAL | Status: AC
  Administered 2019-09-21: 06:00:00 162 mg via ORAL
  Filled 2019-09-21: qty 2

## 2019-09-21 MED ORDER — TECHNETIUM TC 99M TETROFOSMIN IV KIT
9.9800 | PACK | Freq: Once | INTRAVENOUS | Status: AC | PRN
  Administered 2019-09-21: 10:00:00 9.98 via INTRAVENOUS

## 2019-09-21 MED ORDER — ONDANSETRON HCL 4 MG/2ML IJ SOLN: 4.0000 mg | Freq: Four times a day (QID) | INTRAMUSCULAR | Status: DC | PRN

## 2019-09-21 MED ORDER — TECHNETIUM TC 99M TETROFOSMIN IV KIT
32.5300 | PACK | Freq: Once | INTRAVENOUS | Status: AC | PRN
  Administered 2019-09-21: 11:00:00 32.53 via INTRAVENOUS

## 2019-09-21 MED ORDER — REGADENOSON 0.4 MG/5ML IV SOLN
0.4000 mg | Freq: Once | INTRAVENOUS | Status: AC
  Administered 2019-09-21: 0.4 mg via INTRAVENOUS

## 2019-09-21 MED ORDER — ENOXAPARIN SODIUM 40 MG/0.4ML ~~LOC~~ SOLN: 40.0000 mg | SUBCUTANEOUS | Status: DC

## 2019-09-21 MED ORDER — SODIUM CHLORIDE 0.9% FLUSH: 3.0000 mL | Freq: Once | INTRAVENOUS | Status: DC

## 2019-09-21 MED ORDER — ASPIRIN EC 81 MG PO TBEC: 81.0000 mg | DELAYED_RELEASE_TABLET | Freq: Every day | ORAL | Status: DC

## 2019-09-21 MED ORDER — EZETIMIBE 10 MG PO TABS
5.0000 mg | ORAL_TABLET | Freq: Every day | ORAL | Status: DC
  Filled 2019-09-21: qty 0.5

## 2019-09-21 MED ORDER — METOPROLOL SUCCINATE ER 25 MG PO TB24
25.0000 mg | ORAL_TABLET | Freq: Every day | ORAL | Status: DC
  Filled 2019-09-21: qty 1

## 2019-09-21 MED ORDER — ACETAMINOPHEN 325 MG PO TABS: 650.0000 mg | ORAL_TABLET | ORAL | Status: DC | PRN

## 2019-09-21 NOTE — ED Triage Notes (Addendum)
Patient with complaint of left chest pain, upper back pain and pain radiating down his left arm. Patient states that he is having shortness of breath. Patient states that he has a history of a heart attack. Patient states that this pain is worse then when he had a heart attack. Patient states that the pain improved some with nitroglycerin.

## 2019-09-21 NOTE — ED Provider Notes (Signed)
[facility] Emergency Department Provider Note  ____________________________________________   First MD Initiated Contact with Patient 09/21/19 [phone]     (approximate)  I have reviewed the triage vital signs and the nursing notes.   HISTORY  Chief Complaint Chest Pain    HPI [name] [name] is a 86 y.o. male with below list of previous medical conditions including MI approximately 20 years ago presents to the emergency department secondary to acute onset of central chest pain with radiation to his back and left arm which began at 2:30 AM this morning.  Patient states that pain is improved after taking sublingual nitroglycerin.  Patient admitted to dyspnea however denies any at present.  Patient denies any dizziness no palpitations.  Patient denies any lower extremity pain or swelling.  Patient states his current pain is mild       Past Medical History:  Diagnosis Date  . Bradycardia    relative, precluding up titration of his beta-blocker  . CAD (coronary artery disease)   . HLD (hyperlipidemia)   . Non-ST elevated myocardial infarction (non-STEMI) ([place])    2/07: LHC with occluded OM, LAD 20% + 40%; normal LVF => PTCA of OM with wire passage alone   . PVC's (premature ventricular contractions)   . Syncope    hx    Patient Active Problem List   Diagnosis Date Noted  . ATRIAL FIBRILLATION 06/15/2010  . PURE HYPERCHOLESTEROLEMIA 06/07/2010  . CORONARY ATHEROSCLEROSIS NATIVE CORONARY ARTERY 06/07/2010  . Hyperlipidemia, mixed 06/09/2009  . VENTRICULAR ECTOPY 12/06/2008  . CAD (coronary artery disease) 12/02/2008    Past Surgical History:  Procedure Laterality Date  . CARDIAC CATHETERIZATION    . coronary arteriogram    . left ventriculogram    . reperfusion of obtuse marginal with a wife alone      Prior to Admission medications   Medication Sig Start Date End Date Taking? Authorizing Provider  aspirin EC 81 MG tablet Take 1 tablet (81 mg  total) by mouth daily. 03/21/17   Fay Records, MD  BEE POLLEN PO Take 1 capsule by mouth daily.    [provider]  ezetimibe (ZETIA) 10 MG tablet Take 0.5 tablets (5 mg total) by mouth daily. 03/22/17   Fay Records, MD  metoprolol succinate (TOPROL-XL) 25 MG 24 hr tablet Take 1 tablet (25 mg total) by mouth daily. Please make yearly appt with Dr. Harrington Challenger for June for future refills. 1st attempt 09/07/19   Fay Records, MD  nitroGLYCERIN (NITROSTAT) 0.4 MG SL tablet place 1 tablet under the tongue if needed every 5 minutes for chest pain for 3 doses IF NO RELIEF AFTER 3RD DOSE CALL PRESCRIBER OR 911. 10/24/18   Fay Records, MD  simvastatin (ZOCOR) 40 MG tablet TAKE 1 TABLET BY MOUTH EVERY DAY 06/09/19   Fay Records, MD    Allergies Other; Tetanus toxoid, adsorbed; and Tetanus toxoids  Family History  Problem Relation Age of Onset  . Heart attack Mother   . Heart attack Father     Social History Social History   Tobacco Use  . Smoking status: Never Smoker  . Smokeless tobacco: Former Systems developer    Types: Chew  Substance Use Topics  . Alcohol use: No    Alcohol/week: 0.0 standard drinks  . Drug use: No    Review of Systems Constitutional: No fever/chills Eyes: No visual changes. ENT: No sore throat. Cardiovascular: Positive for chest pain. Respiratory: Positive for shortness of  breath. Gastrointestinal: No abdominal pain.  No nausea, no vomiting.  No diarrhea.  No constipation. Genitourinary: Negative for dysuria. Musculoskeletal: Negative for neck pain.  Negative for back pain. Integumentary: Negative for rash. Neurological: Negative for headaches, focal weakness or numbness.  ____________________________________________   PHYSICAL EXAM:  VITAL SIGNS: ED Triage Vitals  Enc Vitals Group     BP 09/21/19 0451 (!) 155/84     Pulse Rate 09/21/19 0451 67     Resp 09/21/19 0451 18     Temp 09/21/19 0451 97.6 F (36.4 C)     Temp src --      SpO2 09/21/19 0451 98 %       Weight 09/21/19 0452 78 kg (172 lb)     Height 09/21/19 0452 1.702 m (5\' 7" )     Head Circumference --      Peak Flow --      Pain Score 09/21/19 0452 7     Pain Loc --      Pain Edu? --      Excl. in GC? --     Constitutional: Alert and oriented.  Eyes: Conjunctivae are normal.  Mouth/Throat: Patient is wearing a mask. Neck: No stridor.  No meningeal signs.   Cardiovascular: Normal rate, regular rhythm. Good peripheral circulation. Grossly normal heart sounds. Respiratory: Normal respiratory effort.  No retractions. Gastrointestinal: Soft and nontender. No distention.  Musculoskeletal: No lower extremity tenderness nor edema. No gross deformities of extremities. Neurologic:  Normal speech and language. No gross focal neurologic deficits are appreciated.  Skin:  Skin is warm, dry and intact. Psychiatric: Mood and affect are normal. Speech and behavior are normal.  ____________________________________________   LABS (all labs ordered are listed, but only abnormal results are displayed)  Labs Reviewed  BASIC METABOLIC PANEL - Abnormal; Notable for the following components:      Result Value   Glucose, Bld 132 (*)    All other components within normal limits  RESPIRATORY PANEL BY RT PCR (FLU A&B, COVID)  CBC  TROPONIN I (HIGH SENSITIVITY)   ____________________________________________  EKG  ED ECG REPORT I, [place] Andra Heslin, the attending physician, personally viewed and interpreted this ECG.   Date: 09/21/2019  EKG Time: 4:52 AM  Rate: 64  Rhythm: Normal sinus rhythm  Axis: Normal  Intervals: Normal  ST&T Change: None  ____________________________________________  RADIOLOGY I, [place] Daniel Johndrow, personally viewed and evaluated these images (plain radiographs) as part of my medical decision making, as well as reviewing the written report by the radiologist.  ED MD interpretation: Hazy and interstitial opacities with vascular congestion likely reflecting edema  although atypical infection could have a similar appearance on chest x-ray impression per radiologist.  Official radiology report(s): DG Chest Port 1 View  Result Date: 09/21/2019 CLINICAL DATA:  Left chest pain, upper back pain and pain radiating down the left arm EXAM: PORTABLE CHEST 1 VIEW COMPARISON:  Radiograph 07/07/2005 FINDINGS: There is increased central vascular congestion with diffuse hazy and reticular interstitial opacities with a slight basilar gradient. No pneumothorax or effusion. Chronic elevation of the right hemidiaphragm. Stable cardiomediastinal contours with a calcified aorta. No acute osseous or soft tissue abnormality. Degenerative changes are present in the imaged spine and shoulders. Redemonstration of surgical clips at the GE junction. Telemetry leads overlie the chest. IMPRESSION: Hazy and interstitial opacities with vascular congestion likely reflect edema though atypical infection could have a similar appearance in the appropriate clinical setting. Electronically Signed   By: 07/09/2005  M.D.   On: 09/21/2019 05:22    ________  Procedures   ____________________________________________   INITIAL IMPRESSION / MDM / ASSESSMENT AND PLAN / ED COURSE  As part of my medical decision making, I reviewed the following data within the electronic MEDICAL RECORD NUMBER 86 year old male presented with above-stated history and physical exam a differential diagnosis including but not limited to ACS, pulmonary emboli, aortic disease.  EKG revealed no evidence of ischemia or infarction.  Laboratory data including high-sensitivity troponin negative thus far.  Will obtain a CT angiogram of the chest.  Will also repeat troponin.  Patient's care transferred to Dr. Fuller Plan  ____________________________________________  FINAL CLINICAL IMPRESSION(S) / ED DIAGNOSES  Final diagnoses:  Chest pain     MEDICATIONS GIVEN DURING THIS VISIT:  Medications  sodium chloride flush (NS) 0.9 %  injection 3 mL (has no administration in time range)  aspirin chewable tablet 162 mg (162 mg Oral Given 09/21/19 0549)     ED Discharge Orders    None      *Please note:  ROSEVELT LUU was evaluated in Emergency Department on 09/21/2019 for the symptoms described in the history of present illness. He was evaluated in the context of the global COVID-19 pandemic, which necessitated consideration that the patient might be at risk for infection with the SARS-CoV-2 virus that causes COVID-19. Institutional protocols and algorithms that pertain to the evaluation of patients at risk for COVID-19 are in a state of rapid change based on information released by regulatory bodies including the CDC and federal and state organizations. These policies and algorithms were followed during the patient's care in the ED.  Some ED evaluations and interventions may be delayed as a result of limited staffing during the pandemic.*  Note:  This document was prepared using Dragon voice recognition software and may include unintentional dictation errors.   Darci Current, MD 09/21/19 [phone]

## 2019-09-21 NOTE — Progress Notes (Signed)
*  PRELIMINARY RESULTS* Echocardiogram 2D Echocardiogram has been performed.  Cristela Blue 09/21/2019, 2:32 PM

## 2019-09-21 NOTE — Consult Note (Signed)
Cardiology Consultation:   Patient ID: [name] [name]; [id number]; [birth year]   Admit date: 09/21/2019 Date of Consult: 09/21/2019  Primary Care Provider: Baxter Hire, MD Primary Cardiologist: Harrington Challenger   Patient Profile:   [name] [name] is a 86 y.o. male with a hx of CAD with NSTEMI in 2007 as detailed below, PVCs, syncope, and HLD who is being seen today for the evaluation of chest pain at the request of Dr. Francine Graven.  History of Present Illness:   [name] [name] was admitted to the hospital in 06/2005 with a NSTEMI. LHC demonstrated an occluded LCx with otherwise nonobstructive disease in the LAD with 20 and 40% lesions with preserved LVSF. He had reperfusion in his OM with wire passage alone and did not require angioplasty or stenting. Nuclear stress test in 2008 showed a prior lateral MI with mild peri-infarct ischemia. Echo from 06/2010 showed an EF of 50-55%, mild LVH, Gr1DD, and mild TR. Holter monitor from 2013 showed sinus rhythm with frequent PVCs, couplets, and triplets with occasional PACs. He was last seen by Dr. Harrington Challenger virtually in 10/2018, and was doing well from a cardiac perspective.   He was in his usual state of health this morning when he got up to void around 2 AM.  Upon getting back in bed he laid on his right shoulder.  Following this, he developed a deep left sided chest pain/left shoulder pain that felt like pressure.  Pain was more intense than what he experienced during his MI in 2007.  There was some associated shortness of breath and nausea without diaphoresis, emesis, palpitations, dizziness, presyncope, or syncope.  He took 2 sublingual nitroglycerin along with 2 baby aspirin with slight improvement in pressure though not full resolution.  In total, his chest pressure lasted for approximately 2 hours in duration.  Upon the patient's arrival to [facility] they were found to have BP 3536 to 144R systolic, HR 15Q-00Q bpm, temp afebrile, oxygen saturation 98% on room air, weight  78 kg. EKG showed NSR, 62 bpm, rare PVC, no acute st/t changes, CXR showed vascular congestion with possible edema vs atypical infection. CTA chest was negative for PE with interstitial lung disease noted as well as coronary artery calcification and aortic atherosclerosis. Labs showed HS-Tn 5 with a delta of 7, covid negative, BNP 58, unremarkable CBC, potassium 4.5, BUN 16, SCr 0.67. In the ED, he received ASA 162 mg x 1. Upon admission, cardiology was asked to evaluate. Currently, chest pain free.   Past Medical History:  Diagnosis Date  . Bradycardia    relative, precluding up titration of his beta-blocker  . CAD (coronary artery disease)   . HLD (hyperlipidemia)   . Non-ST elevated myocardial infarction (non-STEMI) ([place])    2/07: LHC with occluded OM, LAD 20% + 40%; normal LVF => PTCA of OM with wire passage alone   . PVC's (premature ventricular contractions)   . Syncope    hx    Past Surgical History:  Procedure Laterality Date  . CARDIAC CATHETERIZATION    . coronary arteriogram    . left ventriculogram    . reperfusion of obtuse marginal with a wife alone       Home Meds: Prior to Admission medications   Medication Sig Start Date End Date Taking? Authorizing Provider  aspirin EC 81 MG tablet Take 1 tablet (81 mg total) by mouth daily. 03/21/17   Fay Records, MD  BEE POLLEN PO Take 1 capsule by mouth daily.  [provider]  ezetimibe (ZETIA) 10 MG tablet Take 0.5 tablets (5 mg total) by mouth daily. 03/22/17   Pricilla Riffle, MD  metoprolol succinate (TOPROL-XL) 25 MG 24 hr tablet Take 1 tablet (25 mg total) by mouth daily. Please make yearly appt with Dr. Tenny Craw for June for future refills. 1st attempt 09/07/19   Pricilla Riffle, MD  nitroGLYCERIN (NITROSTAT) 0.4 MG SL tablet place 1 tablet under the tongue if needed every 5 minutes for chest pain for 3 doses IF NO RELIEF AFTER 3RD DOSE CALL PRESCRIBER OR 911. 10/24/18   Pricilla Riffle, MD  simvastatin (ZOCOR) 40 MG tablet  TAKE 1 TABLET BY MOUTH EVERY DAY 06/09/19   Pricilla Riffle, MD    Inpatient Medications: Scheduled Meds: . sodium chloride flush  3 mL Intravenous Once   Continuous Infusions:  PRN Meds:   Allergies:   Allergies  Allergen Reactions  . Other Other (See Comments)    Tetanus shot  . Tetanus Toxoid, Adsorbed     Tetanus shot  . Tetanus Toxoids     Tetanus shot    Social History:   Social History   Socioeconomic History  . Marital status: Divorced    Spouse name: Not on file  . Number of children: 3  . Years of education: Not on file  . Highest education level: Not on file  Occupational History  . Not on file  Tobacco Use  . Smoking status: Never Smoker  . Smokeless tobacco: Former Neurosurgeon    Types: Chew  Substance and Sexual Activity  . Alcohol use: No    Alcohol/week: 0.0 standard drinks  . Drug use: No  . Sexual activity: Not on file  Other Topics Concern  . Not on file  Social History Narrative   He is married, divorced, and now leaving with his ex-wife. Continues to drive tractor-trailers.    Social Determinants of Health   Financial Resource Strain:   . Difficulty of Paying Living Expenses:   Food Insecurity:   . Worried About Programme researcher, broadcasting/film/video in the Last Year:   . Barista in the Last Year:   Transportation Needs:   . Freight forwarder (Medical):   Marland Kitchen Lack of Transportation (Non-Medical):   Physical Activity:   . Days of Exercise per Week:   . Minutes of Exercise per Session:   Stress:   . Feeling of Stress :   Social Connections:   . Frequency of Communication with Friends and Family:   . Frequency of Social Gatherings with Friends and Family:   . Attends Religious Services:   . Active Member of Clubs or Organizations:   . Attends Banker Meetings:   Marland Kitchen Marital Status:   Intimate Partner Violence:   . Fear of Current or Ex-Partner:   . Emotionally Abused:   Marland Kitchen Physically Abused:   . Sexually Abused:      Family  History:   Family History  Problem Relation Age of Onset  . Heart attack Mother   . Heart attack Father     ROS:  Review of Systems  Constitutional: Positive for malaise/fatigue. Negative for chills, diaphoresis, fever and weight loss.  HENT: Negative for congestion.   Eyes: Negative for discharge and redness.  Respiratory: Negative for cough, sputum production, shortness of breath and wheezing.   Cardiovascular: Positive for chest pain. Negative for palpitations, orthopnea, claudication, leg swelling and PND.  Gastrointestinal: Negative for abdominal pain, heartburn,  nausea and vomiting.  Musculoskeletal: Negative for falls and myalgias.  Skin: Negative for rash.  Neurological: Positive for weakness. Negative for dizziness, tingling, tremors, sensory change, speech change, focal weakness and loss of consciousness.  Endo/Heme/Allergies: Does not bruise/bleed easily.  Psychiatric/Behavioral: Negative for substance abuse. The patient is not nervous/anxious.   All other systems reviewed and are negative.     Physical Exam/Data:   Vitals:   09/21/19 0500 09/21/19 0650 09/21/19 0700 09/21/19 0715  BP: (!) 170/83 (!) 143/88 (!) 141/67   Pulse: 67 (!) 59 (!) 57 (!) 57  Resp: 17 17 (!) 29 18  Temp:      SpO2: 99% 100% 99% 99%  Weight:      Height:       No intake or output data in the 24 hours ending 09/21/19 0754 Filed Weights   09/21/19 0452  Weight: 78 kg   Body mass index is 26.94 kg/m.   Physical Exam: General: Well developed, well nourished, in no acute distress. Head: Normocephalic, atraumatic, sclera non-icteric, no xanthomas, nares without discharge.  Neck: Negative for carotid bruits. JVD not elevated. Lungs: Clear bilaterally to auscultation without wheezes, rales, or rhonchi. Breathing is unlabored. Heart: RRR with S1 S2. II/VI systolic murmur LUSB, no rubs, or gallops appreciated. Abdomen: Soft, non-tender, non-distended with normoactive bowel sounds. No  hepatomegaly. No rebound/guarding. No obvious abdominal masses. Msk:  Strength and tone appear normal for age. Extremities: No clubbing or cyanosis. No edema. Distal pedal pulses are 2+ and equal bilaterally. Neuro: Alert and oriented X 3. No facial asymmetry. No focal deficit. Moves all extremities spontaneously. Psych:  Responds to questions appropriately with a normal affect.   EKG:  The EKG was personally reviewed and demonstrates: NSR, 62 bpm, rare PVC, no acute st/t changes Telemetry:  Telemetry was personally reviewed and demonstrates: SR  Weights: [employer]   09/21/19 0452  Weight: 78 kg    Relevant CV Studies:  2D echo 06/2010: - Left ventricle: The cavity size was normal. There was mild  concentric hypertrophy. Systolic function was normal. The  estimated ejection fraction was in the range of 50% to 55%. Wall  motion was normal; there were no regional wall motion  abnormalities. Doppler parameters are consistent with abnormal  left ventricular relaxation (grade 1 diastolic dysfunction).  - Aortic valve: Trileaflet; normal thickness, mildly calcified  leaflets.  - Left atrium: The atrium was normal in size.  - Tricuspid valve: Mild regurgitation.  - Pulmonary arteries: Systolic pressure was within the normal range.  - Inferior vena cava: The vessel was normal in size.  Impressions:   - Rare PVC noted.    Laboratory Data:  Chemistry Recent Labs  Lab 09/21/19 0516  NA 138  K 4.5  CL 101  CO2 30  GLUCOSE 132*  BUN 16  CREATININE 0.67  CALCIUM 9.2  GFRNONAA >60  GFRAA >60  ANIONGAP 7    No results for input(s): PROT, ALBUMIN, AST, ALT, ALKPHOS, BILITOT in the last 168 hours. Hematology Recent Labs  Lab 09/21/19 0516  WBC 9.4  RBC 4.41  HGB 15.0  HCT 43.3  MCV 98.2  MCH 34.0  MCHC 34.6  RDW 12.0  PLT 213   Cardiac EnzymesNo results for input(s): TROPONINI in the last 168 hours. No results for input(s): TROPIPOC in the last 168  hours.  BNP Recent Labs  Lab 09/21/19 0516  BNP 58.0    DDimer No results for input(s): DDIMER in the last 168 hours.  Radiology/Studies:  CT Angio Chest PE W and/or Wo Contrast  Result Date: 09/21/2019 IMPRESSION: 1. Negative for pulmonary embolism or other acute finding. 2. Interstitial lung disease. 3.  Aortic Atherosclerosis (ICD10-I70.0).  Coronary atherosclerosis. 4. Aortic valve calcification. Electronically Signed   By: Marnee Spring M.D.   On: 09/21/2019 06:24   DG Chest Port 1 View  Result Date: 09/21/2019 IMPRESSION: Hazy and interstitial opacities with vascular congestion likely reflect edema though atypical infection could have a similar appearance in the appropriate clinical setting. Electronically Signed   By: Kreg Shropshire M.D.   On: 09/21/2019 05:22    Assessment and Plan:   1. CAD involving the native coronary arteries with chest pain with moderate risk for cardiac etiology: -HS-Tn negative x 2 -EKG nonacute -Currently, chest pain-free -ASA, metoprolol, simvastatin -N.p.o. -Lexiscan MPI to evaluate for high risk ischemia, further recommendations pending this  2. HTN: -Blood pressure 140s to 150s systolic -Metoprolol as above -Titrate as indicated  3. HLD: -LDL of 78 from 10/2018 with goal LDL < 70 -Check FLP/LFT -Remains on simvastatin and Zetia -Escalate lipid therapy as indicated     For questions or updates, please contact [facility] Please consult [email or web address] for contact info under Cardiology/STEMI.   Signed, Eula Listen, PA-C [facility] Pager: [phone] 09/21/2019, 7:54 AM

## 2019-09-21 NOTE — ED Notes (Signed)
Pt returned to room from imaging. 

## 2019-09-21 NOTE — H&P (Signed)
History and Physical    [name] [name] BTD:[id number] DOB: [birth year] DOA: 09/21/2019  PCP: Gracelyn Nurse, MD   Patient coming from: Home  I have personally briefly reviewed patient's old medical records in [facility]  Chief Complaint: Chest pain  HPI: [name] [name] is a 86 y.o. male with medical history significant for coronary artery disease who presents to the emergency room for evaluation of chest pain mostly in the center of his chest with radiation to his back and left arm.  Chest pain woke him up out of his sleep at about 2:30 AM.  Chest pain is associated with dyspnea and nausea but patient denies having any vomiting, diaphoresis or palpitations.  Chest pain was relieved by nitroglycerin.  Patient states that he had an MI about 20 years ago and had a cardiac catheterization done at [facility].  He did not require any stents at that time Twelve-lead EKG revealed sinus rhythm with fusion complexes Initial troponin was negative Chest x ray showed hazy and interstitial opacities with vascular congestion likely reflect edema though atypical infection could have a similar appearance in the appropriate clinical setting. CT scan of the chest was negative for pulmonary embolism or other acute finding. Interstitial lung disease. Patient is currently chest pain free  ED Course: 86 year old male with history of coronary artery disease who presents for evaluation of chest pain that was mostly central with radiation to the left arm and back.  Pain was relieved by nitroglycerin.  Will refer to observation status.    Review of Systems: As per HPI otherwise 10 point review of systems negative.    Past Medical History:  Diagnosis Date  . Bradycardia    relative, precluding up titration of his beta-blocker  . CAD (coronary artery disease)   . HLD (hyperlipidemia)   . Non-ST elevated myocardial infarction (non-STEMI) (HCC)    2/07: LHC with occluded OM, LAD 20% + 40%; normal LVF =>  PTCA of OM with wire passage alone   . PVC's (premature ventricular contractions)   . Syncope    hx    Past Surgical History:  Procedure Laterality Date  . CARDIAC CATHETERIZATION    . coronary arteriogram    . left ventriculogram    . reperfusion of obtuse marginal with a wife alone       reports that he has never smoked. He has quit using smokeless tobacco.  His smokeless tobacco use included chew. He reports that he does not drink alcohol or use drugs.  Allergies  Allergen Reactions  . Other Other (See Comments)    Tetanus shot  . Tetanus Toxoid, Adsorbed     Tetanus shot  . Tetanus Toxoids     Tetanus shot    Family History  Problem Relation Age of Onset  . Heart attack Mother   . Heart attack Father      Prior to Admission medications   Medication Sig Start Date End Date Taking? Authorizing Provider  aspirin EC 81 MG tablet Take 1 tablet (81 mg total) by mouth daily. 03/21/17   Pricilla Riffle, MD  BEE POLLEN PO Take 1 capsule by mouth daily.    [provider]  ezetimibe (ZETIA) 10 MG tablet Take 0.5 tablets (5 mg total) by mouth daily. 03/22/17   Pricilla Riffle, MD  metoprolol succinate (TOPROL-XL) 25 MG 24 hr tablet Take 1 tablet (25 mg total) by mouth daily. Please make yearly appt with Dr. Tenny Craw for June for  future refills. 1st attempt 09/07/19   Fay Records, MD  nitroGLYCERIN (NITROSTAT) 0.4 MG SL tablet place 1 tablet under the tongue if needed every 5 minutes for chest pain for 3 doses IF NO RELIEF AFTER 3RD DOSE CALL PRESCRIBER OR 911. 10/24/18   Fay Records, MD  simvastatin (ZOCOR) 40 MG tablet TAKE 1 TABLET BY MOUTH EVERY DAY 06/09/19   Fay Records, MD    Physical Exam: Vitals:   09/21/19 0500 09/21/19 0650 09/21/19 0700 09/21/19 0715  BP: (!) 170/83 (!) 143/88 (!) 141/67   Pulse: 67 (!) 59 (!) 57 (!) 57  Resp: 17 17 (!) 29 18  Temp:      SpO2: 99% 100% 99% 99%  Weight:      Height:         Vitals:   09/21/19 0500 09/21/19 0650 09/21/19  0700 09/21/19 0715  BP: (!) 170/83 (!) 143/88 (!) 141/67   Pulse: 67 (!) 59 (!) 57 (!) 57  Resp: 17 17 (!) 29 18  Temp:      SpO2: 99% 100% 99% 99%  Weight:      Height:        Constitutional: NAD, alert and oriented x 3 Eyes: PERRL, lids and conjunctivae normal ENMT: Mucous membranes are moist.  Neck: normal, supple, no masses, no thyromegaly Respiratory: clear to auscultation bilaterally, no wheezing, no crackles. Normal respiratory effort. No accessory muscle use.  Cardiovascular: Regular rate and rhythm, no murmurs / rubs / gallops. No extremity edema. 2+ pedal pulses. No carotid bruits.  Abdomen: no tenderness, no masses palpated. No hepatosplenomegaly. Bowel sounds positive.  Musculoskeletal: no clubbing / cyanosis. No joint deformity upper and lower extremities.  Skin: no rashes, lesions, ulcers.  Neurologic: No gross focal neurologic deficit. Psychiatric: Normal mood and affect.   Labs on Admission: I have personally reviewed following labs and imaging studies  CBC: Recent Labs  Lab 09/21/19 0516  WBC 9.4  HGB 15.0  HCT 43.3  MCV 98.2  PLT 440   Basic Metabolic Panel: Recent Labs  Lab 09/21/19 0516  NA 138  K 4.5  CL 101  CO2 30  GLUCOSE 132*  BUN 16  CREATININE 0.67  CALCIUM 9.2   GFR: Estimated Creatinine Clearance: 65.4 mL/min (by C-G formula based on SCr of 0.67 mg/dL). Liver Function Tests: No results for input(s): AST, ALT, ALKPHOS, BILITOT, PROT, ALBUMIN in the last 168 hours. No results for input(s): LIPASE, AMYLASE in the last 168 hours. No results for input(s): AMMONIA in the last 168 hours. Coagulation Profile: No results for input(s): INR, PROTIME in the last 168 hours. Cardiac Enzymes: No results for input(s): CKTOTAL, CKMB, CKMBINDEX, TROPONINI in the last 168 hours. BNP (last 3 results) No results for input(s): PROBNP in the last 8760 hours. HbA1C: No results for input(s): HGBA1C in the last 72 hours. CBG: No results for input(s):  GLUCAP in the last 168 hours. Lipid Profile: No results for input(s): CHOL, HDL, LDLCALC, TRIG, CHOLHDL, LDLDIRECT in the last 72 hours. Thyroid Function Tests: No results for input(s): TSH, T4TOTAL, FREET4, T3FREE, THYROIDAB in the last 72 hours. Anemia Panel: No results for input(s): VITAMINB12, FOLATE, FERRITIN, TIBC, IRON, RETICCTPCT in the last 72 hours. Urine analysis: No results found for: COLORURINE, APPEARANCEUR, LABSPEC, PHURINE, GLUCOSEU, HGBUR, BILIRUBINUR, KETONESUR, PROTEINUR, UROBILINOGEN, NITRITE, LEUKOCYTESUR  Radiological Exams on Admission: CT Angio Chest PE W and/or Wo Contrast  Result Date: 09/21/2019 CLINICAL DATA:  Shortness of breath and chest pain EXAM:  CT ANGIOGRAPHY CHEST WITH CONTRAST TECHNIQUE: Multidetector CT imaging of the chest was performed using the standard protocol during bolus administration of intravenous contrast. Multiplanar CT image reconstructions and MIPs were obtained to evaluate the vascular anatomy. CONTRAST:  78mL OMNIPAQUE IOHEXOL 350 MG/ML SOLN COMPARISON:  Radiography from earlier today FINDINGS: Cardiovascular: Satisfactory opacification of the pulmonary arteries to the segmental level. No evidence of pulmonary embolism. Normal heart size. No pericardial effusion. Aortic and coronary atherosclerotic calcification. Aortic valve calcification. Mediastinum/Nodes: Negative for adenopathy or mass. Lungs/Pleura: Subpleural reticulation throughout the lungs without honeycombing. No ground-glass or consolidative opacity. No edema, effusion, or pneumothorax. Upper Abdomen: Clips close to the GE junction. 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 There is lobulation of the dilated more distal celiac, also chronic appearing Musculoskeletal: No acute finding Review of the MIP images confirms the above findings. IMPRESSION: 1. Negative for pulmonary embolism or other acute finding. 2. Interstitial lung  disease. 3.  Aortic Atherosclerosis (ICD10-I70.0).  Coronary atherosclerosis. 4. Aortic valve calcification. Electronically Signed   By: Marnee Spring M.D.   On: 09/21/2019 06:24   DG Chest Port 1 View  Result Date: 09/21/2019 CLINICAL DATA:  Left chest pain, upper back pain and pain radiating down the left arm EXAM: PORTABLE CHEST 1 VIEW COMPARISON:  Radiograph 07/07/2005 FINDINGS: There is increased central vascular congestion with diffuse hazy and reticular interstitial opacities with a slight basilar gradient. No pneumothorax or effusion. Chronic elevation of the right hemidiaphragm. Stable cardiomediastinal contours with a calcified aorta. No acute osseous or soft tissue abnormality. Degenerative changes are present in the imaged spine and shoulders. Redemonstration of surgical clips at the GE junction. Telemetry leads overlie the chest. IMPRESSION: Hazy and interstitial opacities with vascular congestion likely reflect edema though atypical infection could have a similar appearance in the appropriate clinical setting. Electronically Signed   By: Kreg Shropshire M.D.   On: 09/21/2019 05:22    EKG: Independently reviewed.  Sinus rhythm Fusion complexes  Assessment/Plan Principal Problem:   Ischemic chest pain (HCC) Active Problems:   CAD (coronary artery disease)    Ischemic chest pain In a patient with known coronary artery disease who presents for evaluation of chest pain mostly midsternal with radiation to the left arm associated with shortness of breath and nausea. Continue aspirin, metoprolol, statins and as needed nitroglycerin We will obtain nuclear stress test Cardiology consult  DVT prophylaxis: Lovenox Code Status: Full code Family Communication: Greater than 50% of time was spent discussing plan of care with patient at the bedside.  All questions concerns were addressed. Disposition Plan: Back to previous home environment Consults called: Cardiology    Forrestine Lecrone  MD [facility]     09/21/2019, 8:00 AM

## 2019-09-21 NOTE — Discharge Summary (Signed)
Physician Discharge Summary  [name] [name] WUJ:[id number] DOB: 01/09/1934 DOA: 09/21/2019  PCP: Gracelyn NurseJohnston, John D, MD  Admit date: 09/21/2019 Discharge date: 09/21/2019  Admitted From: Home  Disposition:  Home  Recommendations for Outpatient Follow-up:  1. Follow up with PCP in 1-2 weeks 2. Please obtain BMP/CBC in one week 3. Please follow up on the following pending results:  Home Health: No (YES/NO) Equipment/Devices:None  Discharge Condition: Stable CODE STATUS: FULL,  Diet recommendation: Heart Healthy    Brief/Interim Summary: [name] Ramprnest B Straker is a 86 y.o. male with medical history significant for coronary artery disease who presents to the emergency room for evaluation of chest pain mostly in the center of his chest with radiation to his back and left arm.  Chest pain woke him up out of his sleep at about 2:30 AM.  Chest pain is associated with dyspnea and nausea but patient denies having any vomiting, diaphoresis or palpitations.  Chest pain was relieved by nitroglycerin.  Patient states that he had an MI about 20 years ago and had a cardiac catheterization done [facility].  He did not require any stents at that time Twelve-lead EKG revealed sinus rhythm with fusion complexes Initial troponin was negative Chest x ray showed hazy and interstitial opacities with vascular congestion likely reflect edema though atypical infection could have a similar appearance in the appropriate clinical setting. CT scan of the chest was negative for pulmonary embolism or other acute finding. Interstitial lung disease. Patient is currently chest pain free Patient had a Lexiscan stress test which was mildly abnormal but similar to his most recent one which was done in 2008 and consistent with his previous myocardial infarction.  2D echocardiogram showed moderate aortic stenosis. Patient is cleared for discharge by cardiology to follow-up with his primary care provider as an outpatient  Discharge  Diagnoses:  Principal Problem:   Ischemic chest pain [facility](HCC) Active Problems:   CAD (coronary artery disease)    Discharge Instructions  Discharge Instructions    Diet - low sodium heart healthy   Complete by: As directed    Discharge instructions   Complete by: As directed    Take medications as recommended Return to emergency room for worsening chest pain Keep scheduled follow-up appointment with primary care provider   Increase activity slowly   Complete by: As directed      Allergies as of 09/21/2019      Reactions   Other Other (See Comments)   Tetanus shot   Tetanus Toxoid, Adsorbed    Tetanus shot   Tetanus Toxoids    Tetanus shot      Medication List    TAKE these medications   aspirin EC 81 MG tablet Take 1 tablet (81 mg total) by mouth daily.   BEE POLLEN PO Take 1 capsule by mouth daily.   metoprolol succinate 25 MG 24 hr tablet Commonly known as: TOPROL-XL Take 1 tablet (25 mg total) by mouth daily. Please make yearly appt with Dr. Tenny Crawoss for June for future refills. 1st attempt What changed:   when to take this  additional instructions   nitroGLYCERIN 0.4 MG SL tablet Commonly known as: Nitrostat place 1 tablet under the tongue if needed every 5 minutes for chest pain for 3 doses IF NO RELIEF AFTER 3RD DOSE CALL PRESCRIBER OR 911.   simvastatin 40 MG tablet Commonly known as: ZOCOR TAKE 1 TABLET BY MOUTH EVERY DAY What changed: when to take this  Allergies  Allergen Reactions  . Other Other (See Comments)    Tetanus shot  . Tetanus Toxoid, Adsorbed     Tetanus shot  . Tetanus Toxoids     Tetanus shot    Consultations: [facility]  Procedures/Studies: CT Angio Chest PE W and/or Wo Contrast  Result Date: 09/21/2019 CLINICAL DATA:  Shortness of breath and chest pain EXAM: CT ANGIOGRAPHY CHEST WITH CONTRAST TECHNIQUE: Multidetector CT imaging of the chest was performed using the standard protocol during bolus  administration of intravenous contrast. Multiplanar CT image reconstructions and MIPs were obtained to evaluate the vascular anatomy. CONTRAST:  2mL OMNIPAQUE IOHEXOL 350 MG/ML SOLN COMPARISON:  Radiography from earlier today FINDINGS: Cardiovascular: Satisfactory opacification of the pulmonary arteries to the segmental level. No evidence of pulmonary embolism. Normal heart size. No pericardial effusion. Aortic and coronary atherosclerotic calcification. Aortic valve calcification. Mediastinum/Nodes: Negative for adenopathy or mass. Lungs/Pleura: Subpleural reticulation throughout the lungs without honeycombing. No ground-glass or consolidative opacity. No edema, effusion, or pneumothorax. Upper Abdomen: Clips close to the GE junction. 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 There is lobulation of the dilated more distal celiac, also chronic appearing Musculoskeletal: No acute finding Review of the MIP images confirms the above findings. IMPRESSION: 1. Negative for pulmonary embolism or other acute finding. 2. Interstitial lung disease. 3.  Aortic Atherosclerosis (ICD10-I70.0).  Coronary atherosclerosis. 4. Aortic valve calcification. Electronically Signed   By: Marnee Spring M.D.   On: 09/21/2019 06:24   NM Myocar Multi W/Spect W/Wall Motion / EF  Result Date: 09/21/2019  T wave inversion was noted during stress in the III leads.  There was no ST segment deviation noted during stress.  Defect 1: There is a medium defect of severe severity present in the mid inferolateral and apical lateral location.  Findings consistent with prior myocardial infarction with mild peri-infarct ischemia.  This is an intermediate risk study.  Nuclear stress EF: 50%.    DG Chest Port 1 View  Result Date: 09/21/2019 CLINICAL DATA:  Left chest pain, upper back pain and pain radiating down the left arm EXAM: PORTABLE CHEST 1 VIEW COMPARISON:  Radiograph 07/07/2005  FINDINGS: There is increased central vascular congestion with diffuse hazy and reticular interstitial opacities with a slight basilar gradient. No pneumothorax or effusion. Chronic elevation of the right hemidiaphragm. Stable cardiomediastinal contours with a calcified aorta. No acute osseous or soft tissue abnormality. Degenerative changes are present in the imaged spine and shoulders. Redemonstration of surgical clips at the GE junction. Telemetry leads overlie the chest. IMPRESSION: Hazy and interstitial opacities with vascular congestion likely reflect edema though atypical infection could have a similar appearance in the appropriate clinical setting. Electronically Signed   By: Kreg Shropshire M.D.   On: 09/21/2019 05:22   ECHOCARDIOGRAM COMPLETE  Result Date: 09/21/2019    ECHOCARDIOGRAM REPORT   Patient Name:   [name] [name] Date of Exam: 09/21/2019 Medical Rec #:  [id number]       Height:       67.0 in Accession #:    [id number]      Weight:       170.5 lb Date of Birth:  09-Dec-1933       BSA:          1.890 m Patient Age:    85 years        BP:           124/74 mmHg Patient  Gender: M               HR:           59 bpm. Exam Location:  [facility] Procedure: 2D Echo, Cardiac Doppler and Color Doppler Indications:     Chest pain 786.50  History:         Patient has no prior history of Echocardiogram examinations.                  CAD; Risk Factors:Dyslipidemia.  Sonographer:     Cristela Blue RDCS (AE) Referring Phys:  [id number] Lucile Shutters Diagnosing Phys: Lorine Bears MD IMPRESSIONS  1. Left ventricular ejection fraction, by estimation, is 50 to 55%. The left ventricle has low normal function. The left ventricle has no regional wall motion abnormalities. There is mild left ventricular hypertrophy. Left ventricular diastolic parameters are consistent with Grade I diastolic dysfunction (impaired relaxation).  2. Right ventricular systolic function is normal. The right ventricular size is normal. Tricuspid  regurgitation signal is inadequate for assessing PA pressure.  3. Left atrial size was mildly dilated.  4. The mitral valve is abnormal. Mild mitral valve regurgitation. No evidence of mitral stenosis.  5. The aortic valve is abnormal. Aortic valve regurgitation is not visualized. Moderate aortic valve stenosis. Aortic valve mean gradient measures 13.3 mmHg. Aortic valve peak gradient measures 21.8 mmHg. Aortic valve area, by VTI measures 1.41 cm. FINDINGS  Left Ventricle: Left ventricular ejection fraction, by estimation, is 50 to 55%. The left ventricle has low normal function. The left ventricle has no regional wall motion abnormalities. The left ventricular internal cavity size was normal in size. There is mild left ventricular hypertrophy. Left ventricular diastolic parameters are consistent with Grade I diastolic dysfunction (impaired relaxation). Right Ventricle: The right ventricular size is normal. No increase in right ventricular wall thickness. Right ventricular systolic function is normal. Tricuspid regurgitation signal is inadequate for assessing PA pressure. The tricuspid regurgitant velocity is 2.42 m/s, and with an assumed right atrial pressure of 10 mmHg, the estimated right ventricular systolic pressure is 33.4 mmHg. Left Atrium: Left atrial size was mildly dilated. Right Atrium: Right atrial size was normal in size. Pericardium: There is no evidence of pericardial effusion. Mitral Valve: The mitral valve is abnormal. There is mild thickening of the mitral valve leaflet(s). Normal mobility of the mitral valve leaflets. Mild mitral valve regurgitation. No evidence of mitral valve stenosis. Tricuspid Valve: The tricuspid valve is normal in structure. Tricuspid valve regurgitation is not demonstrated. No evidence of tricuspid stenosis. Aortic Valve: The aortic valve is abnormal. Aortic valve regurgitation is not visualized. Moderate aortic stenosis is present. Aortic valve mean gradient measures 13.3  mmHg. Aortic valve peak gradient measures 21.8 mmHg. Aortic valve area, by VTI measures 1.41 cm. Pulmonic Valve: The pulmonic valve was normal in structure. Pulmonic valve regurgitation is not visualized. No evidence of pulmonic stenosis. Aorta: The aortic root is normal in size and structure. Venous: The inferior vena cava was not well visualized. IAS/Shunts: No atrial level shunt detected by color flow Doppler.  LEFT VENTRICLE PLAX 2D LVIDd:         4.85 cm  Diastology LVIDs:         3.52 cm  LV e' lateral:   5.33 cm/s LV PW:         1.25 cm  LV E/e' lateral: 10.3 LV IVS:        0.94 cm  LV e' medial:    4.46 cm/s  LVOT diam:     2.50 cm  LV E/e' medial:  12.3 LV SV:         77 LV SV Index:   41 LVOT Area:     4.91 cm  RIGHT VENTRICLE RV Basal diam:  3.80 cm RV S prime:     14.40 cm/s TAPSE (M-mode): 4.0 cm LEFT ATRIUM             Index       RIGHT ATRIUM           Index LA diam:        4.00 cm 2.12 cm/m  RA Area:     15.90 cm LA Vol (A2C):   50.2 ml 26.57 ml/m RA Volume:   41.60 ml  22.01 ml/m LA Vol (A4C):   47.0 ml 24.87 ml/m LA Biplane Vol: 53.5 ml 28.31 ml/m  AORTIC VALVE                    PULMONIC VALVE AV Area (Vmax):    1.45 cm     PV Vmax:        0.85 m/s AV Area (Vmean):   1.30 cm     PV Peak grad:   2.9 mmHg AV Area (VTI):     1.41 cm     RVOT Peak grad: 4 mmHg AV Vmax:           233.67 cm/s AV Vmean:          173.333 cm/s AV VTI:            0.544 m AV Peak Grad:      21.8 mmHg AV Mean Grad:      13.3 mmHg LVOT Vmax:         69.20 cm/s LVOT Vmean:        45.800 cm/s LVOT VTI:          0.156 m LVOT/AV VTI ratio: 0.29  AORTA Ao Root diam: 3.10 cm MITRAL VALVE                TRICUSPID VALVE MV Area (PHT): 2.10 cm     TR Peak grad:   23.4 mmHg MV Decel Time: 362 msec     TR Vmax:        242.00 cm/s MV E velocity: 54.80 cm/s MV A velocity: 103.00 cm/s  SHUNTS MV E/A ratio:  0.53         Systemic VTI:  0.16 m                             Systemic Diam: 2.50 cm Lorine Bears MD Electronically  signed by Lorine Bears MD Signature Date/Time: 09/21/2019/3:36:20 PM    Final     (Echo, Carotid, EGD, Colonoscopy, ERCP)    Subjective:   Discharge Exam: Vitals:   09/21/19 1304 09/21/19 1548  BP: (!) 143/70 (!) 125/99  Pulse: (!) 59 64  Resp: 19 19  Temp: 98.1 F (36.7 C) 97.7 F (36.5 C)  SpO2: 99% 98%   Vitals:   09/21/19 1206 09/21/19 1304 09/21/19 1304 09/21/19 1548  BP: (!) 123/97  (!) 143/70 (!) 125/99  Pulse: 71  (!) 59 64  Resp: 18  19 19   Temp:   98.1 F (36.7 C) 97.7 F (36.5 C)  TempSrc:   Oral Oral  SpO2: 98%  99% 98%  Weight:  77.3 kg    Height:  5\' 7"  (1.702 m)      General: Pt is alert, awake, not in acute distress Cardiovascular: RRR, S1/S2 +, no rubs, no gallops Respiratory: CTA bilaterally, no wheezing, no rhonchi Abdominal: Soft, NT, ND, bowel sounds + Extremities: no edema, no cyanosis    The results of significant diagnostics from this hospitalization (including imaging, microbiology, ancillary and laboratory) are listed below for reference.     Microbiology: Recent Results (from the past 240 hour(s))  Respiratory Panel by RT PCR (Flu A&B, Covid) - Nasopharyngeal Swab     Status: None   Collection Time: 09/21/19  5:47 AM   Specimen: Nasopharyngeal Swab  Result Value Ref Range Status   SARS Coronavirus 2 by RT PCR NEGATIVE NEGATIVE Final    Comment: (NOTE) SARS-CoV-2 target nucleic acids are NOT DETECTED. The SARS-CoV-2 RNA is generally detectable in upper respiratoy specimens during the acute phase of infection. The lowest concentration of SARS-CoV-2 viral copies this assay can detect is 131 copies/mL. A negative result does not preclude SARS-Cov-2 infection and should not be used as the sole basis for treatment or other patient management decisions. A negative result may occur with  improper specimen collection/handling, submission of specimen other than nasopharyngeal swab, presence of viral mutation(s) within the areas targeted  by this assay, and inadequate number of viral copies (<131 copies/mL). A negative result must be combined with clinical observations, patient history, and epidemiological information. The expected result is Negative. Fact Sheet for Patients:  11/21/19 Fact Sheet for Healthcare Providers:  https://www.moore.com/ This test is not yet ap proved or cleared by the https://www.young.biz/ FDA and  has been authorized for detection and/or diagnosis of SARS-CoV-2 by FDA under an Emergency Use Authorization (EUA). This EUA will remain  in effect (meaning this test can be used) for the duration of the COVID-19 declaration under Section 564(b)(1) of the Act, 21 U.S.C. section 360bbb-3(b)(1), unless the authorization is terminated or revoked sooner.    Influenza A by PCR NEGATIVE NEGATIVE Final   Influenza B by PCR NEGATIVE NEGATIVE Final    Comment: (NOTE) The Xpert Xpress SARS-CoV-2/FLU/RSV assay is intended as an aid in  the diagnosis of influenza from Nasopharyngeal swab specimens and  should not be used as a sole basis for treatment. Nasal washings and  aspirates are unacceptable for Xpert Xpress SARS-CoV-2/FLU/RSV  testing. Fact Sheet for Patients: [place] Fact Sheet for Healthcare Providers: https://www.moore.com/ This test is not yet approved or cleared by the https://www.young.biz/ FDA and  has been authorized for detection and/or diagnosis of SARS-CoV-2 by  FDA under an Emergency Use Authorization (EUA). This EUA will remain  in effect (meaning this test can be used) for the duration of the  Covid-19 declaration under Section 564(b)(1) of the Act, 21  U.S.C. section 360bbb-3(b)(1), unless the authorization is  terminated or revoked. Performed at [facility], [address] [place], [place]      Labs: BNP (last 3 results) Recent Labs    09/21/19 0516  BNP 58.0    Basic Metabolic Panel: Recent Labs  Lab 09/21/19 0516  NA 138  K 4.5  CL 101  CO2 30  GLUCOSE 132*  BUN 16  CREATININE 0.67  CALCIUM 9.2   Liver Function Tests: No results for input(s): AST, ALT, ALKPHOS, BILITOT, PROT, ALBUMIN in the last 168 hours. No results for input(s): LIPASE, AMYLASE in the last 168 hours. No results for input(s): AMMONIA in the last 168 hours. CBC: Recent Labs  Lab  09/21/19 0516  WBC 9.4  HGB 15.0  HCT 43.3  MCV 98.2  PLT 213   Cardiac Enzymes: No results for input(s): CKTOTAL, CKMB, CKMBINDEX, TROPONINI in the last 168 hours. BNP: Invalid input(s): POCBNP CBG: No results for input(s): GLUCAP in the last 168 hours. D-Dimer No results for input(s): DDIMER in the last 72 hours. Hgb A1c No results for input(s): HGBA1C in the last 72 hours. Lipid Profile No results for input(s): CHOL, HDL, LDLCALC, TRIG, CHOLHDL, LDLDIRECT in the last 72 hours. Thyroid function studies No results for input(s): TSH, T4TOTAL, T3FREE, THYROIDAB in the last 72 hours.  Invalid input(s): FREET3 Anemia work up No results for input(s): VITAMINB12, FOLATE, FERRITIN, TIBC, IRON, RETICCTPCT in the last 72 hours. Urinalysis No results found for: COLORURINE, APPEARANCEUR, LABSPEC, PHURINE, GLUCOSEU, HGBUR, BILIRUBINUR, KETONESUR, PROTEINUR, UROBILINOGEN, NITRITE, LEUKOCYTESUR Sepsis Labs Invalid input(s): PROCALCITONIN,  WBC,  LACTICIDVEN Microbiology Recent Results (from the past 240 hour(s))  Respiratory Panel by RT PCR (Flu A&B, Covid) - Nasopharyngeal Swab     Status: None   Collection Time: 09/21/19  5:47 AM   Specimen: Nasopharyngeal Swab  Result Value Ref Range Status   SARS Coronavirus 2 by RT PCR NEGATIVE NEGATIVE Final    Comment: (NOTE) SARS-CoV-2 target nucleic acids are NOT DETECTED. The SARS-CoV-2 RNA is generally detectable in upper respiratoy specimens during the acute phase of infection. The lowest concentration of SARS-CoV-2 viral copies this  assay can detect is 131 copies/mL. A negative result does not preclude SARS-Cov-2 infection and should not be used as the sole basis for treatment or other patient management decisions. A negative result may occur with  improper specimen collection/handling, submission of specimen other than nasopharyngeal swab, presence of viral mutation(s) within the areas targeted by this assay, and inadequate number of viral copies (<131 copies/mL). A negative result must be combined with clinical observations, patient history, and epidemiological information. The expected result is Negative. Fact Sheet for Patients:  https://www.moore.com/ Fact Sheet for Healthcare Providers:  https://www.young.biz/ This test is not yet ap proved or cleared by the [place] FDA and  has been authorized for detection and/or diagnosis of SARS-CoV-2 by FDA under an Emergency Use Authorization (EUA). This EUA will remain  in effect (meaning this test can be used) for the duration of the COVID-19 declaration under Section 564(b)(1) of the Act, 21 U.S.C. section 360bbb-3(b)(1), unless the authorization is terminated or revoked sooner.    Influenza A by PCR NEGATIVE NEGATIVE Final   Influenza B by PCR NEGATIVE NEGATIVE Final    Comment: (NOTE) The Xpert Xpress SARS-CoV-2/FLU/RSV assay is intended as an aid in  the diagnosis of influenza from Nasopharyngeal swab specimens and  should not be used as a sole basis for treatment. Nasal washings and  aspirates are unacceptable for Xpert Xpress SARS-CoV-2/FLU/RSV  testing. Fact Sheet for Patients: https://www.moore.com/ Fact Sheet for Healthcare Providers: https://www.young.biz/ This test is not yet approved or cleared by the [place] FDA and  has been authorized for detection and/or diagnosis of SARS-CoV-2 by  FDA under an Emergency Use Authorization (EUA). This EUA will remain  in  effect (meaning this test can be used) for the duration of the  Covid-19 declaration under Section 564(b)(1) of the Act, 21  U.S.C. section 360bbb-3(b)(1), unless the authorization is  terminated or revoked. Performed at [facility], [address] [place], [place] [address]      Time coordinating discharge: Over 30 minutes  SIGNED:   Lucile Shutters, MD  [facility] 09/21/2019, 4:00 PM Pager   If 7PM-7AM, please contact night-coverage [email or web address] Password [email or web address]

## 2019-09-21 NOTE — ED Notes (Signed)
Admission held at this time  per [facility]

## 2019-09-28 ENCOUNTER — Other Ambulatory Visit: Payer: Self-pay

## 2019-09-28 ENCOUNTER — Ambulatory Visit: Payer: Medicare Other | Admitting: Internal Medicine

## 2019-09-28 ENCOUNTER — Encounter: Payer: Self-pay | Admitting: Internal Medicine

## 2019-09-28 VITALS — BP 110/66 | HR 78 | Ht 67.0 in | Wt 172.4 lb

## 2019-09-28 DIAGNOSIS — E782 Mixed hyperlipidemia: Secondary | ICD-10-CM | POA: Diagnosis not present

## 2019-09-28 DIAGNOSIS — I2510 Atherosclerotic heart disease of native coronary artery without angina pectoris: Secondary | ICD-10-CM

## 2019-09-28 DIAGNOSIS — R3911 Hesitancy of micturition: Secondary | ICD-10-CM

## 2019-09-28 DIAGNOSIS — R5383 Other fatigue: Secondary | ICD-10-CM

## 2019-09-28 NOTE — Patient Instructions (Signed)
Medication Instructions:  No changes today *If you need a refill on your cardiac medications before your next appointment, please call your pharmacy*   Lab Work: Today: tsh, psa, lipids  If you have labs (blood work) drawn today and your tests are completely normal, you will receive your results only by: Marland Kitchen MyChart Message (if you have MyChart) OR . A paper copy in the mail If you have any lab test that is abnormal or we need to change your treatment, we will call you to review the results.   Testing/Procedures: None ordered   Other Instructions Start increasing activity at home. Call at the end of the week to let Dr. Tenny Craw know -  -is your energy picking up?  -are you having any more of that pain that took you to the hospital?

## 2019-09-28 NOTE — Progress Notes (Addendum)
Cardiology Office Note   Date:  09/28/2019   ID:  [name] [name], DOB [birth year], MRN [id number]  PCP:  [name] Hire, MD  Cardiologist:   Dorris Carnes, MD   F/u of CAD     History of Present Illness: [name] [name] is a 86 y.o. male with a history of CAD, s/p NSTEMI . [place] 2/07 demonstrated an occluded OM, mild nonobstructive disease in the LAD with 20 and 40% lesions and preserved LV function. He had return of perfusion in his OM with wire passage alone. No balloon angioplasty or stenting was required.  Nuclear study 4/08: Prior lateral MI with mild peri-infarct ischemia. Echocardiogram 2/12: Mild LVH, EF 66-44%, grade 1 diastolic dysfunction, mild TR. Holter monitor 2/13: Sinus rhythm, frequent PVCs, couplets and triplets, occasional PACs.   I saw the pt as a televsit last spring   Last week he woke up   Went to bathroom   Back to bed   He says he felt like someone hit him in back with shovel  Then he developed CP   Took NTG   Some easing   Felt numb   Went to [facility] CT was negative for PE   He ruled out for MI   Myovue done      Since d/c has not done much    Has had more SOB   Outpatient Medications Prior to Visit  Medication Sig Dispense Refill  . aspirin EC 81 MG tablet Take 1 tablet (81 mg total) by mouth daily.    Marland Kitchen BEE POLLEN PO Take 1 capsule by mouth daily.    . metoprolol succinate (TOPROL-XL) 25 MG 24 hr tablet Take 1 tablet (25 mg total) by mouth daily. Please make yearly appt with Dr. Harrington Challenger for June for future refills. 1st attempt (Patient taking differently: Take 25 mg by mouth at bedtime. ) 90 tablet 0  . nitroGLYCERIN (NITROSTAT) 0.4 MG SL tablet place 1 tablet under the tongue if needed every 5 minutes for chest pain for 3 doses IF NO RELIEF AFTER 3RD DOSE CALL PRESCRIBER OR 911. 25 tablet 2  . simvastatin (ZOCOR) 40 MG tablet TAKE 1 TABLET BY MOUTH EVERY DAY (Patient taking differently: Take 40 mg by mouth at bedtime. ) 90 tablet 1   No  facility-administered medications prior to visit.     Allergies:   Other; Tetanus toxoid, adsorbed; and Tetanus toxoids   Past Medical History:  Diagnosis Date  . Bradycardia    relative, precluding up titration of his beta-blocker  . CAD (coronary artery disease)   . HLD (hyperlipidemia)   . Non-ST elevated myocardial infarction (non-STEMI) ([place])    2/07: LHC with occluded OM, LAD 20% + 40%; normal LVF => PTCA of OM with wire passage alone   . PVC's (premature ventricular contractions)   . Syncope    hx    Past Surgical History:  Procedure Laterality Date  . CARDIAC CATHETERIZATION    . coronary arteriogram    . left ventriculogram    . reperfusion of obtuse marginal with a wife alone       Social History:  The patient  reports that he has never smoked. He has quit using smokeless tobacco.  His smokeless tobacco use included chew. He reports that he does not drink alcohol or use drugs.   Family History:  The patient's family history includes Heart attack in his father and mother.    ROS:  Please see the history of present illness. All other systems are reviewed and  Negative to the above problem except as noted.    PHYSICAL EXAM: VS:  BP 110/66   Pulse 78   Ht 5\' 7"  (1.702 m)   Wt 172 lb 6.4 oz (78.2 kg)   SpO2 97%   BMI 27.00 kg/m   GEN: Well nourished, well developed, in no acute distress  HEENT: NCAT   Neck: JVP normal  , carotid bruits, or masses Cardiac: RRR;  Gr II/VI systolic murmur LSB / base  No  rubs, or gallops,no edema  Respiratory:  clear to auscultation bilaterally, normal work of breathing GI: soft, nontender, nondistended, + BS  No hepatomegaly  MS: s/p L aka Skin: warm and dry, no rash Neuro:  Strength and sensation are intact Psych: euthymic mood, full affect   EKG:  EKG is not  ordered today.     Lipid Panel    Component Value Date/Time   CHOL 149 10/27/2018 0819   CHOL 128 05/24/2017 0809   TRIG 83 10/27/2018 0819   HDL 54  10/27/2018 0819   HDL 62 05/24/2017 0809   CHOLHDL 2.8 10/27/2018 0819   VLDL 17 10/27/2018 0819   LDLCALC 78 10/27/2018 0819   LDLCALC 52 05/24/2017 0809      Wt Readings from Last 3 Encounters:  09/28/19 172 lb 6.4 oz (78.2 kg)  09/21/19 170 lb 8 oz (77.3 kg)  11/11/18 171 lb (77.6 kg)      ASSESSMENT AND PLAN:  1.  CHest / back pain.   I am not convinced it was angina   Myovue was negative for ischemia     I have recomm he increase his activity and follow   Keep NTG near by    Use my chart or call in about 1 week with how feeling    If continues or worsens then may indeed need to cath   2  CAD Remote cath   Myovue without ischemia   Follow   3  HLWIll need to check lipids     4  AV dz  Echo showed mild to mod AS   FOllow   I am not convinced it is leading to symptoms WIll also check TSH         F/u in 1 year      Current medicines are reviewed at length with the patient today.  The patient does not have concerns regarding medicines.  Signed, 11/13/18, MD  09/28/2019 4:12 PM    [facility] [address] [place], [place], [place]  [place] Phone: [phone]; Fax: [phone]

## 2019-09-29 LAB — LIPID PANEL
Chol/HDL Ratio: 2.6 ratio (ref 0.0–5.0)
Cholesterol, Total: 174 mg/dL (ref 100–199)
HDL: 66 mg/dL (ref >39)
LDL Chol Calc (NIH): 92 mg/dL (ref 0–99)
Triglycerides: 90 mg/dL (ref 0–149)
VLDL Cholesterol Cal: 16 mg/dL (ref 5–40)

## 2019-09-29 LAB — PSA: Prostate Specific Ag, Serum: 8.9 ng/mL — ABNORMAL HIGH (ref 0.0–4.0)

## 2019-09-29 LAB — TSH: TSH: 1.22 u[IU]/mL (ref 0.450–4.500)

## 2019-09-30 ENCOUNTER — Other Ambulatory Visit: Payer: Self-pay | Admitting: *Deleted

## 2019-09-30 ENCOUNTER — Telehealth: Payer: Self-pay | Admitting: *Deleted

## 2019-09-30 DIAGNOSIS — E782 Mixed hyperlipidemia: Secondary | ICD-10-CM

## 2019-09-30 MED ORDER — ROSUVASTATIN CALCIUM 20 MG PO TABS
20.0000 mg | ORAL_TABLET | Freq: Every day | ORAL | 6 refills | Status: DC
Start: 2019-09-30 — End: 2020-03-22

## 2019-09-30 NOTE — Telephone Encounter (Signed)
-----   Message from Pricilla Riffle, MD sent at 09/30/2019  3:16 PM EDT ----- Spoke to pt Thyroid function normal Cholesterol:  LDL 92  SHould be lower given CAD   REcomm:  Stop simvistatin  Start Crestor 20 mg   F/U lipids and liver panel in 8 wks  PSA high at 8.9  This is similar to 11 months ago.  Pt was seen by [facility] 1 year ago   Please forward this to that office

## 2019-09-30 NOTE — Telephone Encounter (Signed)
I spoke with [name] [name]. His pharmacy is [facility] on [address] in [place]. Will send in prescription for Crestor 20 mg daily. Patient aware to stop Simvastatin. He will come to office for fasting lab work on July 8,2021.  PSA results routed to Dr Richardo Hanks.

## 2019-10-17 ENCOUNTER — Inpatient Hospital Stay: Payer: Medicare Other | Admitting: Anesthesiology

## 2019-10-17 ENCOUNTER — Other Ambulatory Visit: Payer: Self-pay

## 2019-10-17 ENCOUNTER — Encounter
Admission: EM | Disposition: A | Discharge status: Home Health | Payer: Self-pay | Source: Home / Self Care | Attending: Surgery

## 2019-10-17 ENCOUNTER — Emergency Department: Payer: Medicare Other

## 2019-10-17 ENCOUNTER — Inpatient Hospital Stay
Admission: EM | Admit: 2019-10-17 | Discharge: 2019-10-23 | DRG: 329 | Disposition: A | Discharge status: Home Health | Payer: Medicare Other | Attending: Surgery | Admitting: Surgery

## 2019-10-17 DIAGNOSIS — I350 Nonrheumatic aortic (valve) stenosis: Secondary | ICD-10-CM | POA: Diagnosis present

## 2019-10-17 DIAGNOSIS — K55021 Focal (segmental) acute infarction of small intestine: Secondary | ICD-10-CM | POA: Diagnosis present

## 2019-10-17 DIAGNOSIS — R110 Nausea: Secondary | ICD-10-CM

## 2019-10-17 DIAGNOSIS — I480 Paroxysmal atrial fibrillation: Secondary | ICD-10-CM

## 2019-10-17 DIAGNOSIS — I10 Essential (primary) hypertension: Secondary | ICD-10-CM | POA: Diagnosis present

## 2019-10-17 DIAGNOSIS — I252 Old myocardial infarction: Secondary | ICD-10-CM

## 2019-10-17 DIAGNOSIS — Z8711 Personal history of peptic ulcer disease: Secondary | ICD-10-CM | POA: Diagnosis not present

## 2019-10-17 DIAGNOSIS — Z9889 Other specified postprocedural states: Secondary | ICD-10-CM

## 2019-10-17 DIAGNOSIS — R9389 Abnormal findings on diagnostic imaging of other specified body structures: Secondary | ICD-10-CM

## 2019-10-17 DIAGNOSIS — I2510 Atherosclerotic heart disease of native coronary artery without angina pectoris: Secondary | ICD-10-CM | POA: Diagnosis present

## 2019-10-17 DIAGNOSIS — Z7982 Long term (current) use of aspirin: Secondary | ICD-10-CM | POA: Diagnosis not present

## 2019-10-17 DIAGNOSIS — D649 Anemia, unspecified: Secondary | ICD-10-CM | POA: Diagnosis present

## 2019-10-17 DIAGNOSIS — Z79899 Other long term (current) drug therapy: Secondary | ICD-10-CM | POA: Diagnosis not present

## 2019-10-17 DIAGNOSIS — R1030 Lower abdominal pain, unspecified: Secondary | ICD-10-CM

## 2019-10-17 DIAGNOSIS — Z87891 Personal history of nicotine dependence: Secondary | ICD-10-CM | POA: Diagnosis not present

## 2019-10-17 DIAGNOSIS — I4892 Unspecified atrial flutter: Secondary | ICD-10-CM | POA: Diagnosis not present

## 2019-10-17 DIAGNOSIS — E785 Hyperlipidemia, unspecified: Secondary | ICD-10-CM | POA: Diagnosis present

## 2019-10-17 DIAGNOSIS — I959 Hypotension, unspecified: Secondary | ICD-10-CM | POA: Diagnosis not present

## 2019-10-17 DIAGNOSIS — I4891 Unspecified atrial fibrillation: Secondary | ICD-10-CM | POA: Diagnosis not present

## 2019-10-17 DIAGNOSIS — Z20822 Contact with and (suspected) exposure to covid-19: Secondary | ICD-10-CM | POA: Diagnosis present

## 2019-10-17 DIAGNOSIS — I499 Cardiac arrhythmia, unspecified: Secondary | ICD-10-CM

## 2019-10-17 DIAGNOSIS — K461 Unspecified abdominal hernia with gangrene: Secondary | ICD-10-CM | POA: Diagnosis present

## 2019-10-17 DIAGNOSIS — R109 Unspecified abdominal pain: Secondary | ICD-10-CM | POA: Diagnosis present

## 2019-10-17 HISTORY — PX: APPLICATION OF WOUND VAC: SHX5189

## 2019-10-17 HISTORY — PX: LAPAROTOMY: SHX154

## 2019-10-17 HISTORY — PX: BOWEL RESECTION: SHX1257

## 2019-10-17 LAB — CBC WITH DIFFERENTIAL/PLATELET
Abs Immature Granulocytes: 0.04 10*3/uL (ref 0.00–0.07)
Abs Immature Granulocytes: 0.08 10*3/uL — ABNORMAL HIGH (ref 0.00–0.07)
Basophils Absolute: 0 10*3/uL (ref 0.0–0.1)
Basophils Absolute: 0 10*3/uL (ref 0.0–0.1)
Basophils Relative: 0 %
Basophils Relative: 0 %
Eosinophils Absolute: 0 10*3/uL (ref 0.0–0.5)
Eosinophils Absolute: 0 10*3/uL (ref 0.0–0.5)
Eosinophils Relative: 0 %
Eosinophils Relative: 0 %
HCT: 41.1 % (ref 39.0–52.0)
HCT: 37.7 % — ABNORMAL LOW (ref 39.0–52.0)
Hemoglobin: 13.9 g/dL (ref 13.0–17.0)
Hemoglobin: 13 g/dL (ref 13.0–17.0)
Immature Granulocytes: 0 %
Immature Granulocytes: 0 %
Lymphocytes Relative: 14 %
Lymphocytes Relative: 3 %
Lymphs Abs: 1.7 10*3/uL (ref 0.7–4.0)
Lymphs Abs: 0.6 10*3/uL — ABNORMAL LOW (ref 0.7–4.0)
MCH: 33.6 pg (ref 26.0–34.0)
MCH: 34.6 pg — ABNORMAL HIGH (ref 26.0–34.0)
MCHC: 33.8 g/dL (ref 30.0–36.0)
MCHC: 34.5 g/dL (ref 30.0–36.0)
MCV: 99.3 fL (ref 80.0–100.0)
MCV: 100.3 fL — ABNORMAL HIGH (ref 80.0–100.0)
Monocytes Absolute: 0.9 10*3/uL (ref 0.1–1.0)
Monocytes Absolute: 1.1 10*3/uL — ABNORMAL HIGH (ref 0.1–1.0)
Monocytes Relative: 8 %
Monocytes Relative: 6 %
Neutro Abs: 9.4 10*3/uL — ABNORMAL HIGH (ref 1.7–7.7)
Neutro Abs: 16.8 10*3/uL — ABNORMAL HIGH (ref 1.7–7.7)
Neutrophils Relative %: 78 %
Neutrophils Relative %: 91 %
Platelets: 193 10*3/uL (ref 150–400)
Platelets: 162 10*3/uL (ref 150–400)
RBC: 4.14 MIL/uL — ABNORMAL LOW (ref 4.22–5.81)
RBC: 3.76 MIL/uL — ABNORMAL LOW (ref 4.22–5.81)
RDW: 11.8 % (ref 11.5–15.5)
RDW: 12 % (ref 11.5–15.5)
WBC: 12.1 10*3/uL — ABNORMAL HIGH (ref 4.0–10.5)
WBC: 18.6 10*3/uL — ABNORMAL HIGH (ref 4.0–10.5)
nRBC: 0 % (ref 0.0–0.2)
nRBC: 0 % (ref 0.0–0.2)

## 2019-10-17 LAB — MAGNESIUM: Magnesium: 1.7 mg/dL (ref 1.7–2.4)

## 2019-10-17 LAB — TROPONIN I (HIGH SENSITIVITY)
Troponin I (High Sensitivity): 9 ng/L (ref <18)
Troponin I (High Sensitivity): 10 ng/L (ref <18)

## 2019-10-17 LAB — BASIC METABOLIC PANEL
Anion gap: 6 (ref 5–15)
BUN: 21 mg/dL (ref 8–23)
CO2: 26 mmol/L (ref 22–32)
Calcium: 8.1 mg/dL — ABNORMAL LOW (ref 8.9–10.3)
Chloride: 108 mmol/L (ref 98–111)
Creatinine, Ser: 0.77 mg/dL (ref 0.61–1.24)
GFR calc Af Amer: >60 mL/min (ref >60)
GFR calc non Af Amer: >60 mL/min (ref >60)
Glucose, Bld: 175 mg/dL — ABNORMAL HIGH (ref 70–99)
Potassium: 4.5 mmol/L (ref 3.5–5.1)
Sodium: 140 mmol/L (ref 135–145)

## 2019-10-17 LAB — COMPREHENSIVE METABOLIC PANEL
ALT: 29 U/L (ref 0–44)
AST: 29 U/L (ref 15–41)
Albumin: 4.1 g/dL (ref 3.5–5.0)
Alkaline Phosphatase: 62 U/L (ref 38–126)
Anion gap: 13 (ref 5–15)
BUN: 15 mg/dL (ref 8–23)
CO2: 26 mmol/L (ref 22–32)
Calcium: 9.6 mg/dL (ref 8.9–10.3)
Chloride: 101 mmol/L (ref 98–111)
Creatinine, Ser: 0.84 mg/dL (ref 0.61–1.24)
GFR calc Af Amer: >60 mL/min (ref >60)
GFR calc non Af Amer: >60 mL/min (ref >60)
Glucose, Bld: 175 mg/dL — ABNORMAL HIGH (ref 70–99)
Potassium: 4 mmol/L (ref 3.5–5.1)
Sodium: 140 mmol/L (ref 135–145)
Total Bilirubin: 1.3 mg/dL — ABNORMAL HIGH (ref 0.3–1.2)
Total Protein: 7.2 g/dL (ref 6.5–8.1)

## 2019-10-17 LAB — SARS CORONAVIRUS 2 BY RT PCR (HOSPITAL ORDER, PERFORMED IN ~~LOC~~ HOSPITAL LAB): SARS Coronavirus 2: NEGATIVE

## 2019-10-17 LAB — GLUCOSE, CAPILLARY: Glucose-Capillary: 184 mg/dL — ABNORMAL HIGH (ref 70–99)

## 2019-10-17 LAB — MRSA PCR SCREENING: MRSA by PCR: NEGATIVE

## 2019-10-17 LAB — LIPASE, BLOOD: Lipase: 17 U/L (ref 11–51)

## 2019-10-17 LAB — PHOSPHORUS: Phosphorus: 2.8 mg/dL (ref 2.5–4.6)

## 2019-10-17 SURGERY — LAPAROTOMY, EXPLORATORY
Anesthesia: General | Site: Abdomen

## 2019-10-17 MED ORDER — VASOPRESSIN 20 UNIT/ML IV SOLN
INTRAVENOUS | Status: DC | PRN
  Administered 2019-10-17 (×6): 1 [IU] via INTRAVENOUS

## 2019-10-17 MED ORDER — BUPIVACAINE-EPINEPHRINE (PF) 0.25% -1:200000 IJ SOLN
INTRAMUSCULAR | Status: DC | PRN
  Administered 2019-10-17: 30 mL via PERINEURAL

## 2019-10-17 MED ORDER — IOHEXOL 12 MG/ML PO SOLN: 500.0000 mL | ORAL | Status: AC

## 2019-10-17 MED ORDER — SODIUM CHLORIDE 0.9 % IV SOLN
INTRAVENOUS | Status: DC | PRN
  Administered 2019-10-17: 30 mL

## 2019-10-17 MED ORDER — MIDAZOLAM HCL 2 MG/2ML IJ SOLN
INTRAMUSCULAR | Status: AC
  Filled 2019-10-17: qty 2

## 2019-10-17 MED ORDER — METOPROLOL TARTRATE 5 MG/5ML IV SOLN
5.0000 mg | Freq: Four times a day (QID) | INTRAVENOUS | Status: DC
  Administered 2019-10-18 – 2019-10-20 (×5): 5 mg via INTRAVENOUS
  Filled 2019-10-17 (×7): qty 5

## 2019-10-17 MED ORDER — PIPERACILLIN-TAZOBACTAM 3.375 G IVPB
3.3750 g | Freq: Three times a day (TID) | INTRAVENOUS | Status: DC
  Administered 2019-10-17 – 2019-10-23 (×16): 3.375 g via INTRAVENOUS
  Filled 2019-10-17 (×21): qty 50

## 2019-10-17 MED ORDER — DEXAMETHASONE SODIUM PHOSPHATE 10 MG/ML IJ SOLN
INTRAMUSCULAR | Status: DC | PRN
  Administered 2019-10-17: 10 mg via INTRAVENOUS

## 2019-10-17 MED ORDER — PROPOFOL 10 MG/ML IV BOLUS
INTRAVENOUS | Status: DC | PRN
  Administered 2019-10-17: 120 mg via INTRAVENOUS

## 2019-10-17 MED ORDER — BUPIVACAINE LIPOSOME 1.3 % IJ SUSP
INTRAMUSCULAR | Status: AC
  Filled 2019-10-17: qty 20

## 2019-10-17 MED ORDER — PIPERACILLIN-TAZOBACTAM 3.375 G IVPB
INTRAVENOUS | Status: AC
  Administered 2019-10-18: 3.375 g via INTRAVENOUS
  Filled 2019-10-17: qty 50

## 2019-10-17 MED ORDER — NOREPINEPHRINE 4 MG/250ML-% IV SOLN: 0.0000 ug/min | INTRAVENOUS | Status: DC

## 2019-10-17 MED ORDER — MORPHINE SULFATE (PF) 2 MG/ML IV SOLN
2.0000 mg | Freq: Once | INTRAVENOUS | Status: AC
  Administered 2019-10-17: 2 mg via INTRAVENOUS
  Filled 2019-10-17: qty 1

## 2019-10-17 MED ORDER — PANTOPRAZOLE SODIUM 40 MG IV SOLR
40.0000 mg | Freq: Every day | INTRAVENOUS | Status: DC
  Administered 2019-10-17 – 2019-10-22 (×6): 40 mg via INTRAVENOUS
  Filled 2019-10-17 (×6): qty 40

## 2019-10-17 MED ORDER — HYDROMORPHONE HCL 1 MG/ML IJ SOLN
0.5000 mg | INTRAMUSCULAR | Status: DC | PRN
  Administered 2019-10-22: 0.5 mg via INTRAVENOUS
  Filled 2019-10-17: qty 1

## 2019-10-17 MED ORDER — FENTANYL CITRATE (PF) 250 MCG/5ML IJ SOLN
INTRAMUSCULAR | Status: AC
  Filled 2019-10-17: qty 5

## 2019-10-17 MED ORDER — ROCURONIUM BROMIDE 100 MG/10ML IV SOLN
INTRAVENOUS | Status: DC | PRN
  Administered 2019-10-17: 50 mg via INTRAVENOUS
  Administered 2019-10-17: 30 mg via INTRAVENOUS

## 2019-10-17 MED ORDER — SODIUM CHLORIDE 0.9 % IV BOLUS
1000.0000 mL | Freq: Once | INTRAVENOUS | Status: AC
  Administered 2019-10-17: 1000 mL via INTRAVENOUS

## 2019-10-17 MED ORDER — PROPOFOL 10 MG/ML IV BOLUS
INTRAVENOUS | Status: AC
  Filled 2019-10-17: qty 40

## 2019-10-17 MED ORDER — KETOROLAC TROMETHAMINE 30 MG/ML IJ SOLN
15.0000 mg | Freq: Four times a day (QID) | INTRAMUSCULAR | Status: DC
  Administered 2019-10-17: 15 mg via INTRAVENOUS
  Filled 2019-10-17: qty 1

## 2019-10-17 MED ORDER — ONDANSETRON HCL 4 MG/2ML IJ SOLN
4.0000 mg | Freq: Four times a day (QID) | INTRAMUSCULAR | Status: DC | PRN
  Administered 2019-10-17: 4 mg via INTRAVENOUS

## 2019-10-17 MED ORDER — ONDANSETRON HCL 4 MG/2ML IJ SOLN
4.0000 mg | Freq: Once | INTRAMUSCULAR | Status: AC
  Administered 2019-10-17: 4 mg via INTRAVENOUS
  Filled 2019-10-17: qty 2

## 2019-10-17 MED ORDER — BUPIVACAINE-EPINEPHRINE (PF) 0.5% -1:200000 IJ SOLN
INTRAMUSCULAR | Status: AC
  Filled 2019-10-17: qty 30

## 2019-10-17 MED ORDER — LACTATED RINGERS IV SOLN: INTRAVENOUS | Status: DC

## 2019-10-17 MED ORDER — POLYETHYLENE GLYCOL 3350 17 G PO PACK: 17.0000 g | PACK | Freq: Every day | ORAL | Status: DC | PRN

## 2019-10-17 MED ORDER — CHLORHEXIDINE GLUCONATE CLOTH 2 % EX PADS
6.0000 | MEDICATED_PAD | Freq: Every day | CUTANEOUS | Status: DC
  Administered 2019-10-17 – 2019-10-19 (×3): 6 via TOPICAL

## 2019-10-17 MED ORDER — SUCCINYLCHOLINE CHLORIDE 20 MG/ML IJ SOLN
INTRAMUSCULAR | Status: DC | PRN
  Administered 2019-10-17: 120 mg via INTRAVENOUS

## 2019-10-17 MED ORDER — LIDOCAINE HCL (CARDIAC) PF 100 MG/5ML IV SOSY
PREFILLED_SYRINGE | INTRAVENOUS | Status: DC | PRN
  Administered 2019-10-17: 100 mg via INTRAVENOUS

## 2019-10-17 MED ORDER — ONDANSETRON 4 MG PO TBDP
4.0000 mg | ORAL_TABLET | Freq: Four times a day (QID) | ORAL | Status: DC | PRN
  Filled 2019-10-17: qty 1

## 2019-10-17 MED ORDER — IOHEXOL 300 MG/ML  SOLN
100.0000 mL | Freq: Once | INTRAMUSCULAR | Status: AC | PRN
  Administered 2019-10-17: 100 mL via INTRAVENOUS

## 2019-10-17 MED ORDER — SODIUM CHLORIDE 0.9 % IV SOLN: 250.0000 mL | INTRAVENOUS | Status: DC

## 2019-10-17 MED ORDER — ORAL CARE MOUTH RINSE
15.0000 mL | OROMUCOSAL | Status: DC
  Administered 2019-10-17 – 2019-10-19 (×14): 15 mL via OROMUCOSAL

## 2019-10-17 MED ORDER — MIDAZOLAM HCL 2 MG/2ML IJ SOLN
2.0000 mg | INTRAMUSCULAR | Status: DC | PRN
  Administered 2019-10-17 – 2019-10-18 (×4): 2 mg via INTRAVENOUS
  Filled 2019-10-17 (×4): qty 2

## 2019-10-17 MED ORDER — CHLORHEXIDINE GLUCONATE 0.12% ORAL RINSE (MEDLINE KIT)
15.0000 mL | Freq: Two times a day (BID) | OROMUCOSAL | Status: DC
  Administered 2019-10-17 – 2019-10-19 (×4): 15 mL via OROMUCOSAL

## 2019-10-17 MED ORDER — MIDAZOLAM HCL 2 MG/2ML IJ SOLN
INTRAMUSCULAR | Status: DC | PRN
  Administered 2019-10-17: 2 mg via INTRAVENOUS

## 2019-10-17 MED ORDER — FENTANYL 2500MCG IN NS 250ML (10MCG/ML) PREMIX INFUSION
0.0000 ug/h | INTRAVENOUS | Status: DC
  Administered 2019-10-17: 200 ug/h via INTRAVENOUS
  Administered 2019-10-17: 350 ug/h via INTRAVENOUS
  Administered 2019-10-18: 400 ug/h via INTRAVENOUS
  Filled 2019-10-17 (×3): qty 250

## 2019-10-17 MED ORDER — FENTANYL CITRATE (PF) 100 MCG/2ML IJ SOLN
50.0000 ug | Freq: Once | INTRAMUSCULAR | Status: AC
  Administered 2019-10-17: 50 ug via INTRAVENOUS
  Filled 2019-10-17: qty 2

## 2019-10-17 MED ORDER — PIPERACILLIN-TAZOBACTAM 3.375 G IVPB
3.3750 g | INTRAVENOUS | Status: AC
  Administered 2019-10-17: 3.375 g via INTRAVENOUS

## 2019-10-17 MED ORDER — NOREPINEPHRINE 4 MG/250ML-% IV SOLN
2.0000 ug/min | INTRAVENOUS | Status: DC
  Administered 2019-10-17: 3 ug/min via INTRAVENOUS
  Filled 2019-10-17 (×2): qty 250

## 2019-10-17 MED ORDER — METOCLOPRAMIDE HCL 5 MG/ML IJ SOLN
5.0000 mg | Freq: Once | INTRAMUSCULAR | Status: AC
  Administered 2019-10-17: 5 mg via INTRAVENOUS
  Filled 2019-10-17: qty 2

## 2019-10-17 MED ORDER — PHENYLEPHRINE HCL (PRESSORS) 10 MG/ML IV SOLN
INTRAVENOUS | Status: DC | PRN
  Administered 2019-10-17 (×2): 200 ug via INTRAVENOUS
  Administered 2019-10-17: 100 ug via INTRAVENOUS
  Administered 2019-10-17: 200 ug via INTRAVENOUS
  Administered 2019-10-17: 100 ug via INTRAVENOUS
  Administered 2019-10-17 (×2): 200 ug via INTRAVENOUS
  Administered 2019-10-17: 100 ug via INTRAVENOUS

## 2019-10-17 MED ORDER — ENOXAPARIN SODIUM 40 MG/0.4ML ~~LOC~~ SOLN
40.0000 mg | SUBCUTANEOUS | Status: DC
  Administered 2019-10-17 – 2019-10-19 (×3): 40 mg via SUBCUTANEOUS
  Filled 2019-10-17 (×3): qty 0.4

## 2019-10-17 MED ORDER — FENTANYL CITRATE (PF) 100 MCG/2ML IJ SOLN
INTRAMUSCULAR | Status: DC | PRN
  Administered 2019-10-17 (×5): 50 ug via INTRAVENOUS

## 2019-10-17 SURGICAL SUPPLY — 42 items
APL PRP STRL LF ISPRP CHG 10.5 (MISCELLANEOUS) ×1
APPLICATOR CHLORAPREP 10.5 ORG (MISCELLANEOUS) ×2 IMPLANT
BRR ADH 6X5 SEPRAFILM 1 SHT (MISCELLANEOUS) ×1
CANISTER SUCT 1200ML W/VALVE (MISCELLANEOUS) ×2 IMPLANT
CANISTER WOUND CARE 500ML ATS (WOUND CARE) ×1 IMPLANT
COVER WAND RF STERILE (DRAPES) ×2 IMPLANT
DRAPE LAPAROTOMY 100X77 ABD (DRAPES) ×2 IMPLANT
DRSG OPSITE POSTOP 4X12 (GAUZE/BANDAGES/DRESSINGS) ×2 IMPLANT
DRSG TEGADERM 4X10 (GAUZE/BANDAGES/DRESSINGS) ×2 IMPLANT
ELECT CAUTERY BLADE 6.4 (BLADE) ×2 IMPLANT
ELECT REM PT RETURN 9FT ADLT (ELECTROSURGICAL) ×2
ELECTRODE REM PT RTRN 9FT ADLT (ELECTROSURGICAL) ×1 IMPLANT
GAUZE SPONGE 4X4 12PLY STRL (GAUZE/BANDAGES/DRESSINGS) ×2 IMPLANT
GLOVE SURG SYN 7.0 (GLOVE) ×4 IMPLANT
GLOVE SURG SYN 7.0 PF PI (GLOVE) ×2 IMPLANT
GLOVE SURG SYN 7.5  E (GLOVE) ×4
GLOVE SURG SYN 7.5 E (GLOVE) ×2 IMPLANT
GLOVE SURG SYN 7.5 PF PI (GLOVE) ×2 IMPLANT
GOWN STRL REUS W/ TWL LRG LVL3 (GOWN DISPOSABLE) ×4 IMPLANT
GOWN STRL REUS W/TWL LRG LVL3 (GOWN DISPOSABLE) ×8
LABEL OR SOLS (LABEL) ×2 IMPLANT
LIGASURE IMPACT 36 18CM CVD LR (INSTRUMENTS) ×3 IMPLANT
NEEDLE HYPO 22GX1.5 SAFETY (NEEDLE) ×2 IMPLANT
NS IRRIG 1000ML POUR BTL (IV SOLUTION) ×4 IMPLANT
PACK BASIN MAJOR (MISCELLANEOUS) ×2 IMPLANT
PACK COLON CLEAN CLOSURE (MISCELLANEOUS) ×2 IMPLANT
RELOAD PROXIMATE 75MM BLUE (ENDOMECHANICALS) ×4 IMPLANT
RELOAD STAPLE 75 3.8 BLU REG (ENDOMECHANICALS) IMPLANT
SEPRAFILM MEMBRANE 5X6 (MISCELLANEOUS) ×2 IMPLANT
SPONGE ABDOMINAL VAC ABTHERA (MISCELLANEOUS) ×1 IMPLANT
STAPLER PROXIMATE 75MM BLUE (STAPLE) ×1 IMPLANT
STAPLER SKIN PROX 35W (STAPLE) ×2 IMPLANT
SUT PDS AB 1 CT1 36 (SUTURE) ×2 IMPLANT
SUT PROLENE 0 CT 1 30 (SUTURE) ×6 IMPLANT
SUT SILK 2 0 (SUTURE) ×2
SUT SILK 2-0 18XBRD TIE 12 (SUTURE) ×1 IMPLANT
SUT SILK 3-0 (SUTURE) ×2 IMPLANT
SUT VIC AB 3-0 SH 27 (SUTURE) ×2
SUT VIC AB 3-0 SH 27X BRD (SUTURE) ×1 IMPLANT
SYR 10ML LL (SYRINGE) ×3 IMPLANT
SYR BULB IRRIG 60ML STRL (SYRINGE) ×1 IMPLANT
TRAY FOLEY MTR SLVR 16FR STAT (SET/KITS/TRAYS/PACK) ×2 IMPLANT

## 2019-10-17 NOTE — ED Provider Notes (Signed)
Consulted Dr. Aleen Campi of surgery based on concern for bowel obstruction on CT, he will admit the patient   Jene Every, MD 10/17/19 1121

## 2019-10-17 NOTE — Op Note (Signed)
  Procedure Date:  10/17/2019  Pre-operative Diagnosis:   Internal hernia  Post-operative Diagnosis:  Internal hernia with gangrenous small bowel  Procedure:  Exploratory Laparotomy, small bowel resection of 70 cm, negative pressure wound vac placement > 50 cm.  Surgeon:  Howie Ill, MD  Anesthesia:  General endotracheal  Estimated Blood Loss:  30 ml  Specimens:  Small bowel   Complications:  None  Findings:  Patient had an internal hernia due to adhered omentum.  There was about 130 cm of compromised small bowel length, with 60 cm of gangrenous small bowel.  The gangrenous small bowel was resected and the abdomen was left open with an ABthera wound vac for second look in 24 hours.  Indications for Procedure:  This is a 86 y.o. male who presents with abdominal pain and workup concerning for an internal hernia.  The risks of bleeding, abscess or infection, injury to surrounding structures, and need for further procedures were all discussed with the patient and was willing to proceed.  Description of Procedure: The patient was correctly identified in the preoperative area and brought into the operating room.  The patient was placed supine with VTE prophylaxis in place.  Appropriate time-outs were performed.  Anesthesia was induced and the patient was intubated.  Foley catheter was placed.  Appropriate antibiotics were infused.  The abdomen was prepped and draped in a sterile fashion.  A midline incision was made and electrocautery was used to dissect down the subcutaneous tissue to the fascia.  The fascia was incised and extended superiorly and inferiorly.  The abdomen was explored revealing an internal hernia over the right abdominal wall, consisting of adhered omentum, which was creating an internal hernia with bowel compromised.  Cautery was used to free up the adhesion, and this revealed a long segment of ischemic and gangrenous small bowel, measuring about 130 cm.  Of that, about  60-70 cm was gangrenous.  It was decided to leave the patient in discontinuity to see if the ischemic portion would recover.   The window in the mesentery was created in the proximal margin, and a 75 mm blue load GIA stapler was fired across.  The same was done at the distal margin.  Then, the mesentery was taken down using LigaSure.  Doppler was used and there was flow to the ischemic bowel.   The abdomen was thoroughly irrigated.  An ABthera wound vac was placed with appropriate coverage of the bowel and abdominal cavity, and connected to suction with good seal.   The patient was then transported, still intubated and sedated, to the ICU for further management.  The patient tolerated the procedure well and all counts were correct at the end of the case.   Howie Ill, MD

## 2019-10-17 NOTE — ED Notes (Signed)
Pt took 2 sips of contrast and then reports "no more"

## 2019-10-17 NOTE — ED Notes (Signed)
O2 added 2 lpm [place]

## 2019-10-17 NOTE — Anesthesia Preprocedure Evaluation (Addendum)
Anesthesia Evaluation  Patient identified by MRN, date of birth, ID band Patient awake    Reviewed: Allergy & Precautions, NPO status , Patient's Chart, lab work & pertinent test results  History of Anesthesia Complications Negative for: history of anesthetic complications  Airway Mallampati: II       Dental   Pulmonary neg sleep apnea, neg COPD, Not current smoker,           Cardiovascular + CAD, + Past MI and +CHF (LVEF 50%)  (-) Valvular Problems/Murmurs     Neuro/Psych neg Seizures    GI/Hepatic Neg liver ROS, neg GERD  ,N/V abdominal pain, SBO   Endo/Other  neg diabetes  Renal/GU negative Renal ROS     Musculoskeletal   Abdominal   Peds  Hematology   Anesthesia Other Findings   Reproductive/Obstetrics                            Anesthesia Physical Anesthesia Plan  ASA: III and emergent  Anesthesia Plan: General   Post-op Pain Management:    Induction: Intravenous  PONV Risk Score and Plan: 2 and Ondansetron and Dexamethasone  Airway Management Planned: Oral ETT  Additional Equipment:   Intra-op Plan:   Post-operative Plan: Possible Post-op intubation/ventilation  Informed Consent: I have reviewed the patients History and Physical, chart, labs and discussed the procedure including the risks, benefits and alternatives for the proposed anesthesia with the patient or authorized representative who has indicated his/her understanding and acceptance.       Plan Discussed with:   Anesthesia Plan Comments:         Anesthesia Quick Evaluation

## 2019-10-17 NOTE — ED Notes (Addendum)
Call bell answered and pt reports pain as bad as initially reported; pt intermittent cough and retching but no emesis  Pt c/o burning and hurting in lower left and right abdomen

## 2019-10-17 NOTE — H&P (Signed)
Date of Admission:  10/17/2019  Reason for Admission: Abdominal pain, possible internal hernia  History of Present Illness: [name] [name] is a 86 y.o. male presenting with a 2-day history of abdominal pain associated with nausea and dry heaving.  Patient reports that yesterday he started having some mild discomfort in the mid abdomen and was having some mild nausea as well.  The day before he was feeling perfectly fine.  Then last night he woke up around 1 in the morning with acute onset of worsening abdominal pain associated with nausea and dry heaving.  He denies any fevers but reports having some chills and sweats.  Denies any chest pain or shortness of breath.  He did have a bowel movement yesterday as well as flatus but nothing so far today overnight.  He denies eating anything out of the ordinary and denies any sick contacts.  He presents to the emergency room earlier this morning and part of his work-up included laboratory analysis which showed a WBC of 12.1, hemoglobin 13.9, troponin of 9, creatinine 0.84.  He also had a CT scan which I have independently viewed.  This shows mildly dilated thick-walled small bowel loops in the right abdomen associated with mesenteric edema and stranding.  Although there is no pneumatosis or abscess the concern from this is for an internal hernia.  Past Medical History: Past Medical History:  Diagnosis Date  . Bradycardia    relative, precluding up titration of his beta-blocker  . CAD (coronary artery disease)   . HLD (hyperlipidemia)   . Non-ST elevated myocardial infarction (non-STEMI) ([facility])    2/07: LHC with occluded OM, LAD 20% + 40%; normal LVF => PTCA of OM with wire passage alone   . PVC's (premature ventricular contractions)   . Syncope    hx     Past Surgical History: Past Surgical History:  Procedure Laterality Date  . CARDIAC CATHETERIZATION    . coronary arteriogram    . EXPLORATORY LAPAROTOMY  1970s   for bleeding peptic ulcer  .  left ventriculogram    . reperfusion of obtuse marginal with a wife alone      Home Medications: Prior to Admission medications   Medication Sig Start Date End Date Taking? Authorizing Provider  aspirin EC 81 MG tablet Take 1 tablet (81 mg total) by mouth daily. 03/21/17   Pricilla Riffle, MD  BEE POLLEN PO Take 1 capsule by mouth daily.    [provider]  metoprolol succinate (TOPROL-XL) 25 MG 24 hr tablet Take 1 tablet (25 mg total) by mouth daily. Please make yearly appt with Dr. Tenny Craw for June for future refills. 1st attempt Patient taking differently: Take 25 mg by mouth at bedtime.  09/07/19   Pricilla Riffle, MD  nitroGLYCERIN (NITROSTAT) 0.4 MG SL tablet place 1 tablet under the tongue if needed every 5 minutes for chest pain for 3 doses IF NO RELIEF AFTER 3RD DOSE CALL PRESCRIBER OR 911. 10/24/18   Pricilla Riffle, MD  rosuvastatin (CRESTOR) 20 MG tablet Take 1 tablet (20 mg total) by mouth daily. 09/30/19   Pricilla Riffle, MD    Allergies: Allergies  Allergen Reactions  . Other Other (See Comments)    Tetanus shot  . Tetanus Toxoid, Adsorbed     Tetanus shot  . Tetanus Toxoids     Tetanus shot    Social History:  reports that he has never smoked. He has quit using smokeless tobacco.  His smokeless tobacco  use included chew. He reports that he does not drink alcohol or use drugs.   Family History: Family History  Problem Relation Age of Onset  . Heart attack Mother   . Heart attack Father     Review of Systems: Review of Systems  Constitutional: Positive for chills. Negative for fever.  HENT: Negative for hearing loss.   Respiratory: Negative for shortness of breath.   Cardiovascular: Negative for chest pain.  Gastrointestinal: Positive for abdominal pain and nausea. Negative for constipation, diarrhea and vomiting.  Genitourinary: Negative for dysuria.  Musculoskeletal: Negative for myalgias.  Skin: Negative for rash.  Neurological: Negative for dizziness.   Psychiatric/Behavioral: Negative for depression.    Physical Exam BP 120/62   Pulse 88   Temp 97.9 F (36.6 C) (Oral)   Resp 14   Ht 5\' 7"  (1.702 m)   Wt 78 kg   SpO2 99%   BMI 26.94 kg/m  CONSTITUTIONAL: No acute distress HEENT:  Normocephalic, atraumatic, extraocular motion intact. NECK: Trachea is midline, and there is no jugular venous distension.  RESPIRATORY:  Lungs are clear, and breath sounds are equal bilaterally. Normal respiratory effort without pathologic use of accessory muscles. CARDIOVASCULAR: Heart is regular without murmurs, gallops, or rubs. GI: The abdomen is soft, nondistended, with very localized tenderness to palpation in the right side of the abdomen next to the umbilicus.  Although the patient does not appear peritoneal at this point, he does have significant tenderness in this location.  Otherwise rest of his abdomen is fairly benign.  Patient has an upper midline scar consistent with his peptic bleeding ulcer surgery.  MUSCULOSKELETAL:  Normal muscle strength and tone in all four extremities.  No peripheral edema or cyanosis. SKIN: Skin turgor is normal. There are no pathologic skin lesions.  NEUROLOGIC:  Motor and sensation is grossly normal.  Cranial nerves are grossly intact. PSYCH:  Alert and oriented to person, place and time. Affect is normal.  Laboratory Analysis: Results for orders placed or performed during the hospital encounter of 10/17/19 (from the past 24 hour(s))  CBC with Differential     Status: Abnormal   Collection Time: 10/17/19  4:42 AM  Result Value Ref Range   WBC 12.1 (H) 4.0 - 10.5 K/uL   RBC 4.14 (L) 4.22 - 5.81 MIL/uL   Hemoglobin 13.9 13.0 - 17.0 g/dL   HCT 41.1 39.0 - 52.0 %   MCV 99.3 80.0 - 100.0 fL   MCH 33.6 26.0 - 34.0 pg   MCHC 33.8 30.0 - 36.0 g/dL   RDW 11.8 11.5 - 15.5 %   Platelets 193 150 - 400 K/uL   nRBC 0.0 0.0 - 0.2 %   Neutrophils Relative % 78 %   Neutro Abs 9.4 (H) 1.7 - 7.7 K/uL   Lymphocytes Relative  14 %   Lymphs Abs 1.7 0.7 - 4.0 K/uL   Monocytes Relative 8 %   Monocytes Absolute 0.9 0.1 - 1.0 K/uL   Eosinophils Relative 0 %   Eosinophils Absolute 0.0 0.0 - 0.5 K/uL   Basophils Relative 0 %   Basophils Absolute 0.0 0.0 - 0.1 K/uL   Immature Granulocytes 0 %   Abs Immature Granulocytes 0.04 0.00 - 0.07 K/uL  Comprehensive metabolic panel     Status: Abnormal   Collection Time: 10/17/19  4:42 AM  Result Value Ref Range   Sodium 140 135 - 145 mmol/L   Potassium 4.0 3.5 - 5.1 mmol/L   Chloride 101 98 - 111  mmol/L   CO2 26 22 - 32 mmol/L   Glucose, Bld 175 (H) 70 - 99 mg/dL   BUN 15 8 - 23 mg/dL   Creatinine, Ser 8.67 0.61 - 1.24 mg/dL   Calcium 9.6 8.9 - 54.4 mg/dL   Total Protein 7.2 6.5 - 8.1 g/dL   Albumin 4.1 3.5 - 5.0 g/dL   AST 29 15 - 41 U/L   ALT 29 0 - 44 U/L   Alkaline Phosphatase 62 38 - 126 U/L   Total Bilirubin 1.3 (H) 0.3 - 1.2 mg/dL   GFR calc non Af Amer >60 >60 mL/min   GFR calc Af Amer >60 >60 mL/min   Anion gap 13 5 - 15  Lipase, blood     Status: None   Collection Time: 10/17/19  4:42 AM  Result Value Ref Range   Lipase 17 11 - 51 U/L  Troponin I (High Sensitivity)     Status: None   Collection Time: 10/17/19  4:42 AM  Result Value Ref Range   Troponin I (High Sensitivity) 9 <18 ng/L  Troponin I (High Sensitivity)     Status: None   Collection Time: 10/17/19  6:29 AM  Result Value Ref Range   Troponin I (High Sensitivity) 10 <18 ng/L  SARS Coronavirus 2 by RT PCR (hospital order, performed in [facility] hospital lab) Nasopharyngeal Nasopharyngeal Swab     Status: None   Collection Time: 10/17/19  6:29 AM   Specimen: Nasopharyngeal Swab  Result Value Ref Range   SARS Coronavirus 2 NEGATIVE NEGATIVE    Imaging: CT Abdomen Pelvis W Contrast  Result Date: 10/17/2019 CLINICAL DATA:  Left lower quadrant abdominal pain, bowel obstruction suspected EXAM: CT ABDOMEN AND PELVIS WITH CONTRAST TECHNIQUE: Multidetector CT imaging of the abdomen and pelvis  was performed using the standard protocol following bolus administration of intravenous contrast. CONTRAST:  OMNIPAQUE IOHEXOL 300 MG/ML  SOLN COMPARISON:  None. FINDINGS: Lower chest: Patchy confluent subpleural reticulation and ground-glass opacity throughout both lung bases, unchanged from recent chest CT angiogram study. No acute abnormality at the lung bases. Coronary atherosclerosis. Surgical clips surround the esophagogastric junction. Hepatobiliary: Normal liver size. No liver mass. Normal gallbladder with no radiopaque cholelithiasis. No biliary ductal dilatation. Pancreas: Diffuse fatty infiltration of the pancreas with no pancreatic mass or duct dilation. Spleen: Normal size. No mass. Adrenals/Urinary Tract: Normal adrenals. Normal kidneys with no hydronephrosis and no renal mass. Normal bladder. Stomach/Bowel: Stomach is nondistended with no acute gastric abnormality. There are a few mildly dilated thick-walled small bowel loops in right abdomen measuring up to 3.2 cm diameter with associated mesenteric edema. The proximal and distal small bowel loops are relatively collapsed. No pneumatosis. Stool present in the terminal ileum. Appendix not discretely visualized. Marked sigmoid diverticulosis with no definite large bowel wall thickening or significant pericolonic fat stranding. Vascular/Lymphatic: Atherosclerotic nonaneurysmal abdominal aorta. Patent portal, splenic, hepatic and renal veins. No pathologically enlarged lymph nodes in the abdomen or pelvis. Reproductive: Mild prostatomegaly with nonspecific internal prostatic calcification. Other: No pneumoperitoneum, ascites or focal fluid collection. Hernia repair mesh in the anterior pelvis. No evidence of a recurrent ventral hernia. Musculoskeletal: No aggressive appearing focal osseous lesions. Marked thoracolumbar spondylosis. IMPRESSION: 1. Several mildly dilated thick-walled small bowel loops in the right abdomen with associated mesenteric  edema. Proximal and distal small bowel loops are nondilated. Internal hernia with associated small bowel obstruction cannot be excluded. Differential includes regional enteritis. No free air. No pneumatosis. Surgical consultation suggested. 2.  Marked sigmoid diverticulosis with no evidence of acute diverticulitis. 3. Mild prostatomegaly. 4. Nonspecific pulmonary fibrosis at the lung bases. 5. Aortic Atherosclerosis (ICD10-I70.0). Electronically Signed   By: Delbert Phenix M.D.   On: 10/17/2019 07:43    Assessment and Plan: This is a 86 y.o. male presenting with abdominal pain, nausea, and dry heaving with localized tenderness in the right lower quadrant and a CT scan showing thickening small bowel loops with mesenteric edema concerning for potential internal hernia.  -Discussed with the patient that although this potentially could represent regional enteritis, I think is better to be on the over abundance of caution side and I think taking him to the operating room for exploratory laparotomy is a more prudent choice.  I think given how the bowels look on the CT scan, his pain is persistent despite of pain medications in the emergency room, ongoing nausea and dry heaving, I think is better to go to the operating room to take a look.  Discussed with him the role for exploratory laparotomy, the possibility for bowel resection, the risks of bleeding, infection, injury to surrounding structures.  He is willing to proceed. -Following surgery, he will be admitted to the surgical team.  He will be n.p.o. with IV fluid hydration appropriate IV pain and nausea control. --DVT prophylaxis, GI prophylaxis, will order IV antibiotics on-call to the OR.   Howie Ill, MD [facility]g:  [phone]

## 2019-10-17 NOTE — Transfer of Care (Signed)
Immediate Anesthesia Transfer of Care Note  Patient: [name] [name]  Procedure(s) Performed: EXPLORATORY LAPAROTOMY (N/A Abdomen) APPLICATION OF WOUND VAC (N/A Abdomen) SMALL BOWEL RESECTION (N/A Abdomen)  Patient Location: PACU and ICU  Anesthesia Type:General  Level of Consciousness: sedated  Airway & Oxygen Therapy: Patient remains intubated per anesthesia plan  Post-op Assessment: Report given to RN  Post vital signs: stable  Last Vitals:  Vitals Value Taken Time  BP 101/39 10/17/19 1426  Temp 36.7 C 10/17/19 1426  Pulse    Resp 14 10/17/19 1426  SpO2 100 % 10/17/19 1426    Last Pain:  Vitals:   10/17/19 1426  TempSrc: Axillary  PainSc:          Complications: No apparent anesthesia complications

## 2019-10-17 NOTE — ED Provider Notes (Signed)
[facility] Emergency Department Provider Note   ____________________________________________   First MD Initiated Contact with Patient 10/17/19 [phone]     (approximate)  I have reviewed the triage vital signs and the nursing notes.   HISTORY  Chief Complaint Abdominal Pain    HPI [name] [name] is a 86 y.o. male brought to the ED via EMS from home with a chief complaint of abdominal pain.  Patient reports lower abdominal pain which woke him up around 1 AM.  Symptoms associated with dry heaving.  Denies associated fever, chills, cough, chest pain, shortness of breath, vomiting or diarrhea.  Last BM yesterday evening which patient noted was hard stools.  Denies recent travel, trauma or antibiotic use.  Abdominal surgical history includes surgery for peptic ulcer disease in the 1970s.       Past Medical History:  Diagnosis Date  . Bradycardia    relative, precluding up titration of his beta-blocker  . CAD (coronary artery disease)   . HLD (hyperlipidemia)   . Non-ST elevated myocardial infarction (non-STEMI) ([place])    2/07: LHC with occluded OM, LAD 20% + 40%; normal LVF => PTCA of OM with wire passage alone   . PVC's (premature ventricular contractions)   . Syncope    hx  PUD  Patient Active Problem List   Diagnosis Date Noted  . Ischemic chest pain ([place]) 09/21/2019  . ATRIAL FIBRILLATION 06/15/2010  . PURE HYPERCHOLESTEROLEMIA 06/07/2010  . CORONARY ATHEROSCLEROSIS NATIVE CORONARY ARTERY 06/07/2010  . Hyperlipidemia, mixed 06/09/2009  . VENTRICULAR ECTOPY 12/06/2008  . CAD (coronary artery disease) 12/02/2008    Past Surgical History:  Procedure Laterality Date  . CARDIAC CATHETERIZATION    . coronary arteriogram    . left ventriculogram    . reperfusion of obtuse marginal with a wife alone    Surgery for peptic ulcer  Prior to Admission medications   Medication Sig Start Date End Date Taking? Authorizing Provider  aspirin EC 81 MG  tablet Take 1 tablet (81 mg total) by mouth daily. 03/21/17   Fay Records, MD  BEE POLLEN PO Take 1 capsule by mouth daily.    [provider]  metoprolol succinate (TOPROL-XL) 25 MG 24 hr tablet Take 1 tablet (25 mg total) by mouth daily. Please make yearly appt with Dr. Harrington Challenger for June for future refills. 1st attempt Patient taking differently: Take 25 mg by mouth at bedtime.  09/07/19   Fay Records, MD  nitroGLYCERIN (NITROSTAT) 0.4 MG SL tablet place 1 tablet under the tongue if needed every 5 minutes for chest pain for 3 doses IF NO RELIEF AFTER 3RD DOSE CALL PRESCRIBER OR 911. 10/24/18   Fay Records, MD  rosuvastatin (CRESTOR) 20 MG tablet Take 1 tablet (20 mg total) by mouth daily. 09/30/19   Fay Records, MD    Allergies Other; Tetanus toxoid, adsorbed; and Tetanus toxoids  Family History  Problem Relation Age of Onset  . Heart attack Mother   . Heart attack Father     Social History Social History   Tobacco Use  . Smoking status: Never Smoker  . Smokeless tobacco: Former Systems developer    Types: Chew  Substance Use Topics  . Alcohol use: No    Alcohol/week: 0.0 standard drinks  . Drug use: No    Review of Systems  Constitutional: No fever/chills Eyes: No visual changes. ENT: No sore throat. Cardiovascular: Denies chest pain. Respiratory: Denies shortness of breath. Gastrointestinal: Positive for abdominal  pain and nausea, no vomiting.  No diarrhea.  No constipation. Genitourinary: Negative for dysuria. Musculoskeletal: Negative for back pain. Skin: Negative for rash. Neurological: Negative for headaches, focal weakness or numbness.   ____________________________________________   PHYSICAL EXAM:  VITAL SIGNS: ED Triage Vitals  Enc Vitals Group     BP      Pulse      Resp      Temp      Temp src      SpO2      Weight      Height      Head Circumference      Peak Flow      Pain Score      Pain Loc      Pain Edu?      Excl. in GC?      Constitutional: Alert and oriented.  Uncomfortable appearing and in mild to moderate acute distress. Eyes: Conjunctivae are normal. PERRL. EOMI. Head: Atraumatic. Nose: No congestion/rhinnorhea. Mouth/Throat: Mucous membranes are mildly dry. Neck: No stridor.   Cardiovascular: Normal rate, regular rhythm. Grossly normal heart sounds.  Good peripheral circulation. Respiratory: Normal respiratory effort.  No retractions. Lungs CTAB. Gastrointestinal: Soft and moderately tender to palpation bilateral lower quadrants, left> right without rebound or guarding. No distention. No abdominal bruits. No CVA tenderness. Musculoskeletal: No lower extremity tenderness nor edema.  No joint effusions. Neurologic:  Normal speech and language. No gross focal neurologic deficits are appreciated.  Skin:  Skin is warm, dry and intact. No rash noted. Psychiatric: Mood and affect are normal. Speech and behavior are normal.  ____________________________________________   LABS (all labs ordered are listed, but only abnormal results are displayed)  Labs Reviewed  CBC WITH DIFFERENTIAL/PLATELET - Abnormal; Notable for the following components:      Result Value   WBC 12.1 (*)    RBC 4.14 (*)    Neutro Abs 9.4 (*)    All other components within normal limits  COMPREHENSIVE METABOLIC PANEL - Abnormal; Notable for the following components:   Glucose, Bld 175 (*)    Total Bilirubin 1.3 (*)    All other components within normal limits  SARS CORONAVIRUS 2 BY RT PCR (HOSPITAL ORDER, PERFORMED IN [facility])  LIPASE, BLOOD  URINALYSIS, COMPLETE (UACMP) WITH MICROSCOPIC  TROPONIN I (HIGH SENSITIVITY)  TROPONIN I (HIGH SENSITIVITY)   ____________________________________________  EKG  ED ECG REPORT I, Juliauna Stueve J, the attending physician, personally viewed and interpreted this ECG.   Date: 10/17/2019  EKG Time: 0437  Rate: 81  Rhythm: normal EKG, normal sinus rhythm  Axis: Normal   Intervals:none  ST&T Change: Nonspecific  ____________________________________________  RADIOLOGY  ED MD interpretation:  Pending  Official radiology report(s): No results found.  ____________________________________________   PROCEDURES  Procedure(s) performed (including Critical Care):  .1-3 Lead EKG Interpretation Performed by: Irean Hong, MD Authorized by: Irean Hong, MD     Interpretation: normal     ECG rate:  80   ECG rate assessment: normal     Rhythm: sinus rhythm     Ectopy: none     Conduction: normal   Comments:     Patient placed on cardiac monitor to monitor for arrhythmias    CRITICAL CARE Performed by: Irean Hong   Total critical care time: 30 minutes  Critical care time was exclusive of separately billable procedures and treating other patients.  Critical care was necessary to treat or prevent imminent or life-threatening deterioration.  Critical  care was time spent personally by me on the following activities: development of treatment plan with patient and/or surrogate as well as nursing, discussions with consultants, evaluation of patient's response to treatment, examination of patient, obtaining history from patient or surrogate, ordering and performing treatments and interventions, ordering and review of laboratory studies, ordering and review of radiographic studies, pulse oximetry and re-evaluation of patient's condition.  ____________________________________________   INITIAL IMPRESSION / ASSESSMENT AND PLAN / ED COURSE  As part of my medical decision making, I reviewed the following data within the electronic MEDICAL RECORD NUMBER Nursing notes reviewed and incorporated, Labs reviewed, EKG interpreted, Old chart reviewed, Radiograph reviewed and Notes from prior ED visits     [name] [name] was evaluated in Emergency Department on 10/17/2019 for the symptoms described in the history of present illness. He was evaluated in the context of  the global COVID-19 pandemic, which necessitated consideration that the patient might be at risk for infection with the SARS-CoV-2 virus that causes COVID-19. Institutional protocols and algorithms that pertain to the evaluation of patients at risk for COVID-19 are in a state of rapid change based on information released by regulatory bodies including the CDC and federal and state organizations. These policies and algorithms were followed during the patient's care in the ED.    86 year old male presenting with lower abdominal pain and nausea. Differential diagnosis includes, but is not limited to, acute appendicitis, renal colic, testicular torsion, urinary tract infection/pyelonephritis, prostatitis,  epididymitis, diverticulitis, small bowel obstruction or ileus, colitis, abdominal aortic aneurysm, gastroenteritis, hernia, etc.  Will obtain lab work, UA, CT abdomen/pelvis.  Initiate IV fluid hydration, IV morphine for pain paired with IV Zofran for nausea.  I have personally reviewed patient's chart and see his recent hospitalization on 09/21/2019 for chest pain. Clinical Course as of Oct 17 702  Sat Oct 17, 2019  9924 Nausea improved. Pain was better after Fentanyl; will repeat. Patient cannot drink PO contrast due to nausea/dry heaving.    [JS]  0703 Care transferred to Dr. Cyril Loosen at change of shift pending CT scan results. Anticipate hospitalization.   [JS]    Clinical Course User Index [JS] Irean Hong, MD     ____________________________________________   FINAL CLINICAL IMPRESSION(S) / ED DIAGNOSES  Final diagnoses:  Lower abdominal pain  Nausea     ED Discharge Orders    None       Note:  This document was prepared using Dragon voice recognition software and may include unintentional dictation errors.   Irean Hong, MD 10/17/19 [phone]

## 2019-10-17 NOTE — Anesthesia Procedure Notes (Signed)
Procedure Name: Intubation Date/Time: 10/17/2019 12:18 PM Performed by: Pearla Dubonnet, CRNA Pre-anesthesia Checklist: Patient identified, Emergency Drugs available, Suction available, Patient being monitored and Timeout performed Patient Re-evaluated:Patient Re-evaluated prior to induction Oxygen Delivery Method: Circle system utilized Preoxygenation: Pre-oxygenation with 100% oxygen Induction Type: IV induction Laryngoscope Size: McGraph and 4 Grade View: Grade I Tube type: Oral Tube size: 7.5 mm Number of attempts: 1 Airway Equipment and Method: Stylet Secured at: 21 cm Dental Injury: Teeth and Oropharynx as per pre-operative assessment

## 2019-10-17 NOTE — ED Notes (Signed)
Pt transported to OR

## 2019-10-17 NOTE — ED Triage Notes (Signed)
Patient to ED via EMS FOR LLQ pain that started about 0100 this morning. Patient states he has been "heaving" but nothing is coming out. Denies diarrhea.

## 2019-10-17 NOTE — ED Notes (Signed)
  EDP notified of minimal movement in pain scale, pt reports nausea and unable to drink

## 2019-10-17 NOTE — Progress Notes (Signed)
Pharmacy Antibiotic Note  [name] [name] is a 86 y.o. male admitted on 10/17/2019 with IAI.  Pharmacy has been consulted for Zosyn dosing.  Internal hernia with gangrenous small bowel s/p exploratory laparotomy, small bowel resection of 70 cm, and placement of wound vac on 5/29.   Plan: Zosyn 3.375g IV q8h (4 hour infusion).  Height: 5\' 7"  (170.2 cm) Weight: 78 kg (172 lb) IBW/kg (Calculated) : 66.1  Temp (24hrs), Avg:97.9 F (36.6 C), Min:97.9 F (36.6 C), Max:97.9 F (36.6 C)  Recent Labs  Lab 10/17/19 0442  WBC 12.1*  CREATININE 0.84    Estimated Creatinine Clearance: 62.3 mL/min (by C-G formula based on SCr of 0.84 mg/dL).    Allergies  Allergen Reactions  . Other Other (See Comments)    Tetanus shot  . Tetanus Toxoid, Adsorbed     Tetanus shot  . Tetanus Toxoids     Tetanus shot    Antimicrobials this admission: Zosyn 5/29 >>  Dose adjustments this admission:   Microbiology results:  Thank you for allowing pharmacy to be a part of this patient's care.  6/29 10/17/2019 2:17 PM

## 2019-10-17 NOTE — ED Notes (Signed)
Call from CT pt retching EDP aware

## 2019-10-17 NOTE — H&P (Signed)
CRITICAL CARE PROGRESS NOTE    Name: [name] [name] MRN: [id number] DOB: 11/27/1933     LOS: 0   SUBJECTIVE FINDINGS & SIGNIFICANT EVENTS   Patient description:  86 yo male hx of CAD, dyslipidemia, came in due to 2d abd pain and found to have dilated thick walled sm bowel loops consistent with bowel edema, had ex lap with finding of 60-70cm gangrenous bowel s/p resection and brought to MICU with open mid abdomen surgical site dressed w/wound vac. Patient required Neosynephrine pushes during case due to hypotension.   Lines / Drains: PIVx2   Cultures / Sepsis markers: none  Antibiotics: Zosyn    Protocols / Consultants: Surgery , PCCM    PAST MEDICAL HISTORY   Past Medical History:  Diagnosis Date  . Bradycardia    relative, precluding up titration of his beta-blocker  . CAD (coronary artery disease)   . HLD (hyperlipidemia)   . Non-ST elevated myocardial infarction (non-STEMI) ([place])    2/07: LHC with occluded OM, LAD 20% + 40%; normal LVF => PTCA of OM with wire passage alone   . PVC's (premature ventricular contractions)   . Syncope    hx     SURGICAL HISTORY   Past Surgical History:  Procedure Laterality Date  . CARDIAC CATHETERIZATION    . coronary arteriogram    . EXPLORATORY LAPAROTOMY  1970s   for bleeding peptic ulcer  . left ventriculogram    . reperfusion of obtuse marginal with a wife alone       FAMILY HISTORY   Family History  Problem Relation Age of Onset  . Heart attack Mother   . Heart attack Father      SOCIAL HISTORY   Social History   Tobacco Use  . Smoking status: Never Smoker  . Smokeless tobacco: Former Systems developer    Types: Chew  Substance Use Topics  . Alcohol use: No    Alcohol/week: 0.0 standard drinks  . Drug use: No     MEDICATIONS    Current Medication:  Current Facility-Administered Medications:  .  0.9 %  sodium chloride infusion, 250 mL, Intravenous, Continuous, Piscoya, Jose, MD .  chlorhexidine gluconate (MEDLINE KIT) (PERIDEX) 0.12 % solution 15 mL, 15 mL, Mouth Rinse, BID, Lanney Gins, Nolawi Kanady, MD .  Derrill Memo ON 10/18/2019] enoxaparin (LOVENOX) injection 40 mg, 40 mg, Subcutaneous, Q24H, Piscoya, Jose, MD .  fentaNYL 253mg in NS 2567m(1064mml) infusion-PREMIX, 0-400 mcg/hr, Intravenous, Continuous, Kamillah Didonato, MD, Last Rate: 30 mL/hr at 10/17/19 1600, 300 mcg/hr at 10/17/19 1600 .  HYDROmorphone (DILAUDID) injection 0.5 mg, 0.5 mg, Intravenous, Q4H PRN, Piscoya, Jose, MD .  lactated ringers infusion, , Intravenous, Continuous, Piscoya, Jose, MD, Last Rate: 100 mL/hr at 10/17/19 1157, New Bag at 10/17/19 1427 .  lactated ringers infusion, , Intravenous, Continuous, Aisley Whan, MD, Last Rate: 100 mL/hr at 10/17/19 1600, Rate Verify at 10/17/19 1600 .  MEDLINE mouth rinse, 15 mL, Mouth Rinse, 10 times per day, AleOttie GlazierD, 15 mL at 10/17/19 1508 .  metoprolol tartrate (LOPRESSOR) injection 5 mg, 5 mg, Intravenous, Q6H, Piscoya, Jose, MD .  midazolam (VERSED) injection 2 mg, 2 mg, Intravenous, Q2H PRN, AleLanney Ginsuad, MD .  norepinephrine (LEVOPHED) 4mg25m 250mL84mmix infusion, 2-10 mcg/min, Intravenous, Titrated, Piscoya, Jose, MD, Last Rate: 7.5 mL/hr at 10/17/19 1600, 2 mcg/min at 10/17/19 1600 .  ondansetron (ZOFRAN-ODT) disintegrating tablet 4 mg, 4 mg, Oral, Q6H PRN **OR** ondansetron (ZOFRAN) injection 4 mg, 4 mg, Intravenous, Q6H PRN,  Olean Ree, MD, 4 mg at 10/17/19 1211 .  pantoprazole (PROTONIX) injection 40 mg, 40 mg, Intravenous, QHS, Piscoya, Jose, MD .  piperacillin-tazobactam (ZOSYN) IVPB 3.375 g, 3.375 g, Intravenous, Q8H, Wang, Hannah L, RPH .  polyethylene glycol (MIRALAX / GLYCOLAX) packet 17 g, 17 g, Oral, Daily PRN, Piscoya, Jose, MD    ALLERGIES   Other; Tetanus toxoid,  adsorbed; and Tetanus toxoids    REVIEW OF SYSTEMS    Unable to obtain due to sedation on MV  PHYSICAL EXAMINATION   Vital Signs: Temp:  [97.9 F (36.6 C)-98.6 F (37 C)] 98.6 F (37 C) (05/29 1600) Pulse Rate:  [73-89] 73 (05/29 1600) Resp:  [12-23] 12 (05/29 1600) BP: (97-166)/(39-94) 97/61 (05/29 1600) SpO2:  [90 %-100 %] 100 % (05/29 1600) FiO2 (%):  [40 %] 40 % (05/29 1536) Weight:  [78 kg-84.4 kg] 84.4 kg (05/29 1426)  GENERAL:age appropriate HEAD: Normocephalic, atraumatic.  EYES: Pupils equal, round, reactive to light.  No scleral icterus.  MOUTH: Moist mucosal membrane. +OGT NECK: Supple. No thyromegaly. No nodules. No JVD.  PULMONARY: bibasilar crackles  CARDIOVASCULAR: S1 and S2. Regular rate and rhythm. No murmurs, rubs, or gallops.  GASTROINTESTINAL: Soft, nontender, non-distended. No masses. Positive bowel sounds. No hepatosplenomegaly.  MUSCULOSKELETAL: No swelling, clubbing, or edema.  NEUROLOGIC: Mild distress due to acute illness SKIN:intact,warm,dry   PERTINENT DATA     Infusions: . sodium chloride    . fentaNYL infusion INTRAVENOUS 300 mcg/hr (10/17/19 1600)  . lactated ringers 100 mL/hr at 10/17/19 1157  . lactated ringers 100 mL/hr at 10/17/19 1600  . norepinephrine (LEVOPHED) Adult infusion 2 mcg/min (10/17/19 1600)  . piperacillin-tazobactam (ZOSYN)  IV     Scheduled Medications: . chlorhexidine gluconate (MEDLINE KIT)  15 mL Mouth Rinse BID  . [START ON 10/18/2019] enoxaparin (LOVENOX) injection  40 mg Subcutaneous Q24H  . mouth rinse  15 mL Mouth Rinse 10 times per day  . metoprolol tartrate  5 mg Intravenous Q6H  . pantoprazole (PROTONIX) IV  40 mg Intravenous QHS   PRN Medications: HYDROmorphone (DILAUDID) injection, midazolam, ondansetron **OR** ondansetron (ZOFRAN) IV, polyethylene glycol Hemodynamic parameters:   Intake/Output: 05/28 0701 - 05/29 0700 In: 1000 [IV Piggyback:1000] Out: -   Ventilator  Settings: Vent Mode:  PRVC FiO2 (%):  [40 %] 40 % Set Rate:  [14 bmp] 14 bmp Vt Set:  [550 mL] 550 mL PEEP:  [5 cmH20] 5 cmH20 Plateau Pressure:  [16 cmH20-17 cmH20] 16 cmH20   LAB RESULTS:  Basic Metabolic Panel: Recent Labs  Lab 10/17/19 0442  NA 140  K 4.0  CL 101  CO2 26  GLUCOSE 175*  BUN 15  CREATININE 0.84  CALCIUM 9.6   Liver Function Tests: Recent Labs  Lab 10/17/19 0442  AST 29  ALT 29  ALKPHOS 62  BILITOT 1.3*  PROT 7.2  ALBUMIN 4.1   Recent Labs  Lab 10/17/19 0442  LIPASE 17   No results for input(s): AMMONIA in the last 168 hours. CBC: Recent Labs  Lab 10/17/19 0442  WBC 12.1*  NEUTROABS 9.4*  HGB 13.9  HCT 41.1  MCV 99.3  PLT 193   Cardiac Enzymes: No results for input(s): CKTOTAL, CKMB, CKMBINDEX, TROPONINI in the last 168 hours. BNP: Invalid input(s): POCBNP CBG: Recent Labs  Lab 10/17/19 1427  GLUCAP 184*       IMAGING RESULTS:  Imaging: CT Abdomen Pelvis W Contrast  Result Date: 10/17/2019 CLINICAL DATA:  Left lower quadrant abdominal pain, bowel obstruction  suspected EXAM: CT ABDOMEN AND PELVIS WITH CONTRAST TECHNIQUE: Multidetector CT imaging of the abdomen and pelvis was performed using the standard protocol following bolus administration of intravenous contrast. CONTRAST:  110m OMNIPAQUE IOHEXOL 300 MG/ML  SOLN COMPARISON:  None. FINDINGS: Lower chest: Patchy confluent subpleural reticulation and ground-glass opacity throughout both lung bases, unchanged from recent chest CT angiogram study. No acute abnormality at the lung bases. Coronary atherosclerosis. Surgical clips surround the esophagogastric junction. Hepatobiliary: Normal liver size. No liver mass. Normal gallbladder with no radiopaque cholelithiasis. No biliary ductal dilatation. Pancreas: Diffuse fatty infiltration of the pancreas with no pancreatic mass or duct dilation. Spleen: Normal size. No mass. Adrenals/Urinary Tract: Normal adrenals. Normal kidneys with no hydronephrosis and no  renal mass. Normal bladder. Stomach/Bowel: Stomach is nondistended with no acute gastric abnormality. There are a few mildly dilated thick-walled small bowel loops in right abdomen measuring up to 3.2 cm diameter with associated mesenteric edema. The proximal and distal small bowel loops are relatively collapsed. No pneumatosis. Stool present in the terminal ileum. Appendix not discretely visualized. Marked sigmoid diverticulosis with no definite large bowel wall thickening or significant pericolonic fat stranding. Vascular/Lymphatic: Atherosclerotic nonaneurysmal abdominal aorta. Patent portal, splenic, hepatic and renal veins. No pathologically enlarged lymph nodes in the abdomen or pelvis. Reproductive: Mild prostatomegaly with nonspecific internal prostatic calcification. Other: No pneumoperitoneum, ascites or focal fluid collection. Hernia repair mesh in the anterior pelvis. No evidence of a recurrent ventral hernia. Musculoskeletal: No aggressive appearing focal osseous lesions. Marked thoracolumbar spondylosis. IMPRESSION: 1. Several mildly dilated thick-walled small bowel loops in the right abdomen with associated mesenteric edema. Proximal and distal small bowel loops are nondilated. Internal hernia with associated small bowel obstruction cannot be excluded. Differential includes regional enteritis. No free air. No pneumatosis. Surgical consultation suggested. 2. Marked sigmoid diverticulosis with no evidence of acute diverticulitis. 3. Mild prostatomegaly. 4. Nonspecific pulmonary fibrosis at the lung bases. 5. Aortic Atherosclerosis (ICD10-I70.0). Electronically Signed   By: JIlona SorrelM.D.   On: 10/17/2019 07:43   '@PROBHOSP' @ CT Abdomen Pelvis W Contrast  Result Date: 10/17/2019 CLINICAL DATA:  Left lower quadrant abdominal pain, bowel obstruction suspected EXAM: CT ABDOMEN AND PELVIS WITH CONTRAST TECHNIQUE: Multidetector CT imaging of the abdomen and pelvis was performed using the standard  protocol following bolus administration of intravenous contrast. CONTRAST:  1066mOMNIPAQUE IOHEXOL 300 MG/ML  SOLN COMPARISON:  None. FINDINGS: Lower chest: Patchy confluent subpleural reticulation and ground-glass opacity throughout both lung bases, unchanged from recent chest CT angiogram study. No acute abnormality at the lung bases. Coronary atherosclerosis. Surgical clips surround the esophagogastric junction. Hepatobiliary: Normal liver size. No liver mass. Normal gallbladder with no radiopaque cholelithiasis. No biliary ductal dilatation. Pancreas: Diffuse fatty infiltration of the pancreas with no pancreatic mass or duct dilation. Spleen: Normal size. No mass. Adrenals/Urinary Tract: Normal adrenals. Normal kidneys with no hydronephrosis and no renal mass. Normal bladder. Stomach/Bowel: Stomach is nondistended with no acute gastric abnormality. There are a few mildly dilated thick-walled small bowel loops in right abdomen measuring up to 3.2 cm diameter with associated mesenteric edema. The proximal and distal small bowel loops are relatively collapsed. No pneumatosis. Stool present in the terminal ileum. Appendix not discretely visualized. Marked sigmoid diverticulosis with no definite large bowel wall thickening or significant pericolonic fat stranding. Vascular/Lymphatic: Atherosclerotic nonaneurysmal abdominal aorta. Patent portal, splenic, hepatic and renal veins. No pathologically enlarged lymph nodes in the abdomen or pelvis. Reproductive: Mild prostatomegaly with nonspecific internal prostatic calcification. Other: No pneumoperitoneum, ascites  or focal fluid collection. Hernia repair mesh in the anterior pelvis. No evidence of a recurrent ventral hernia. Musculoskeletal: No aggressive appearing focal osseous lesions. Marked thoracolumbar spondylosis. IMPRESSION: 1. Several mildly dilated thick-walled small bowel loops in the right abdomen with associated mesenteric edema. Proximal and distal small  bowel loops are nondilated. Internal hernia with associated small bowel obstruction cannot be excluded. Differential includes regional enteritis. No free air. No pneumatosis. Surgical consultation suggested. 2. Marked sigmoid diverticulosis with no evidence of acute diverticulitis. 3. Mild prostatomegaly. 4. Nonspecific pulmonary fibrosis at the lung bases. 5. Aortic Atherosclerosis (ICD10-I70.0). Electronically Signed   By: Ilona Sorrel M.D.   On: 10/17/2019 07:43     ASSESSMENT AND PLAN    -Multidisciplinary rounds held today  Acute Abdomen due to internal hernia with gangrenous bowel  -surgery on case - appreciate input -wound vac with small volume serosang -follow surgical recommendations -IVF - LR 100cc/h  Transient hypotension  - IVF and Levophed for support  - may need central line if hypotension persists ICU telemetry monitoring   ID -continue IV abx as prescibed -follow up cultures  GI/Nutrition GI PROPHYLAXIS as indicated DIET-->TF's as tolerated Constipation protocol as indicated  ENDO - ICU hypoglycemic\Hyperglycemia protocol -check FSBS per protocol   ELECTROLYTES -follow labs as needed -replace as needed -pharmacy consultation   DVT/GI PRX ordered -SCDs  TRANSFUSIONS AS NEEDED MONITOR FSBS ASSESS the need for LABS as needed   Critical care provider statement:    Critical care time (minutes):  33   Critical care time was exclusive of:  Separately billable procedures and treating other patients   Critical care was necessary to treat or prevent imminent or life-threatening deterioration of the following conditions:  Gangene of bowel, s/p ex lap, hypotension, multiple comoribid conditions.   Critical care was time spent personally by me on the following activities:  Development of treatment plan with patient or surrogate, discussions with consultants, evaluation of patient's response to treatment, examination of patient, obtaining history from patient or  surrogate, ordering and performing treatments and interventions, ordering and review of laboratory studies and re-evaluation of patient's condition.  I assumed direction of critical care for this patient from another provider in my specialty: no    This document was prepared using Dragon voice recognition software and may include unintentional dictation errors.    Ottie Glazier, M.D.  Division of [facility]

## 2019-10-17 NOTE — ED Notes (Signed)
Pt given phone to speak with Bubba Hales, pt's friend (who is his local contact) after giving this RN permission to give an update to friend and inform him of his plan to go to surgery today.   Bubba Hales [phone]

## 2019-10-18 ENCOUNTER — Inpatient Hospital Stay: Payer: Medicare Other | Admitting: Registered Nurse

## 2019-10-18 ENCOUNTER — Encounter
Admission: EM | Disposition: A | Discharge status: Home Health | Payer: Self-pay | Source: Home / Self Care | Attending: Surgery

## 2019-10-18 HISTORY — PX: LAPAROTOMY: SHX154

## 2019-10-18 HISTORY — PX: APPLICATION OF WOUND VAC: SHX5189

## 2019-10-18 HISTORY — PX: BOWEL RESECTION: SHX1257

## 2019-10-18 LAB — BASIC METABOLIC PANEL
Anion gap: 6 (ref 5–15)
BUN: 22 mg/dL (ref 8–23)
CO2: 26 mmol/L (ref 22–32)
Calcium: 8.1 mg/dL — ABNORMAL LOW (ref 8.9–10.3)
Chloride: 108 mmol/L (ref 98–111)
Creatinine, Ser: 0.84 mg/dL (ref 0.61–1.24)
GFR calc Af Amer: >60 mL/min (ref >60)
GFR calc non Af Amer: >60 mL/min (ref >60)
Glucose, Bld: 138 mg/dL — ABNORMAL HIGH (ref 70–99)
Potassium: 4.5 mmol/L (ref 3.5–5.1)
Sodium: 140 mmol/L (ref 135–145)

## 2019-10-18 LAB — MAGNESIUM: Magnesium: 1.6 mg/dL — ABNORMAL LOW (ref 1.7–2.4)

## 2019-10-18 LAB — CBC
HCT: 37 % — ABNORMAL LOW (ref 39.0–52.0)
Hemoglobin: 12.1 g/dL — ABNORMAL LOW (ref 13.0–17.0)
MCH: 33.6 pg (ref 26.0–34.0)
MCHC: 32.7 g/dL (ref 30.0–36.0)
MCV: 102.8 fL — ABNORMAL HIGH (ref 80.0–100.0)
Platelets: 152 10*3/uL (ref 150–400)
RBC: 3.6 MIL/uL — ABNORMAL LOW (ref 4.22–5.81)
RDW: 12.1 % (ref 11.5–15.5)
WBC: 21.6 10*3/uL — ABNORMAL HIGH (ref 4.0–10.5)
nRBC: 0 % (ref 0.0–0.2)

## 2019-10-18 LAB — PHOSPHORUS: Phosphorus: 3.8 mg/dL (ref 2.5–4.6)

## 2019-10-18 SURGERY — LAPAROTOMY, EXPLORATORY
Anesthesia: General | Site: Abdomen

## 2019-10-18 MED ORDER — KETAMINE HCL 50 MG/ML IJ SOLN
INTRAMUSCULAR | Status: AC
  Filled 2019-10-18: qty 10

## 2019-10-18 MED ORDER — AMIODARONE HCL IN DEXTROSE 360-4.14 MG/200ML-% IV SOLN
60.0000 mg/h | INTRAVENOUS | Status: DC
  Administered 2019-10-19 (×2): 60 mg/h via INTRAVENOUS
  Filled 2019-10-18 (×2): qty 200

## 2019-10-18 MED ORDER — PHENYLEPHRINE HCL (PRESSORS) 10 MG/ML IV SOLN
INTRAVENOUS | Status: AC
  Filled 2019-10-18: qty 1

## 2019-10-18 MED ORDER — PROPOFOL 10 MG/ML IV BOLUS
INTRAVENOUS | Status: AC
  Filled 2019-10-18: qty 20

## 2019-10-18 MED ORDER — ACETAMINOPHEN 10 MG/ML IV SOLN
INTRAVENOUS | Status: DC | PRN
  Administered 2019-10-18: 1000 mg via INTRAVENOUS

## 2019-10-18 MED ORDER — AMIODARONE HCL IN DEXTROSE 360-4.14 MG/200ML-% IV SOLN
30.0000 mg/h | INTRAVENOUS | Status: DC
  Administered 2019-10-19: 30 mg/h via INTRAVENOUS

## 2019-10-18 MED ORDER — FENTANYL CITRATE (PF) 100 MCG/2ML IJ SOLN
INTRAMUSCULAR | Status: AC
  Filled 2019-10-18: qty 2

## 2019-10-18 MED ORDER — MAGNESIUM SULFATE 2 GM/50ML IV SOLN
2.0000 g | Freq: Once | INTRAVENOUS | Status: AC
  Administered 2019-10-18: 2 g via INTRAVENOUS
  Filled 2019-10-18: qty 50

## 2019-10-18 MED ORDER — PROPOFOL 10 MG/ML IV BOLUS
INTRAVENOUS | Status: DC | PRN
  Administered 2019-10-18 (×2): 20 mg via INTRAVENOUS
  Administered 2019-10-18: 50 mg via INTRAVENOUS
  Administered 2019-10-18 (×3): 20 mg via INTRAVENOUS

## 2019-10-18 MED ORDER — LIDOCAINE HCL (CARDIAC) PF 100 MG/5ML IV SOSY
PREFILLED_SYRINGE | INTRAVENOUS | Status: DC | PRN
  Administered 2019-10-18: 40 mg via INTRAVENOUS
  Administered 2019-10-18: 60 mg via INTRAVENOUS

## 2019-10-18 MED ORDER — DEXAMETHASONE SODIUM PHOSPHATE 10 MG/ML IJ SOLN
INTRAMUSCULAR | Status: AC
  Filled 2019-10-18: qty 1

## 2019-10-18 MED ORDER — DEXAMETHASONE SODIUM PHOSPHATE 10 MG/ML IJ SOLN
INTRAMUSCULAR | Status: DC | PRN
  Administered 2019-10-18: 8 mg via INTRAVENOUS

## 2019-10-18 MED ORDER — SODIUM CHLORIDE (PF) 0.9 % IJ SOLN
INTRAMUSCULAR | Status: AC
  Filled 2019-10-18: qty 10

## 2019-10-18 MED ORDER — METOPROLOL TARTRATE 5 MG/5ML IV SOLN: 2.5000 mg | Freq: Four times a day (QID) | INTRAVENOUS | Status: DC | PRN

## 2019-10-18 MED ORDER — KETAMINE HCL 10 MG/ML IJ SOLN
INTRAMUSCULAR | Status: DC | PRN
  Administered 2019-10-18 (×3): 10 mg via INTRAVENOUS

## 2019-10-18 MED ORDER — METOPROLOL TARTRATE 5 MG/5ML IV SOLN
INTRAVENOUS | Status: DC | PRN
Start: 2019-10-18 — End: 2019-10-18
  Administered 2019-10-18: 3 mg via INTRAVENOUS

## 2019-10-18 MED ORDER — EPHEDRINE 5 MG/ML INJ
INTRAVENOUS | Status: AC
  Filled 2019-10-18: qty 10

## 2019-10-18 MED ORDER — ROCURONIUM BROMIDE 100 MG/10ML IV SOLN
INTRAVENOUS | Status: DC | PRN
  Administered 2019-10-18: 20 mg via INTRAVENOUS
  Administered 2019-10-18 (×2): 30 mg via INTRAVENOUS

## 2019-10-18 MED ORDER — LIDOCAINE HCL (PF) 2 % IJ SOLN
INTRAMUSCULAR | Status: AC
  Filled 2019-10-18: qty 5

## 2019-10-18 MED ORDER — SUGAMMADEX SODIUM 200 MG/2ML IV SOLN
INTRAVENOUS | Status: DC | PRN
  Administered 2019-10-18: 170 mg via INTRAVENOUS

## 2019-10-18 MED ORDER — SODIUM CHLORIDE 0.9 % IV SOLN
INTRAVENOUS | Status: DC | PRN
  Administered 2019-10-18: 30 ug/min via INTRAVENOUS

## 2019-10-18 MED ORDER — ACETAMINOPHEN 10 MG/ML IV SOLN
INTRAVENOUS | Status: AC
  Filled 2019-10-18: qty 100

## 2019-10-18 MED ORDER — ONDANSETRON HCL 4 MG/2ML IJ SOLN
INTRAMUSCULAR | Status: DC | PRN
  Administered 2019-10-18: 4 mg via INTRAVENOUS

## 2019-10-18 MED ORDER — PHENYLEPHRINE HCL (PRESSORS) 10 MG/ML IV SOLN
INTRAVENOUS | Status: DC | PRN
  Administered 2019-10-18 (×2): 100 ug via INTRAVENOUS
  Administered 2019-10-18: 200 ug via INTRAVENOUS
  Administered 2019-10-18: 100 ug via INTRAVENOUS
  Administered 2019-10-18: 200 ug via INTRAVENOUS
  Administered 2019-10-18: 100 ug via INTRAVENOUS

## 2019-10-18 MED ORDER — AMIODARONE LOAD VIA INFUSION
150.0000 mg | Freq: Once | INTRAVENOUS | Status: AC
  Administered 2019-10-19: 150 mg via INTRAVENOUS
  Filled 2019-10-18: qty 83.34

## 2019-10-18 MED ORDER — KETOROLAC TROMETHAMINE 30 MG/ML IJ SOLN
15.0000 mg | Freq: Four times a day (QID) | INTRAMUSCULAR | Status: AC
  Administered 2019-10-18 – 2019-10-23 (×20): 15 mg via INTRAVENOUS
  Filled 2019-10-18 (×21): qty 1

## 2019-10-18 MED ORDER — ONDANSETRON HCL 4 MG/2ML IJ SOLN
INTRAMUSCULAR | Status: AC
  Filled 2019-10-18: qty 2

## 2019-10-18 MED ORDER — FENTANYL CITRATE (PF) 100 MCG/2ML IJ SOLN
INTRAMUSCULAR | Status: DC | PRN
  Administered 2019-10-18: 25 ug via INTRAVENOUS
  Administered 2019-10-18: 50 ug via INTRAVENOUS
  Administered 2019-10-18: 25 ug via INTRAVENOUS

## 2019-10-18 MED ORDER — EPHEDRINE SULFATE 50 MG/ML IJ SOLN
INTRAMUSCULAR | Status: DC | PRN
  Administered 2019-10-18 (×3): 10 mg via INTRAVENOUS

## 2019-10-18 MED ORDER — METOPROLOL TARTRATE 5 MG/5ML IV SOLN
INTRAVENOUS | Status: AC
  Filled 2019-10-18: qty 5

## 2019-10-18 SURGICAL SUPPLY — 44 items
APL PRP STRL LF ISPRP CHG 10.5 (MISCELLANEOUS) ×2
APPLICATOR CHLORAPREP 10.5 ORG (MISCELLANEOUS) ×4 IMPLANT
BRR ADH 6X5 SEPRAFILM 1 SHT (MISCELLANEOUS) ×2
BULB RESERV EVAC DRAIN JP 100C (MISCELLANEOUS) ×2 IMPLANT
CANISTER SUCT 1200ML W/VALVE (MISCELLANEOUS) ×4 IMPLANT
COVER WAND RF STERILE (DRAPES) ×4 IMPLANT
DRAIN CHANNEL JP 19F (MISCELLANEOUS) ×2 IMPLANT
DRAPE LAPAROTOMY 100X77 ABD (DRAPES) ×4 IMPLANT
DRSG OPSITE POSTOP 4X12 (GAUZE/BANDAGES/DRESSINGS) ×4 IMPLANT
DRSG TEGADERM 4X10 (GAUZE/BANDAGES/DRESSINGS) ×4 IMPLANT
ELECT CAUTERY BLADE 6.4 (BLADE) ×4 IMPLANT
ELECT REM PT RETURN 9FT ADLT (ELECTROSURGICAL) ×4
ELECTRODE REM PT RTRN 9FT ADLT (ELECTROSURGICAL) ×2 IMPLANT
GAUZE SPONGE 4X4 12PLY STRL (GAUZE/BANDAGES/DRESSINGS) ×4 IMPLANT
GLOVE SURG SYN 7.0 (GLOVE) ×8 IMPLANT
GLOVE SURG SYN 7.0 PF PI (GLOVE) ×4 IMPLANT
GLOVE SURG SYN 7.5  E (GLOVE) ×8
GLOVE SURG SYN 7.5 E (GLOVE) ×4 IMPLANT
GLOVE SURG SYN 7.5 PF PI (GLOVE) ×4 IMPLANT
GOWN STRL REUS W/ TWL LRG LVL3 (GOWN DISPOSABLE) ×8 IMPLANT
GOWN STRL REUS W/TWL LRG LVL3 (GOWN DISPOSABLE) ×16
HANDLE SUCTION POOLE (INSTRUMENTS) IMPLANT
LABEL OR SOLS (LABEL) ×4 IMPLANT
LIGASURE IMPACT 36 18CM CVD LR (INSTRUMENTS) ×4 IMPLANT
NEEDLE HYPO 22GX1.5 SAFETY (NEEDLE) ×4 IMPLANT
NS IRRIG 1000ML POUR BTL (IV SOLUTION) ×4 IMPLANT
PACK BASIN MAJOR (MISCELLANEOUS) ×4 IMPLANT
PACK COLON CLEAN CLOSURE (MISCELLANEOUS) ×4 IMPLANT
RELOAD PROXIMATE 75MM BLUE (ENDOMECHANICALS) ×12 IMPLANT
RELOAD STAPLE 75 3.8 BLU REG (ENDOMECHANICALS) IMPLANT
SEPRAFILM MEMBRANE 5X6 (MISCELLANEOUS) ×4 IMPLANT
STAPLER PROXIMATE 75MM BLUE (STAPLE) ×2 IMPLANT
STAPLER SKIN PROX 35W (STAPLE) ×4 IMPLANT
SUCTION POOLE HANDLE (INSTRUMENTS) ×4
SUT PDS AB 1 CT1 36 (SUTURE) ×4 IMPLANT
SUT PROLENE 0 CT 1 30 (SUTURE) ×12 IMPLANT
SUT SILK 2 0 (SUTURE) ×4
SUT SILK 2-0 18XBRD TIE 12 (SUTURE) ×2 IMPLANT
SUT SILK 3-0 (SUTURE) ×8 IMPLANT
SUT VIC AB 3-0 SH 27 (SUTURE) ×4
SUT VIC AB 3-0 SH 27X BRD (SUTURE) ×2 IMPLANT
SYR 10ML LL (SYRINGE) ×4 IMPLANT
SYR BULB IRRIG 60ML STRL (SYRINGE) ×2 IMPLANT
TRAY FOLEY MTR SLVR 16FR STAT (SET/KITS/TRAYS/PACK) ×4 IMPLANT

## 2019-10-18 NOTE — Progress Notes (Signed)
Pt. Extubated to 2lnc,sat 99,rr 16.No apparent distress noted at this time.

## 2019-10-18 NOTE — Anesthesia Postprocedure Evaluation (Deleted)
Anesthesia Post Note  Patient: [name] [name]  Procedure(s) Performed: EXPLORATORY LAPAROTOMY (N/A )  Patient location during evaluation: SICU Anesthesia Type: General Level of consciousness: sedated Pain management: pain level controlled Vital Signs Assessment: post-procedure vital signs reviewed and stable Respiratory status: patient remains intubated per anesthesia plan Cardiovascular status: stable Postop Assessment: no apparent nausea or vomiting Anesthetic complications: no     Last Vitals:  Vitals:   10/18/19 0400 10/18/19 0500  BP: 93/60 97/64  Pulse: 74 66  Resp: (!) 9 (!) 0  Temp: 36.7 C   SpO2: 99% 100%    Last Pain:  Vitals:   10/18/19 0400  TempSrc: Oral  PainSc:                  [name]

## 2019-10-18 NOTE — Progress Notes (Signed)
Pt to OR accompanied by physician and CRNA.

## 2019-10-18 NOTE — Op Note (Signed)
  Procedure Date:  10/18/2019  Pre-operative Diagnosis:  Open abdomen with small bowel in discontinuity  Post-operative Diagnosis:  Same  Procedure:  Exploratory Laparotomy, small bowel resection with anastomosis, closure of abdominal wall fascia, and Prevena vac placement < 50 cm  Surgeon:  Howie Ill, MD  Anesthesia:  General endotracheal  Estimated Blood Loss:  10 ml  Specimens:  Small bowel segment of approximately 15 cm  Complications:  None  Indications for Procedure:  This is a 86 y.o. male s/p exploratory laparotomy yesterday with small bowel resection of gangrenous small bowel segment of 60-70 cm.  There was a questionable segment of about 60 cm that was ischemic, but seemed to be improving during the surgery.  As such, he was left in discontinuity with open abdomen for second look today.  The risks of bleeding, abscess or infection, injury to surrounding structures, and need for further procedures were all discussed with the patient and was willing to proceed.  Description of Procedure: The patient was correctly identified in the preoperative area and brought into the operating room.  The patient was placed supine with VTE prophylaxis in place.  Appropriate time-outs were performed.  Anesthesia was induced and the patient was intubated.  Foley catheter was placed.  Appropriate antibiotics were infused.  The ABthera wound vac external component was removed.  The abdomen was prepped and draped in a sterile fashion.  The internal drape of the vac was removed and the small bowel was eviscerated.  The distal segment that was questionable yesterday had significantly improved.  Of that area, the proximal 15 cm were still ischemic, with decreased doppler signal.  Thus it was decided to excise this section.  A window in the mesentery was created and a blue load GIA stapler was fired across.  LigaSure was used to go across the mesentery of the section to be removed, and then it was sent  to pathology.  The two ends of the small bowel remaining were aligned using 3-0 Silk sutures, and then two enterotomies were created to insert the stapler arms, and another blue load was fired creating a common channel.  This enterotomy was then closed with another blue load.  3-0 Silk sutures were used to imbricate the staple line.  The mesenteric defect was closed with 3-0 Silk as well.  The abdomen was then thoroughly irrigated.   We then changed for our clean closure and the field was draped again.  A 19 Fr. Blake drain was inserted through the right lower quadrant, going near the anastomosis and to the pelvis.  The fascia was then closed with #1 PDS sutures.  The wound was irrigated and the skin was closed with staples.  A Prevena vac was placed over the incision.  The drain was secured with 3-0 Nylon suture and then dressed with 4x4 gauze and TegaDerm.    The patient was then transferred back to the ICU, still intubated, but awake and off pressors, for further management.  The patient tolerated the procedure well and all counts were correct at the end of the case.   Howie Ill, MD

## 2019-10-18 NOTE — Anesthesia Preprocedure Evaluation (Signed)
Anesthesia Evaluation  Patient identified by MRN, date of birth, ID band Patient confused    Reviewed: Allergy & Precautions, NPO status , Patient's Chart, lab work & pertinent test results, Unable to perform ROS - Chart review only  History of Anesthesia Complications Negative for: history of anesthetic complications  Airway Mallampati: Intubated       Dental   Pulmonary neg sleep apnea, neg COPD, Not current smoker,           Cardiovascular + CAD, + Past MI and +CHF (LVEF 50%)  (-) Valvular Problems/Murmurs     Neuro/Psych neg Seizures    GI/Hepatic Neg liver ROS, neg GERD  ,N/V abdominal pain, SBO   Endo/Other  neg diabetes  Renal/GU negative Renal ROS     Musculoskeletal   Abdominal   Peds  Hematology   Anesthesia Other Findings   Reproductive/Obstetrics                             Anesthesia Physical  Anesthesia Plan  ASA: III and emergent  Anesthesia Plan: General   Post-op Pain Management:    Induction: Intravenous  PONV Risk Score and Plan: 2 and Ondansetron and Dexamethasone  Airway Management Planned: Oral ETT  Additional Equipment:   Intra-op Plan:   Post-operative Plan: Post-operative intubation/ventilation  Informed Consent: I have reviewed the patients History and Physical, chart, labs and discussed the procedure including the risks, benefits and alternatives for the proposed anesthesia with the patient or authorized representative who has indicated his/her understanding and acceptance.       Plan Discussed with:   Anesthesia Plan Comments:         Anesthesia Quick Evaluation

## 2019-10-18 NOTE — Transfer of Care (Signed)
Immediate Anesthesia Transfer of Care Note  Patient: [name] [name]  Procedure(s) Performed: EXPLORATORY LAPAROTOMY (N/A Abdomen) APPLICATION OF WOUND VAC (Abdomen) SMALL BOWEL RESECTION (Abdomen)  Patient Location: ICU  Anesthesia Type:General  Level of Consciousness: drowsy  Airway & Oxygen Therapy: Patient Spontanous Breathing and Patient remains intubated per anesthesia plan  Post-op Assessment: Report given to RN and Post -op Vital signs reviewed and stable  Post vital signs: Reviewed and stable  Last Vitals:  Vitals Value Taken Time  BP 137/77 10/18/19 1218  Temp    Pulse 102 10/18/19 1227  Resp 0 10/18/19 1227  SpO2 97 % 10/18/19 1227  Vitals shown include unvalidated device data.  Last Pain:  Vitals:   10/18/19 0800  TempSrc: Axillary  PainSc:          Complications: No apparent anesthesia complications

## 2019-10-18 NOTE — Progress Notes (Signed)
Pt back from OR

## 2019-10-18 NOTE — Progress Notes (Signed)
CRITICAL CARE PROGRESS NOTE    Name: [name] [name] MRN: [id number] DOB: 1934/02/20     LOS: 1   SUBJECTIVE FINDINGS & SIGNIFICANT EVENTS   Patient description:  86 yo male hx of CAD, dyslipidemia, came in due to 2d abd pain and found to have dilated thick walled sm bowel loops consistent with bowel edema, had ex lap with finding of 60-70cm gangrenous bowel s/p resection and brought to MICU with open mid abdomen surgical site dressed w/wound vac. Patient required Neosynephrine pushes during case due to hypotension.   Lines / Drains: PIVx2   Cultures / Sepsis markers: none  Antibiotics: Zosyn    Protocols / Consultants: Surgery , PCCM  10/18/19- patient post op today was weaned down on mechanical ventilation and extubated without complications.   PAST MEDICAL HISTORY   Past Medical History:  Diagnosis Date  . Bradycardia    relative, precluding up titration of his beta-blocker  . CAD (coronary artery disease)   . HLD (hyperlipidemia)   . Non-ST elevated myocardial infarction (non-STEMI) ([place])    2/07: LHC with occluded OM, LAD 20% + 40%; normal LVF => PTCA of OM with wire passage alone   . PVC's (premature ventricular contractions)   . Syncope    hx     SURGICAL HISTORY   Past Surgical History:  Procedure Laterality Date  . APPLICATION OF WOUND VAC N/A 10/17/2019   Procedure: APPLICATION OF WOUND VAC;  Surgeon: Olean Ree, MD;  Location: [facility] ORS;  Service: General;  Laterality: N/A;  [id number]   . BOWEL RESECTION N/A 10/17/2019   Procedure: SMALL BOWEL RESECTION;  Surgeon: Olean Ree, MD;  Location: [facility] ORS;  Service: General;  Laterality: N/A;  . CARDIAC CATHETERIZATION    . coronary arteriogram    . EXPLORATORY LAPAROTOMY  1970s   for bleeding peptic ulcer  . LAPAROTOMY N/A  10/17/2019   Procedure: EXPLORATORY LAPAROTOMY;  Surgeon: Olean Ree, MD;  Location: [facility] ORS;  Service: General;  Laterality: N/A;  . left ventriculogram    . reperfusion of obtuse marginal with a wife alone       FAMILY HISTORY   Family History  Problem Relation Age of Onset  . Heart attack Mother   . Heart attack Father      SOCIAL HISTORY   Social History   Tobacco Use  . Smoking status: Never Smoker  . Smokeless tobacco: Former Systems developer    Types: Chew  Substance Use Topics  . Alcohol use: No    Alcohol/week: 0.0 standard drinks  . Drug use: No     MEDICATIONS   Current Medication:  Current Facility-Administered Medications:  .  0.9 %  sodium chloride infusion, 250 mL, Intravenous, Continuous, Piscoya, Jose, MD .  chlorhexidine gluconate (MEDLINE KIT) (PERIDEX) 0.12 % solution 15 mL, 15 mL, Mouth Rinse, BID, Piscoya, Jose, MD, 15 mL at 10/18/19 0813 .  Chlorhexidine Gluconate Cloth 2 % PADS 6 each, 6 each, Topical, Daily, Piscoya, Jose, MD, 6 each at 10/18/19 1633 .  enoxaparin (LOVENOX) injection 40 mg, 40 mg, Subcutaneous, Q24H, Piscoya, Jose, MD, 40 mg at 10/17/19 2353 .  fentaNYL 2519mg in NS 2539m(1085mml) infusion-PREMIX, 0-400 mcg/hr, Intravenous, Continuous, Piscoya, Jose, MD, Stopped at 10/18/19 0914 .  HYDROmorphone (DILAUDID) injection 0.5 mg, 0.5 mg, Intravenous, Q4H PRN, Piscoya, Jose, MD .  ketorolac (TORADOL) 30 MG/ML injection 15 mg, 15 mg, Intravenous, Q6H, Piscoya, Jose, MD, 15 mg at 10/18/19 1712 .  lactated ringers infusion, , Intravenous,  Continuous, Piscoya, Jose, MD, Last Rate: 100 mL/hr at 10/18/19 1600, Rate Verify at 10/18/19 1600 .  lactated ringers infusion, , Intravenous, Continuous, Piscoya, Jose, MD, Stopped at 10/18/19 [phone] .  MEDLINE mouth rinse, 15 mL, Mouth Rinse, 10 times per day, Piscoya, Jose, MD, 15 mL at 10/18/19 1709 .  metoprolol tartrate (LOPRESSOR) injection 5 mg, 5 mg, Intravenous, Q6H, Piscoya, Jose, MD, 5 mg at 10/18/19  1712 .  midazolam (VERSED) injection 2 mg, 2 mg, Intravenous, Q2H PRN, Piscoya, Jose, MD, 2 mg at 10/18/19 0645 .  norepinephrine (LEVOPHED) 18m in 2531mpremix infusion, 2-10 mcg/min, Intravenous, Titrated, Piscoya, Jose, MD, Stopped at 10/17/19 1805 .  ondansetron (ZOFRAN-ODT) disintegrating tablet 4 mg, 4 mg, Oral, Q6H PRN **OR** ondansetron (ZOFRAN) injection 4 mg, 4 mg, Intravenous, Q6H PRN, Piscoya, Jose, MD, 4 mg at 10/17/19 1211 .  pantoprazole (PROTONIX) injection 40 mg, 40 mg, Intravenous, QHS, Piscoya, Jose, MD, 40 mg at 10/17/19 2124 .  piperacillin-tazobactam (ZOSYN) IVPB 3.375 g, 3.375 g, Intravenous, Q8H, Piscoya, Jose, MD, Last Rate: 12.5 mL/hr at 10/18/19 1600, Rate Verify at 10/18/19 1600 .  polyethylene glycol (MIRALAX / GLYCOLAX) packet 17 g, 17 g, Oral, Daily PRN, Piscoya, Jose, MD    ALLERGIES   Other; Tetanus toxoid, adsorbed; and Tetanus toxoids    REVIEW OF SYSTEMS    Unable to obtain due to sedation on MV  PHYSICAL EXAMINATION   Vital Signs: Temp:  [98 F (36.7 C)-98.7 F (37.1 C)] 98.3 F (36.8 C) (05/30 1600) Pulse Rate:  [60-110] 96 (05/30 1700) Resp:  [0-18] 10 (05/30 1700) BP: (93-141)/(60-84) 139/66 (05/30 1700) SpO2:  [91 %-100 %] 91 % (05/30 1700) FiO2 (%):  [28 %-40 %] 28 % (05/30 1315)  GENERAL:age appropriate HEAD: Normocephalic, atraumatic.  EYES: Pupils equal, round, reactive to light.  No scleral icterus.  MOUTH: Moist mucosal membrane. +OGT NECK: Supple. No thyromegaly. No nodules. No JVD.  PULMONARY: bibasilar crackles  CARDIOVASCULAR: S1 and S2. Regular rate and rhythm. No murmurs, rubs, or gallops.  GASTROINTESTINAL: Soft, nontender, non-distended. No masses. Positive bowel sounds. No hepatosplenomegaly.  MUSCULOSKELETAL: No swelling, clubbing, or edema.  NEUROLOGIC: Mild distress due to acute illness SKIN:intact,warm,dry   PERTINENT DATA     Infusions: . sodium chloride    . fentaNYL infusion INTRAVENOUS Stopped  (10/18/19 0914)  . lactated ringers 100 mL/hr at 10/18/19 1600  . lactated ringers Stopped (10/18/19 0658)  . norepinephrine (LEVOPHED) Adult infusion Stopped (10/17/19 1805)  . piperacillin-tazobactam (ZOSYN)  IV 12.5 mL/hr at 10/18/19 1600   Scheduled Medications: . chlorhexidine gluconate (MEDLINE KIT)  15 mL Mouth Rinse BID  . Chlorhexidine Gluconate Cloth  6 each Topical Daily  . enoxaparin (LOVENOX) injection  40 mg Subcutaneous Q24H  . ketorolac  15 mg Intravenous Q6H  . mouth rinse  15 mL Mouth Rinse 10 times per day  . metoprolol tartrate  5 mg Intravenous Q6H  . pantoprazole (PROTONIX) IV  40 mg Intravenous QHS   PRN Medications: HYDROmorphone (DILAUDID) injection, midazolam, ondansetron **OR** ondansetron (ZOFRAN) IV, polyethylene glycol Hemodynamic parameters:   Intake/Output: 05/29 0701 - 05/30 0700 In: 3715.8 [I.V.:3665.8; IV Piggyback:50] Out: 1055 [Urine:500; Emesis/NG output:50; Drains:475; Blood:30]  Ventilator  Settings: Vent Mode: PSV FiO2 (%):  [28 %-40 %] 28 % Set Rate:  [14 bmp] 14 bmp Vt Set:  [550 mL] 550 mL PEEP:  [5 cmH20] 5 cmH20 Pressure Support:  [5 cmH20] 5 cmH20 Plateau Pressure:  [16 cmH20] 16 cmH20   LAB RESULTS:  Basic Metabolic Panel: Recent Labs  Lab 10/17/19 0442 10/17/19 0442 10/17/19 1930 10/18/19 0531  NA 140  --  140 140  K 4.0   < > 4.5 4.5  CL 101  --  108 108  CO2 26  --  26 26  GLUCOSE 175*  --  175* 138*  BUN 15  --  21 22  CREATININE 0.84  --  0.77 0.84  CALCIUM 9.6  --  8.1* 8.1*  MG  --   --  1.7 1.6*  PHOS  --   --  2.8 3.8   < > = values in this interval not displayed.   Liver Function Tests: Recent Labs  Lab 10/17/19 0442  AST 29  ALT 29  ALKPHOS 62  BILITOT 1.3*  PROT 7.2  ALBUMIN 4.1   Recent Labs  Lab 10/17/19 0442  LIPASE 17   No results for input(s): AMMONIA in the last 168 hours. CBC: Recent Labs  Lab 10/17/19 0442 10/17/19 1930 10/18/19 0531  WBC 12.1* 18.6* 21.6*  NEUTROABS 9.4*  16.8*  --   HGB 13.9 13.0 12.1*  HCT 41.1 37.7* 37.0*  MCV 99.3 100.3* 102.8*  PLT 193 162 152   Cardiac Enzymes: No results for input(s): CKTOTAL, CKMB, CKMBINDEX, TROPONINI in the last 168 hours. BNP: Invalid input(s): POCBNP CBG: Recent Labs  Lab 10/17/19 1427  GLUCAP 184*       IMAGING RESULTS:  Imaging: CT Abdomen Pelvis W Contrast  Result Date: 10/17/2019 CLINICAL DATA:  Left lower quadrant abdominal pain, bowel obstruction suspected EXAM: CT ABDOMEN AND PELVIS WITH CONTRAST TECHNIQUE: Multidetector CT imaging of the abdomen and pelvis was performed using the standard protocol following bolus administration of intravenous contrast. CONTRAST:  124m OMNIPAQUE IOHEXOL 300 MG/ML  SOLN COMPARISON:  None. FINDINGS: Lower chest: Patchy confluent subpleural reticulation and ground-glass opacity throughout both lung bases, unchanged from recent chest CT angiogram study. No acute abnormality at the lung bases. Coronary atherosclerosis. Surgical clips surround the esophagogastric junction. Hepatobiliary: Normal liver size. No liver mass. Normal gallbladder with no radiopaque cholelithiasis. No biliary ductal dilatation. Pancreas: Diffuse fatty infiltration of the pancreas with no pancreatic mass or duct dilation. Spleen: Normal size. No mass. Adrenals/Urinary Tract: Normal adrenals. Normal kidneys with no hydronephrosis and no renal mass. Normal bladder. Stomach/Bowel: Stomach is nondistended with no acute gastric abnormality. There are a few mildly dilated thick-walled small bowel loops in right abdomen measuring up to 3.2 cm diameter with associated mesenteric edema. The proximal and distal small bowel loops are relatively collapsed. No pneumatosis. Stool present in the terminal ileum. Appendix not discretely visualized. Marked sigmoid diverticulosis with no definite large bowel wall thickening or significant pericolonic fat stranding. Vascular/Lymphatic: Atherosclerotic nonaneurysmal  abdominal aorta. Patent portal, splenic, hepatic and renal veins. No pathologically enlarged lymph nodes in the abdomen or pelvis. Reproductive: Mild prostatomegaly with nonspecific internal prostatic calcification. Other: No pneumoperitoneum, ascites or focal fluid collection. Hernia repair mesh in the anterior pelvis. No evidence of a recurrent ventral hernia. Musculoskeletal: No aggressive appearing focal osseous lesions. Marked thoracolumbar spondylosis. IMPRESSION: 1. Several mildly dilated thick-walled small bowel loops in the right abdomen with associated mesenteric edema. Proximal and distal small bowel loops are nondilated. Internal hernia with associated small bowel obstruction cannot be excluded. Differential includes regional enteritis. No free air. No pneumatosis. Surgical consultation suggested. 2. Marked sigmoid diverticulosis with no evidence of acute diverticulitis. 3. Mild prostatomegaly. 4. Nonspecific pulmonary fibrosis at the lung bases. 5. Aortic Atherosclerosis (ICD10-I70.0). Electronically  Signed   By: Ilona Sorrel M.D.   On: 10/17/2019 07:43   '@[email or web address]' @ No results found.   ASSESSMENT AND PLAN    -Multidisciplinary rounds held today  Acute Abdomen due to internal hernia with gangrenous bowel  -surgery on case - appreciate input -abdomen is closed  -follow surgical recommendations -IVF - LR 100cc/h    Transient hypotension  - IVF and Levophed for support if needed   - may need central line if hypotension persists ICU telemetry monitoring   ID -continue IV abx as prescibed -follow up cultures  GI/Nutrition GI PROPHYLAXIS as indicated DIET-->TF's as tolerated Constipation protocol as indicated  ENDO - ICU hypoglycemic\Hyperglycemia protocol -check FSBS per protocol   ELECTROLYTES -follow labs as needed -replace as needed -pharmacy consultation   DVT/GI PRX ordered -SCDs  TRANSFUSIONS AS NEEDED MONITOR FSBS ASSESS the need for LABS as  needed   Critical care provider statement:    Critical care time (minutes):  33   Critical care time was exclusive of:  Separately billable procedures and treating other patients   Critical care was necessary to treat or prevent imminent or life-threatening deterioration of the following conditions:  Gangene of bowel, s/p ex lap, hypotension, multiple comoribid conditions.   Critical care was time spent personally by me on the following activities:  Development of treatment plan with patient or surrogate, discussions with consultants, evaluation of patient's response to treatment, examination of patient, obtaining history from patient or surrogate, ordering and performing treatments and interventions, ordering and review of laboratory studies and re-evaluation of patient's condition.  I assumed direction of critical care for this patient from another provider in my specialty: no    This document was prepared using Dragon voice recognition software and may include unintentional dictation errors.    Ottie Glazier, M.D.  Division of [place]

## 2019-10-18 NOTE — Progress Notes (Signed)
10/18/2019  Subjective: Patient is 1 Day Post-Op s/p exlap and small bowel resection.  He had 60-70 cm of gangrenous small bowel which was resected, and was left open in discontinuity for second look.  Yesterday, he only required Norepi briefly, but has been off pressors otherwise.  He's able to wake up and follows commands.    Vital signs: Temp:  [98 F (36.7 C)-98.7 F (37.1 C)] 98.3 F (36.8 C) (05/30 0800) Pulse Rate:  [60-88] 67 (05/30 0800) Resp:  [0-23] 12 (05/30 0800) BP: (92-134)/(39-76) 102/70 (05/30 0800) SpO2:  [93 %-100 %] 99 % (05/30 0800) FiO2 (%):  [28 %-40 %] 28 % (05/30 0727) Weight:  [84.4 kg] 84.4 kg (05/29 1426)   Intake/Output: 05/29 0701 - 05/30 0700 In: 3715.8 [I.V.:3665.8; IV Piggyback:50] Out: 1055 [Urine:500; Emesis/NG output:50; Drains:475; Blood:30]    Physical Exam: Constitutional: No acute distress Abdomen:  Open abdomen with ABthera wound vac in place, with serosanguinous fluid in cannister.  Labs:  Recent Labs    10/17/19 1930 10/18/19 0531  WBC 18.6* 21.6*  HGB 13.0 12.1*  HCT 37.7* 37.0*  PLT 162 152   Recent Labs    10/17/19 1930 10/18/19 0531  NA 140 140  K 4.5 4.5  CL 108 108  CO2 26 26  GLUCOSE 175* 138*  BUN 21 22  CREATININE 0.77 0.84  CALCIUM 8.1* 8.1*   No results for input(s): LABPROT, INR in the last 72 hours.  Imaging: No results found.  Assessment/Plan: This is a 86 y.o. male s/p exlap and small bowel resection.  --Will take back to OR today for second look exploratory laparotomy, possible small bowel resection, and possible anastomosis and closure.  Discussed with his brother Quantez Schnyder.   Howie Ill, MD [facility]

## 2019-10-18 NOTE — Anesthesia Postprocedure Evaluation (Signed)
Anesthesia Post Note  Patient: [name] [name]  Procedure(s) Performed: EXPLORATORY LAPAROTOMY (N/A Abdomen) APPLICATION OF WOUND VAC (N/A Abdomen) SMALL BOWEL RESECTION (N/A Abdomen)  Patient location during evaluation: SICU Anesthesia Type: General Level of consciousness: sedated Pain management: pain level controlled Vital Signs Assessment: post-procedure vital signs reviewed and stable Respiratory status: patient remains intubated per anesthesia plan Cardiovascular status: stable Postop Assessment: no apparent nausea or vomiting Anesthetic complications: no     Last Vitals:  Vitals:   10/18/19 0400 10/18/19 0500  BP: 93/60 97/64  Pulse: 74 66  Resp: (!) 9 (!) 0  Temp: 36.7 C   SpO2: 99% 100%    Last Pain:  Vitals:   10/18/19 0400  TempSrc: Oral  PainSc:                  [name] [name]

## 2019-10-19 ENCOUNTER — Other Ambulatory Visit: Payer: Self-pay

## 2019-10-19 ENCOUNTER — Inpatient Hospital Stay: Payer: Medicare Other

## 2019-10-19 LAB — CBC
HCT: 31.2 % — ABNORMAL LOW (ref 39.0–52.0)
Hemoglobin: 10.7 g/dL — ABNORMAL LOW (ref 13.0–17.0)
MCH: 34.6 pg — ABNORMAL HIGH (ref 26.0–34.0)
MCHC: 34.3 g/dL (ref 30.0–36.0)
MCV: 101 fL — ABNORMAL HIGH (ref 80.0–100.0)
Platelets: 141 10*3/uL — ABNORMAL LOW (ref 150–400)
RBC: 3.09 MIL/uL — ABNORMAL LOW (ref 4.22–5.81)
RDW: 12.1 % (ref 11.5–15.5)
WBC: 19.1 10*3/uL — ABNORMAL HIGH (ref 4.0–10.5)
nRBC: 0 % (ref 0.0–0.2)

## 2019-10-19 LAB — BASIC METABOLIC PANEL
Anion gap: 8 (ref 5–15)
BUN: 28 mg/dL — ABNORMAL HIGH (ref 8–23)
CO2: 27 mmol/L (ref 22–32)
Calcium: 8 mg/dL — ABNORMAL LOW (ref 8.9–10.3)
Chloride: 105 mmol/L (ref 98–111)
Creatinine, Ser: 0.71 mg/dL (ref 0.61–1.24)
GFR calc Af Amer: >60 mL/min (ref >60)
GFR calc non Af Amer: >60 mL/min (ref >60)
Glucose, Bld: 127 mg/dL — ABNORMAL HIGH (ref 70–99)
Potassium: 4.2 mmol/L (ref 3.5–5.1)
Sodium: 140 mmol/L (ref 135–145)

## 2019-10-19 LAB — PHOSPHORUS: Phosphorus: 3.2 mg/dL (ref 2.5–4.6)

## 2019-10-19 LAB — MAGNESIUM
Magnesium: 2.1 mg/dL (ref 1.7–2.4)
Magnesium: 2.2 mg/dL (ref 1.7–2.4)

## 2019-10-19 NOTE — Progress Notes (Signed)
Pharmacy Antibiotic Note  [name] [name] is a 86 y.o. male admitted on 10/17/2019 with IAI.  Pharmacy has been consulted for Zosyn dosing.  Internal hernia with gangrenous small bowel s/p exploratory laparotomy, small bowel resection of 70 cm, and placement of wound vac on 5/29.   Day 3 IV abx  Plan: Continue Zosyn 3.375g IV q8h (4 hour infusion).  Height: 5\' 9"  (175.3 cm) Weight: 84.4 kg (186 lb 1.1 oz) IBW/kg (Calculated) : 70.7  Temp (24hrs), Avg:98.2 F (36.8 C), Min:97.9 F (36.6 C), Max:98.4 F (36.9 C)  Recent Labs  Lab 10/17/19 0442 10/17/19 1930 10/18/19 0531 10/19/19 0408  WBC 12.1* 18.6* 21.6* 19.1*  CREATININE 0.84 0.77 0.84 0.71    Estimated Creatinine Clearance: 70 mL/min (by C-G formula based on SCr of 0.71 mg/dL).    Allergies  Allergen Reactions  . Other Other (See Comments)    Tetanus shot  . Tetanus Toxoid, Adsorbed     Tetanus shot  . Tetanus Toxoids     Tetanus shot    Antimicrobials this admission: Zosyn 5/29 >>  Dose adjustments this admission:   Microbiology results:  Thank you for allowing pharmacy to be a part of this patient's care.  6/29, PharmD, BCPS Clinical Pharmacist 10/19/2019 10:46 AM

## 2019-10-19 NOTE — Progress Notes (Signed)
10/19/2019  Subjective: Patient is 1 Day Post-Op s/p takeback to OR, small bowel resection with anastomosis and abdominal closure.  No acute events overnight.  Patient was extubated in ICU after surgery.  He remains off pressors.  He reports some mild stinging in his incision but otherwise no significant pain.  WBC somewhat improved to 19.1.  Reports he's having flatus.  Vital signs: Temp:  [98.1 F (36.7 C)-98.4 F (36.9 C)] 98.1 F (36.7 C) (05/30 1945) Pulse Rate:  [43-138] 72 (05/31 0200) Resp:  [7-18] 8 (05/31 0200) BP: (104-141)/(54-87) 111/54 (05/31 0200) SpO2:  [90 %-99 %] 98 % (05/31 0200) FiO2 (%):  [28 %] 28 % (05/30 1315)   Intake/Output: 05/30 0701 - 05/31 0700 In: 2664 [I.V.:2467.2; IV Piggyback:196.9] Out: 1335 [Urine:730; Emesis/NG output:150; Drains:405; Blood:50]    Physical Exam: Constitutional:  No acute distress Abdomen:  Soft, non-distended, appropriately tender to palpation.  Midline incision with prevena vac in place, with good seal.  JP drain with serosanguinous fluid.  Labs:  Recent Labs    10/18/19 0531 10/19/19 0408  WBC 21.6* 19.1*  HGB 12.1* 10.7*  HCT 37.0* 31.2*  PLT 152 141*   Recent Labs    10/18/19 0531 10/19/19 0408  NA 140 140  K 4.5 4.2  CL 108 105  CO2 26 27  GLUCOSE 138* 127*  BUN 22 28*  CREATININE 0.84 0.71  CALCIUM 8.1* 8.0*   No results for input(s): LABPROT, INR in the last 72 hours.  Imaging: No results found.  Assessment/Plan: This is a 86 y.o. male s/p exlap, small bowel resection  --Will do NG clamping trial today.  If low residual, would be ok to d/c NG tube and start ice chips and sips. --D/C foley catheter today --continue IV Zosyn --OOB, ambulate, PT consult placed --OK to transfer to floor.   Howie Ill, MD [facility]

## 2019-10-19 NOTE — Anesthesia Postprocedure Evaluation (Signed)
Anesthesia Post Note  Patient: [name] [name]  Procedure(s) Performed: EXPLORATORY LAPAROTOMY (N/A Abdomen) APPLICATION OF WOUND VAC (Abdomen) SMALL BOWEL RESECTION (Abdomen)  Patient location during evaluation: ICU Anesthesia Type: General Level of consciousness: awake and alert Pain management: pain level controlled Vital Signs Assessment: post-procedure vital signs reviewed and stable Respiratory status: spontaneous breathing, nonlabored ventilation and respiratory function stable Cardiovascular status: stable and tachycardic Postop Assessment: no apparent nausea or vomiting Anesthetic complications: no     Last Vitals:  Vitals:   10/19/19 0800 10/19/19 1200  BP: 116/67   Pulse: 74   Resp: 11   Temp: 36.6 C 36.6 C  SpO2: 91%     Last Pain:  Vitals:   10/19/19 1200  TempSrc: Oral  PainSc:                  Karleen Hampshire

## 2019-10-19 NOTE — Progress Notes (Signed)
PT Cancellation Note  Patient Details Name: RUBLE PUMPHREY MRN: [id number] DOB: 08/19/1933   Cancelled Treatment:    Reason Eval/Treat Not Completed: Patient at procedure or test/unavailable.  Ex lap for SBO today, will assess tomorrow.   Ivar Drape 10/19/2019, 4:10 PM   Samul Dada, PT MS Acute Rehab Dept. Number: [facility] [id number] and [facility] [phone]

## 2019-10-19 NOTE — Progress Notes (Signed)
CRITICAL CARE PROGRESS NOTE    Name: [name] [name] MRN: [id number] DOB: 1933/08/17     LOS: 2   SUBJECTIVE FINDINGS & SIGNIFICANT EVENTS   Patient description:  86 yo male hx of CAD, dyslipidemia, came in due to 2d abd pain and found to have dilated thick walled sm bowel loops consistent with bowel edema, had ex lap with finding of 60-70cm gangrenous bowel s/p resection and brought to MICU with open mid abdomen surgical site dressed w/wound vac. Patient required Neosynephrine pushes during case due to hypotension.   Lines / Drains: PIVx2   Cultures / Sepsis markers: none  Antibiotics: Zosyn    Protocols / Consultants: Surgery , PCCM  10/18/19- patient post op today was weaned down on mechanical ventilation and extubated without complications.  10/19/19- patient is clinically improved, optimizing for transfer out of MICU  PAST MEDICAL HISTORY   Past Medical History:  Diagnosis Date  . Bradycardia    relative, precluding up titration of his beta-blocker  . CAD (coronary artery disease)   . HLD (hyperlipidemia)   . Non-ST elevated myocardial infarction (non-STEMI) (HCC)    2/07: LHC with occluded OM, LAD 20% + 40%; normal LVF => PTCA of OM with wire passage alone   . PVC's (premature ventricular contractions)   . Syncope    hx     SURGICAL HISTORY   Past Surgical History:  Procedure Laterality Date  . APPLICATION OF WOUND VAC N/A 10/17/2019   Procedure: APPLICATION OF WOUND VAC;  Surgeon: Henrene Dodge, MD;  Location: [facility] ORS;  Service: General;  Laterality: N/A;  [id number]   . APPLICATION OF WOUND VAC  10/18/2019   Procedure: APPLICATION OF WOUND VAC;  Surgeon: Henrene Dodge, MD;  Location: [facility] ORS;  Service: General;;  . BOWEL RESECTION N/A 10/17/2019   Procedure: SMALL BOWEL RESECTION;   Surgeon: Henrene Dodge, MD;  Location: [facility] ORS;  Service: General;  Laterality: N/A;  . BOWEL RESECTION  10/18/2019   Procedure: SMALL BOWEL RESECTION;  Surgeon: Henrene Dodge, MD;  Location: [facility] ORS;  Service: General;;  . CARDIAC CATHETERIZATION    . coronary arteriogram    . EXPLORATORY LAPAROTOMY  1970s   for bleeding peptic ulcer  . LAPAROTOMY N/A 10/17/2019   Procedure: EXPLORATORY LAPAROTOMY;  Surgeon: Henrene Dodge, MD;  Location: [facility] ORS;  Service: General;  Laterality: N/A;  . LAPAROTOMY N/A 10/18/2019   Procedure: EXPLORATORY LAPAROTOMY;  Surgeon: Henrene Dodge, MD;  Location: [facility] ORS;  Service: General;  Laterality: N/A;  . left ventriculogram    . reperfusion of obtuse marginal with a wife alone       FAMILY HISTORY   Family History  Problem Relation Age of Onset  . Heart attack Mother   . Heart attack Father      SOCIAL HISTORY   Social History   Tobacco Use  . Smoking status: Never Smoker  . Smokeless tobacco: Former Neurosurgeon    Types: Chew  Substance Use Topics  . Alcohol use: No    Alcohol/week: 0.0 standard drinks  . Drug use: No     MEDICATIONS   Current Medication:  Current Facility-Administered Medications:  .  0.9 %  sodium chloride infusion, 250 mL, Intravenous, Continuous, Piscoya, Jose, MD .  Chlorhexidine Gluconate Cloth 2 % PADS 6 each, 6 each, Topical, Daily, Piscoya, Jose, MD, 6 each at 10/19/19 1529 .  enoxaparin (LOVENOX) injection 40 mg, 40 mg, Subcutaneous, Q24H, Piscoya, Jose, MD, 40 mg at 10/18/19 2251 .  HYDROmorphone (DILAUDID) injection 0.5 mg, 0.5 mg, Intravenous, Q4H PRN, Piscoya, Jose, MD .  ketorolac (TORADOL) 30 MG/ML injection 15 mg, 15 mg, Intravenous, Q6H, Piscoya, Jose, MD, 15 mg at 10/19/19 1148 .  lactated ringers infusion, , Intravenous, Continuous, Piscoya, Jose, MD, Last Rate: 100 mL/hr at 10/19/19 1600, Rate Verify at 10/19/19 1600 .  metoprolol tartrate (LOPRESSOR) injection 5 mg, 5 mg, Intravenous, Q6H, Piscoya,  Jose, MD, 5 mg at 10/19/19 0546 .  ondansetron (ZOFRAN-ODT) disintegrating tablet 4 mg, 4 mg, Oral, Q6H PRN **OR** ondansetron (ZOFRAN) injection 4 mg, 4 mg, Intravenous, Q6H PRN, Piscoya, Jose, MD, 4 mg at 10/17/19 1211 .  pantoprazole (PROTONIX) injection 40 mg, 40 mg, Intravenous, QHS, Piscoya, Jose, MD, 40 mg at 10/18/19 2133 .  piperacillin-tazobactam (ZOSYN) IVPB 3.375 g, 3.375 g, Intravenous, Q8H, Piscoya, Jose, MD, Last Rate: 12.5 mL/hr at 10/19/19 1532, 3.375 g at 10/19/19 1532 .  polyethylene glycol (MIRALAX / GLYCOLAX) packet 17 g, 17 g, Oral, Daily PRN, Piscoya, Jose, MD    ALLERGIES   Other; Tetanus toxoid, adsorbed; and Tetanus toxoids    REVIEW OF SYSTEMS    Unable to obtain due to sedation on MV  PHYSICAL EXAMINATION   Vital Signs: Temp:  [97.8 F (36.6 C)-98.1 F (36.7 C)] 97.8 F (36.6 C) (05/31 1200) Pulse Rate:  [43-138] 76 (05/31 1500) Resp:  [7-18] 10 (05/31 1600) BP: (104-145)/(54-87) 114/59 (05/31 1600) SpO2:  [87 %-99 %] 98 % (05/31 1600)  GENERAL:age appropriate HEAD: Normocephalic, atraumatic.  EYES: Pupils equal, round, reactive to light.  No scleral icterus.  MOUTH: Moist mucosal membrane. +OGT NECK: Supple. No thyromegaly. No nodules. No JVD.  PULMONARY: clear to auscultation she is white counts are CARDIOVASCULAR: S1 and S2. Regular rate and rhythm. No murmurs, rubs, or gallops.  GASTROINTESTINAL: Soft, nontender, non-distended. No masses. Positive bowel sounds. No hepatosplenomegaly.  MUSCULOSKELETAL: No swelling, clubbing, or edema.  NEUROLOGIC: Mild distress due to acute illness SKIN:intact,warm,dry   PERTINENT DATA     Infusions: . sodium chloride    . lactated ringers 100 mL/hr at 10/19/19 1600  . piperacillin-tazobactam (ZOSYN)  IV 3.375 g (10/19/19 1532)   Scheduled Medications: . Chlorhexidine Gluconate Cloth  6 each Topical Daily  . enoxaparin (LOVENOX) injection  40 mg Subcutaneous Q24H  . ketorolac  15 mg Intravenous  Q6H  . metoprolol tartrate  5 mg Intravenous Q6H  . pantoprazole (PROTONIX) IV  40 mg Intravenous QHS   PRN Medications: HYDROmorphone (DILAUDID) injection, ondansetron **OR** ondansetron (ZOFRAN) IV, polyethylene glycol Hemodynamic parameters:   Intake/Output: 05/30 0701 - 05/31 0700 In: 2664 [I.V.:2467.2; IV Piggyback:196.9] Out: 1335 [Urine:730; Emesis/NG output:150; Drains:405; Blood:50]  Ventilator  Settings:     LAB RESULTS:  Basic Metabolic Panel: Recent Labs  Lab 10/17/19 0442 10/17/19 0442 10/17/19 1930 10/17/19 1930 10/18/19 0531 10/19/19 0003 10/19/19 0408  NA 140  --  140  --  140  --  140  K 4.0   < > 4.5   < > 4.5  --  4.2  CL 101  --  108  --  108  --  105  CO2 26  --  26  --  26  --  27  GLUCOSE 175*  --  175*  --  138*  --  127*  BUN 15  --  21  --  22  --  28*  CREATININE 0.84  --  0.77  --  0.84  --  0.71  CALCIUM 9.6  --  8.1*  --  8.1*  --  8.0*  MG  --   --  1.7  --  1.6* 2.2 2.1  PHOS  --   --  2.8  --  3.8  --  3.2   < > = values in this interval not displayed.   Liver Function Tests: Recent Labs  Lab 10/17/19 0442  AST 29  ALT 29  ALKPHOS 62  BILITOT 1.3*  PROT 7.2  ALBUMIN 4.1   Recent Labs  Lab 10/17/19 0442  LIPASE 17   No results for input(s): AMMONIA in the last 168 hours. CBC: Recent Labs  Lab 10/17/19 0442 10/17/19 1930 10/18/19 0531 10/19/19 0408  WBC 12.1* 18.6* 21.6* 19.1*  NEUTROABS 9.4* 16.8*  --   --   HGB 13.9 13.0 12.1* 10.7*  HCT 41.1 37.7* 37.0* 31.2*  MCV 99.3 100.3* 102.8* 101.0*  PLT 193 162 152 141*   Cardiac Enzymes: No results for input(s): CKTOTAL, CKMB, CKMBINDEX, TROPONINI in the last 168 hours. BNP: Invalid input(s): POCBNP CBG: Recent Labs  Lab 10/17/19 1427  GLUCAP 184*       IMAGING RESULTS:  Imaging: No results found. @PROBHOSP @ No results found.   ASSESSMENT AND PLAN    -Multidisciplinary rounds held today  Acute Abdomen due to internal hernia with gangrenous bowel   -surgery on case - appreciate input -abdomen is closed  -follow surgical recommendations -IVF - LR 100cc/h -CXR today    Transient hypotension  - IVF and Levophed for support if needed   - may need central line if hypotension persists ICU telemetry monitoring   ID -continue IV abx as prescibed -follow up cultures  GI/Nutrition GI PROPHYLAXIS as indicated DIET-->TF's as tolerated Constipation protocol as indicated  ENDO - ICU hypoglycemic\Hyperglycemia protocol -check FSBS per protocol   ELECTROLYTES -follow labs as needed -replace as needed -pharmacy consultation   DVT/GI PRX ordered -SCDs  TRANSFUSIONS AS NEEDED MONITOR FSBS ASSESS the need for LABS as needed   Critical care provider statement:    Critical care time (minutes):  33   Critical care time was exclusive of:  Separately billable procedures and treating other patients   Critical care was necessary to treat or prevent imminent or life-threatening deterioration of the following conditions:  Gangene of bowel, s/p ex lap, hypotension, multiple comoribid conditions.   Critical care was time spent personally by me on the following activities:  Development of treatment plan with patient or surrogate, discussions with consultants, evaluation of patient's response to treatment, examination of patient, obtaining history from patient or surrogate, ordering and performing treatments and interventions, ordering and review of laboratory studies and re-evaluation of patient's condition.  I assumed direction of critical care for this patient from another provider in my specialty: no    This document was prepared using Dragon voice recognition software and may include unintentional dictation errors.    Ottie Glazier, M.D.  Division of [place]

## 2019-10-20 ENCOUNTER — Inpatient Hospital Stay: Payer: Medicare Other

## 2019-10-20 DIAGNOSIS — I4891 Unspecified atrial fibrillation: Secondary | ICD-10-CM

## 2019-10-20 DIAGNOSIS — I25119 Atherosclerotic heart disease of native coronary artery with unspecified angina pectoris: Secondary | ICD-10-CM

## 2019-10-20 DIAGNOSIS — E785 Hyperlipidemia, unspecified: Secondary | ICD-10-CM

## 2019-10-20 DIAGNOSIS — I480 Paroxysmal atrial fibrillation: Secondary | ICD-10-CM

## 2019-10-20 DIAGNOSIS — I10 Essential (primary) hypertension: Secondary | ICD-10-CM

## 2019-10-20 DIAGNOSIS — Z9889 Other specified postprocedural states: Secondary | ICD-10-CM

## 2019-10-20 DIAGNOSIS — I2510 Atherosclerotic heart disease of native coronary artery without angina pectoris: Secondary | ICD-10-CM

## 2019-10-20 DIAGNOSIS — K461 Unspecified abdominal hernia with gangrene: Principal | ICD-10-CM

## 2019-10-20 LAB — BASIC METABOLIC PANEL
Anion gap: 6 (ref 5–15)
BUN: 24 mg/dL — ABNORMAL HIGH (ref 8–23)
CO2: 29 mmol/L (ref 22–32)
Calcium: 7.9 mg/dL — ABNORMAL LOW (ref 8.9–10.3)
Chloride: 108 mmol/L (ref 98–111)
Creatinine, Ser: 0.82 mg/dL (ref 0.61–1.24)
GFR calc Af Amer: >60 mL/min (ref >60)
GFR calc non Af Amer: >60 mL/min (ref >60)
Glucose, Bld: 73 mg/dL (ref 70–99)
Potassium: 4.2 mmol/L (ref 3.5–5.1)
Sodium: 143 mmol/L (ref 135–145)

## 2019-10-20 LAB — TSH: TSH: 0.719 u[IU]/mL (ref 0.350–4.500)

## 2019-10-20 LAB — CBC
HCT: 31.3 % — ABNORMAL LOW (ref 39.0–52.0)
Hemoglobin: 10.3 g/dL — ABNORMAL LOW (ref 13.0–17.0)
MCH: 34 pg (ref 26.0–34.0)
MCHC: 32.9 g/dL (ref 30.0–36.0)
MCV: 103.3 fL — ABNORMAL HIGH (ref 80.0–100.0)
Platelets: 132 10*3/uL — ABNORMAL LOW (ref 150–400)
RBC: 3.03 MIL/uL — ABNORMAL LOW (ref 4.22–5.81)
RDW: 11.9 % (ref 11.5–15.5)
WBC: 10.7 10*3/uL — ABNORMAL HIGH (ref 4.0–10.5)
nRBC: 0 % (ref 0.0–0.2)

## 2019-10-20 LAB — BRAIN NATRIURETIC PEPTIDE: B Natriuretic Peptide: 459.3 pg/mL — ABNORMAL HIGH (ref 0.0–100.0)

## 2019-10-20 LAB — PROTIME-INR
INR: 1.1 (ref 0.8–1.2)
Prothrombin Time: 13.6 seconds (ref 11.4–15.2)

## 2019-10-20 LAB — MAGNESIUM: Magnesium: 1.9 mg/dL (ref 1.7–2.4)

## 2019-10-20 LAB — TROPONIN I (HIGH SENSITIVITY)
Troponin I (High Sensitivity): 13 ng/L (ref <18)
Troponin I (High Sensitivity): 12 ng/L (ref <18)

## 2019-10-20 LAB — PHOSPHORUS: Phosphorus: 3.1 mg/dL (ref 2.5–4.6)

## 2019-10-20 MED ORDER — METOPROLOL SUCCINATE ER 25 MG PO TB24
25.0000 mg | ORAL_TABLET | Freq: Every day | ORAL | Status: DC
  Administered 2019-10-20: 25 mg via ORAL
  Filled 2019-10-20: qty 1

## 2019-10-20 MED ORDER — METOPROLOL TARTRATE 5 MG/5ML IV SOLN
5.0000 mg | INTRAVENOUS | Status: DC | PRN
  Administered 2019-10-20: 5 mg via INTRAVENOUS
  Filled 2019-10-20: qty 5

## 2019-10-20 MED ORDER — METOPROLOL TARTRATE 25 MG PO TABS
12.5000 mg | ORAL_TABLET | Freq: Two times a day (BID) | ORAL | Status: DC
  Administered 2019-10-20 – 2019-10-23 (×7): 12.5 mg via ORAL
  Filled 2019-10-20 (×7): qty 1

## 2019-10-20 MED ORDER — AMIODARONE HCL IN DEXTROSE 360-4.14 MG/200ML-% IV SOLN
60.0000 mg/h | INTRAVENOUS | Status: AC
  Administered 2019-10-20: 60 mg/h via INTRAVENOUS
  Filled 2019-10-20: qty 200

## 2019-10-20 MED ORDER — DILTIAZEM HCL 25 MG/5ML IV SOLN
10.0000 mg | Freq: Once | INTRAVENOUS | Status: DC
  Filled 2019-10-20: qty 5

## 2019-10-20 MED ORDER — SODIUM CHLORIDE 0.9 % IV BOLUS
500.0000 mL | Freq: Once | INTRAVENOUS | Status: AC
  Administered 2019-10-20: 500 mL via INTRAVENOUS

## 2019-10-20 MED ORDER — CHLORHEXIDINE GLUCONATE CLOTH 2 % EX PADS
6.0000 | MEDICATED_PAD | Freq: Every day | CUTANEOUS | Status: DC
  Administered 2019-10-20 – 2019-10-22 (×3): 6 via TOPICAL

## 2019-10-20 MED ORDER — APIXABAN 5 MG PO TABS
5.0000 mg | ORAL_TABLET | Freq: Two times a day (BID) | ORAL | Status: DC
  Administered 2019-10-20 – 2019-10-23 (×7): 5 mg via ORAL
  Filled 2019-10-20 (×7): qty 1

## 2019-10-20 MED ORDER — AMIODARONE HCL IN DEXTROSE 360-4.14 MG/200ML-% IV SOLN
30.0000 mg/h | INTRAVENOUS | Status: DC
  Administered 2019-10-21 – 2019-10-23 (×5): 30 mg/h via INTRAVENOUS
  Filled 2019-10-20 (×5): qty 200

## 2019-10-20 NOTE — Progress Notes (Signed)
Patient ID: [name] [name], male   DOB: 1934/03/28, 86 y.o.   MRN: [id number] [facility] PROGRESS NOTE  [name] [name] XHB:[id number] DOB: Nov 30, 1933 DOA: 10/17/2019 PCP: Baxter Hire, MD  HPI/Subjective: Patient states that he has been feeling okay.  Last night went into rapid atrial fibrillation and then converted over to normal sinus rhythm.  Late this morning it happened again.  Objective: Vitals:   10/20/19 1021 10/20/19 1057  BP: 102/62 96/63  Pulse: 70 84  Resp: 18 18  Temp: 97.9 F (36.6 C) 97.7 F (36.5 C)  SpO2: 95% 93%    Intake/Output Summary (Last 24 hours) at 10/20/2019 1257 Last data filed at 10/20/2019 0903 Gross per 24 hour  Intake 613.99 ml  Output 855 ml  Net -241.01 ml   Filed Weights   10/17/19 0435 10/17/19 1426  Weight: 78 kg 84.4 kg    ROS: Review of Systems  Constitutional: Negative for fever.  Eyes: Negative for blurred vision.  Respiratory: Negative for cough and shortness of breath.   Cardiovascular: Negative for chest pain.  Gastrointestinal: Negative for abdominal pain, nausea and vomiting.  Genitourinary: Negative for dysuria.  Musculoskeletal: Negative for joint pain.  Neurological: Negative for dizziness.   Exam: Physical Exam  HENT:  Nose: No mucosal edema.  Mouth/Throat: No oropharyngeal exudate or posterior oropharyngeal edema.  Eyes: Conjunctivae and lids are normal.  Neck: Carotid bruit is not present.  Cardiovascular: S1 normal and S2 normal. Exam reveals no gallop.  No murmur heard. Respiratory: No respiratory distress. He has no wheezes. He has no rhonchi. He has no rales.  GI: Soft. Bowel sounds are normal. There is no abdominal tenderness.  Musculoskeletal:     Right ankle: No swelling.     Left ankle: No swelling.  Lymphadenopathy:    He has no cervical adenopathy.  Neurological: He is alert. No cranial nerve deficit.  Skin: Skin is warm. No rash noted. Nails show no clubbing.  Psychiatric: He has a normal  mood and affect.      Data Reviewed: Basic Metabolic Panel: Recent Labs  Lab 10/17/19 0442 10/17/19 1930 10/18/19 0531 10/19/19 0003 10/19/19 0408 10/20/19 0339  NA 140 140 140  --  140 143  K 4.0 4.5 4.5  --  4.2 4.2  CL 101 108 108  --  105 108  CO2 26 26 26   --  27 29  GLUCOSE 175* 175* 138*  --  127* 73  BUN 15 21 22   --  28* 24*  CREATININE 0.84 0.77 0.84  --  0.71 0.82  CALCIUM 9.6 8.1* 8.1*  --  8.0* 7.9*  MG  --  1.7 1.6* 2.2 2.1 1.9  PHOS  --  2.8 3.8  --  3.2 3.1   Liver Function Tests: Recent Labs  Lab 10/17/19 0442  AST 29  ALT 29  ALKPHOS 62  BILITOT 1.3*  PROT 7.2  ALBUMIN 4.1   Recent Labs  Lab 10/17/19 0442  LIPASE 17   CBC: Recent Labs  Lab 10/17/19 0442 10/17/19 1930 10/18/19 0531 10/19/19 0408 10/20/19 0339  WBC 12.1* 18.6* 21.6* 19.1* 10.7*  NEUTROABS 9.4* 16.8*  --   --   --   HGB 13.9 13.0 12.1* 10.7* 10.3*  HCT 41.1 37.7* 37.0* 31.2* 31.3*  MCV 99.3 100.3* 102.8* 101.0* 103.3*  PLT 193 162 152 141* 132*   BNP (last 3 results) Recent Labs    09/21/19 0516 10/20/19 0339  BNP 58.0 459.3*  CBG: Recent Labs  Lab 10/17/19 1427  GLUCAP 184*    Recent Results (from the past 240 hour(s))  SARS Coronavirus 2 by RT PCR (hospital order, performed in [facility] hospital lab) Nasopharyngeal Nasopharyngeal Swab     Status: None   Collection Time: 10/17/19  6:29 AM   Specimen: Nasopharyngeal Swab  Result Value Ref Range Status   SARS Coronavirus 2 NEGATIVE NEGATIVE Final    Comment: (NOTE) SARS-CoV-2 target nucleic acids are NOT DETECTED. The SARS-CoV-2 RNA is generally detectable in upper and lower respiratory specimens during the acute phase of infection. The lowest concentration of SARS-CoV-2 viral copies this assay can detect is 250 copies / mL. A negative result does not preclude SARS-CoV-2 infection and should not be used as the sole basis for treatment or other patient management decisions.  A negative result may  occur with improper specimen collection / handling, submission of specimen other than nasopharyngeal swab, presence of viral mutation(s) within the areas targeted by this assay, and inadequate number of viral copies (<250 copies / mL). A negative result must be combined with clinical observations, patient history, and epidemiological information. Fact Sheet for Patients:   BoilerBrush.com.cy Fact Sheet for Healthcare Providers: https://pope.com/ This test is not yet approved or cleared  by the [place] FDA and has been authorized for detection and/or diagnosis of SARS-CoV-2 by FDA under an Emergency Use Authorization (EUA).  This EUA will remain in effect (meaning this test can be used) for the duration of the COVID-19 declaration under Section 564(b)(1) of the Act, 21 U.S.C. section 360bbb-3(b)(1), unless the authorization is terminated or revoked sooner. Performed at [facility], [address] [place], [place] [address]   MRSA PCR Screening     Status: None   Collection Time: 10/17/19  2:32 PM   Specimen: Nasopharyngeal  Result Value Ref Range Status   MRSA by PCR NEGATIVE NEGATIVE Final    Comment:        The GeneXpert MRSA Assay (FDA approved for NASAL specimens only), is one component of a comprehensive MRSA colonization surveillance program. It is not intended to diagnose MRSA infection nor to guide or monitor treatment for MRSA infections. Performed at [facility], [address], [place], [place] [address]      Studies: [facility] Chest [place] 1 View  Result Date: 10/20/2019 CLINICAL DATA:  Arrhythmia. Coronary artery disease. Aortic stenosis. EXAM: PORTABLE CHEST 1 VIEW COMPARISON:  10/19/2019.  09/21/2019. FINDINGS: Mediastinum and hilar structures are normal. Stable cardiomegaly. No pulmonary venous congestion. Stable bilateral interstitial prominence. Chronic interstitial lung disease could present  in this fashion. Active interstitial process including pneumonitis/interstitial edema cannot be excluded. No pleural effusion or pneumothorax. Surgical clips upper abdomen. IMPRESSION: 1.  Stable cardiomegaly.  No pulmonary venous congestion. 2. Stable bilateral interstitial prominence. Chronic interstitial lung disease could present this fashion. An active interstitial process including pneumonitis/interstitial edema cannot be excluded. Similar findings noted on prior exams. Electronically Signed   By: Maisie Fus  Register   On: 10/20/2019 05:48   DG Chest Port 1 View  Result Date: 10/19/2019 CLINICAL DATA:  Follow-up of abnormal chest x-ray on 09/21/2019 EXAM: PORTABLE CHEST 1 VIEW COMPARISON:  Chest x-rays dated 09/21/2019 and 07/07/2005 and chest CT dated 09/21/2019 FINDINGS: There is chronic accentuation of the interstitial markings peripherally in both lungs, worse on the left than the right. This is demonstrated to be chronic interstitial disease on the prior chest CT dated 09/21/2019. No acute infiltrates or effusions. Heart size  and pulmonary vascularity are normal. No significant bone abnormality. IMPRESSION: 1. No acute abnormality. 2. Chronic interstitial lung disease. The appearance is similar to the recent prior exams. Electronically Signed   By: Francene Boyers M.D.   On: 10/19/2019 17:51    Scheduled Meds: . apixaban  5 mg Oral BID  . ketorolac  15 mg Intravenous Q6H  . metoprolol tartrate  12.5 mg Oral BID  . pantoprazole (PROTONIX) IV  40 mg Intravenous QHS   Continuous Infusions: . sodium chloride    . amiodarone    . amiodarone    . piperacillin-tazobactam (ZOSYN)  IV 3.375 g (10/20/19 0622)    Assessment/Plan:  1. Paroxysmal atrial fibrillation with rapid ventricular rate.  I did give Toprol-XL this morning but then went into rapid rate after that so I did give IV metoprolol and shorter acting twice daily dosing metoprolol.  Asked cardiology to see the patient and they started  amiodarone drip which will have to be done on the progressive care unit so the patient will be transferred over to the progressive care unit.  Cardiology put on Eliquis. 2. Gangrenous bowel status post bowel resection and take back to the OR.  General surgery following on clear liquid diet. 3. History of CAD.  On metoprolol. 4. Hyperlipidemia hold statin       Code Status:     Code Status Orders  (From admission, onward)         Start     Ordered   10/17/19 1017  Full code  Continuous     10/17/19 1019        Code Status History    Date Active Date Inactive Code Status Order ID Comments User Context   09/21/2019 0814 09/21/2019 2222 Full Code [id number]  Lucile Shutters, MD ED   Advance Care Planning Activity     Family Communication: Spoke with sister on the phone Disposition Plan: Status is: Inpatient   Dispo: The patient is from: Home              Anticipated d/c is to: Home              Anticipated d/c date is: 3 to 5 days depending on clinical course.  Disposition as per surgeon              Patient currently being treated for paroxysmal atrial fibrillation and status post surgery for gangrenous male  Consultants:  General surgery  Antibiotics:  Zosyn  Time spent: 32 minutes, case discussed with daughter and cardiology.  Helmut Hennon [employer]  [facility] [employer]

## 2019-10-20 NOTE — Progress Notes (Signed)
10/20/2019  Subjective: Patient is 2 Days Post-Op s/p takeback to OR, small bowel resection with anastomosis and abdominal closure.  He was transferred to the floor from the ICU yesterday evening.  At about 230 this morning, he entered atrial fibrillation with RVR.  Hospital medicine was consulted.  He was managed with diltiazem and reverted to normal sinus rhythm.  He did have another episode of atrial fibrillation earlier this morning, prior to my rounds.  This again resolved.  He does have a history of intermittent A. fib, per his report.  Looking at his home medications, he is only on metoprolol.  He is followed by Dr. Dietrich Pates, cardiology, as an outpatient.  Hospital medicine is consulting cardiology for inpatient management.  His white blood cell count continues to improve, and is almost normal.  He is passing flatus.  Vital signs: Temp:  [97.7 F (36.5 C)-98.7 F (37.1 C)] 97.7 F (36.5 C) (06/01 1057) Pulse Rate:  [68-84] 84 (06/01 1057) Resp:  [10-20] 18 (06/01 1057) BP: (96-145)/(45-78) 96/63 (06/01 1057) SpO2:  [91 %-98 %] 93 % (06/01 1057)   Intake/Output: 05/31 0701 - 06/01 0700 In: 1780.4 [I.V.:1699.7; IV Piggyback:80.7] Out: 705 [Urine:625; Drains:80]    Physical Exam: Constitutional:  No acute distress Abdomen:  Soft, non-distended, appropriately tender to palpation.  Midline incision with prevena vac in place, with good seal.  JP drain with serosanguinous fluid.  Labs:  Recent Labs    10/19/19 0408 10/20/19 0339  WBC 19.1* 10.7*  HGB 10.7* 10.3*  HCT 31.2* 31.3*  PLT 141* 132*   Recent Labs    10/19/19 0408 10/20/19 0339  NA 140 143  K 4.2 4.2  CL 105 108  CO2 27 29  GLUCOSE 127* 73  BUN 28* 24*  CREATININE 0.71 0.82  CALCIUM 8.0* 7.9*   Recent Labs    10/20/19 0339  LABPROT 13.6  INR 1.1    Imaging: DG Chest Port 1 View  Result Date: 10/20/2019 CLINICAL DATA:  Arrhythmia. Coronary artery disease. Aortic stenosis. EXAM: PORTABLE CHEST 1 VIEW  COMPARISON:  10/19/2019.  09/21/2019. FINDINGS: Mediastinum and hilar structures are normal. Stable cardiomegaly. No pulmonary venous congestion. Stable bilateral interstitial prominence. Chronic interstitial lung disease could present in this fashion. Active interstitial process including pneumonitis/interstitial edema cannot be excluded. No pleural effusion or pneumothorax. Surgical clips upper abdomen. IMPRESSION: 1.  Stable cardiomegaly.  No pulmonary venous congestion. 2. Stable bilateral interstitial prominence. Chronic interstitial lung disease could present this fashion. An active interstitial process including pneumonitis/interstitial edema cannot be excluded. Similar findings noted on prior exams. Electronically Signed   By: Maisie Fus  Register   On: 10/20/2019 05:48   DG Chest Port 1 View  Result Date: 10/19/2019 CLINICAL DATA:  Follow-up of abnormal chest x-ray on 09/21/2019 EXAM: PORTABLE CHEST 1 VIEW COMPARISON:  Chest x-rays dated 09/21/2019 and 07/07/2005 and chest CT dated 09/21/2019 FINDINGS: There is chronic accentuation of the interstitial markings peripherally in both lungs, worse on the left than the right. This is demonstrated to be chronic interstitial disease on the prior chest CT dated 09/21/2019. No acute infiltrates or effusions. Heart size and pulmonary vascularity are normal. No significant bone abnormality. IMPRESSION: 1. No acute abnormality. 2. Chronic interstitial lung disease. The appearance is similar to the recent prior exams. Electronically Signed   By: Francene Boyers M.D.   On: 10/19/2019 17:51    Assessment/Plan: This is a 86 y.o. male s/p exlap, small bowel resection  --Will initiate clear liquids. --Molli Knock  from surgical perspective to initiate anticoagulation for paroxysmal atrial fibrillation --continue IV Zosyn --OOB, ambulate, PT consult placed

## 2019-10-20 NOTE — Progress Notes (Signed)
Pt is being transfer to ICU. Report given to [facility].

## 2019-10-20 NOTE — Care Management Important Message (Signed)
Important Message  Patient Details  Name: [name] [name] MRN: [id number] Date of Birth: 04-09-1934   Medicare Important Message Given:  Yes  Initial Medicare IM given by Patient Access Associate on 10/19/2019 at 1:30pm.     [name] 10/20/2019, 8:18 AM

## 2019-10-20 NOTE — Progress Notes (Signed)
This note also relates to the following rows which could not be included: Resp - Cannot attach notes to unvalidated device data    10/20/19 1042  Assess: MEWS Score  ECG Heart Rate (!) 140  Assess: MEWS Score  MEWS Temp 0  MEWS Systolic 0  MEWS Pulse 3  MEWS RR 0  MEWS LOC 0  MEWS Score 3  MEWS Score Color Yellow  Assess: if the MEWS score is Yellow or Red  Were vital signs taken at a resting state? Yes  Focused Assessment Documented focused assessment  Early Detection of Sepsis Score *See Row Information* Medium  MEWS guidelines implemented *See Row Information* Yes  Treat  MEWS Interventions Administered scheduled meds/treatments  Take Vital Signs  Increase Vital Sign Frequency  Yellow: Q 2hr X 2 then Q 4hr X 2, if remains yellow, continue Q 4hrs  Escalate  MEWS: Escalate Yellow: discuss with charge nurse/RN and consider discussing with provider and RRT  Notify: Charge Nurse/RN  Name of Charge Nurse/RN Notified Ree Kida  Date Charge Nurse/RN Notified 10/20/19  Time Charge Nurse/RN Notified 1043  Notify: Provider  Provider Name/Title Dr. Lady Gary  Date Provider Notified 10/20/19  Time Provider Notified 1043  Notification Type Page  Notification Reason Change in status  Response See new orders  Date of Provider Response 10/20/19  Time of Provider Response 1045

## 2019-10-20 NOTE — Evaluation (Signed)
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  He is normally able to be very independent and active and does not need AD.  Though he was able to maintain static standing and some limited in-room ambulation he is far from his functional baseline.  Pt also with elevated HR t/o the session (nursing aware and present during ambulation portion of PT Exam) reaching nearly 150 bpm with modest ambulation.  Pt showed good effort and was very pleasant t/o the session.  He should be able to return home with HHPT once medically cleared for d/c.     Follow Up Recommendations Home health PT    Equipment Recommendations  None recommended by PT    Recommendations for Other Services       Precautions / Restrictions Restrictions Weight Bearing Restrictions: No      Mobility  Bed Mobility Overal bed mobility: Needs Assistance Bed Mobility: Supine to Sit     Supine to sit: Min guard;Min assist     General bed mobility comments: Plenty of cuing for log roll and abdominal protection, pt able to slowly, but w/ only very light assist get to sitting EOB  Transfers Overall transfer level: Modified independent Equipment used: None             General transfer comment: Multiple sit to stand efforts, able to rise w/o assist, light use of back of legs on bed to maintain standing  Ambulation/Gait Ambulation/Gait assistance: Supervision Gait Distance (Feet): 15 Feet Assistive device: None       General Gait Details: Pt with very slow and guarded gait.  Does report being much different than  baseline.  PT recommends (and pt agrees) to use an AD for the near term.  Stairs            Wheelchair Mobility    Modified Rankin (Stroke Patients Only)       Balance Overall balance assessment: Mild deficits observed, not formally tested;Modified Independent                                           Pertinent Vitals/Pain Pain Assessment: 0-10 Pain Score: 1  Pain Location: abdominal incision    Home Living Family/patient expects to be discharged to:: Private residence Living Arrangements: Spouse/significant other;Children   Type of Home: House Home Access: Ramped entrance     Home Layout: Able to live on main level with bedroom/bathroom Home Equipment: Walker - 2 wheels;Cane - single point;Wheelchair - manual      Prior Function Level of Independence: Independent         Comments: Pt manages his 4+ acre property, is able to be active and does not need ADs     Hand Dominance        Extremity/Trunk Assessment   Upper Extremity Assessment Upper Extremity Assessment: Overall WFL for tasks assessed    Lower Extremity Assessment Lower Extremity Assessment: Overall WFL for tasks assessed       Communication  Communication: No difficulties  Cognition Arousal/Alertness: Awake/alert Behavior During Therapy: WFL for tasks assessed/performed Overall Cognitive Status: Within Functional Limits for tasks assessed                                        General Comments General comments (skin integrity, edema, etc.): Pt's HR in the 140s with modest activity.  Nursing in room during ambulation, limited distance/activity due to tachy issues.    Exercises     Assessment/Plan    PT Assessment Patient needs continued PT services  PT Problem List Decreased strength;Decreased activity tolerance;Decreased balance;Decreased mobility;Decreased knowledge of use of DME;Decreased safety awareness;Cardiopulmonary status limiting  activity       PT Treatment Interventions DME instruction;Stair training;Functional mobility training;Therapeutic activities;Balance training;Therapeutic exercise;Neuromuscular re-education;Patient/family education    PT Goals (Current goals can be found in the Care Plan section)  Acute Rehab PT Goals Patient Stated Goal: go home PT Goal Formulation: With patient Time For Goal Achievement: 11/03/19 Potential to Achieve Goals: Good    Frequency Min 2X/week   Barriers to discharge        Co-evaluation               AM-PAC PT "6 Clicks" Mobility  Outcome Measure Help needed turning from your back to your side while in a flat bed without using bedrails?: None Help needed moving from lying on your back to sitting on the side of a flat bed without using bedrails?: A Little Help needed moving to and from a bed to a chair (including a wheelchair)?: None Help needed standing up from a chair using your arms (e.g., wheelchair or bedside chair)?: None Help needed to walk in hospital room?: A Little Help needed climbing 3-5 steps with a railing? : A Little 6 Click Score: 21    End of Session Equipment Utilized During Treatment: Gait belt Activity Tolerance: Patient tolerated treatment well   Nurse Communication: Mobility status PT Visit Diagnosis: Muscle weakness (generalized) (M62.81);Difficulty in walking, not elsewhere classified (R26.2)    Time: 1610-9604 PT Time Calculation (min) (ACUTE ONLY): 27 min   Charges:   PT Evaluation $PT Eval Low Complexity: 1 Low PT Treatments $Therapeutic Activity: 8-22 mins        Kreg Shropshire, DPT 10/20/2019, 1:28 PM

## 2019-10-20 NOTE — Progress Notes (Signed)
   10/20/19 1021  Assess: MEWS Score  Temp 97.9 F (36.6 C)  BP 102/62  Pulse Rate 70  Resp 18  SpO2 95 %  O2 Device Room Air  Assess: MEWS Score  MEWS Temp 0  MEWS Systolic 0  MEWS Pulse 0  MEWS RR 0  MEWS LOC 0  MEWS Score 0  MEWS Score Color Chilton Si

## 2019-10-20 NOTE — Consult Note (Signed)
Cardiology Consultation:   Patient ID: [name] [name]; [id number]; 11-15-1933   Admit date: 10/17/2019 Date of Consult: 10/20/2019  Primary Care Provider: Gracelyn Nurse, MD Primary Cardiologist: Tenny Craw Primary Electrophysiologist:  None   Patient Profile:   [name] [name] is a 86 y.o. male with a hx of CAD with NSTEMI in 2007 as detailed below, moderate aortic stenosis, PVCs, syncope, and HLD who is being seen today for the evaluation of Afib at the request of Dr. Renae Gloss.  History of Present Illness:   [name] [name] was admitted to the hospital in 06/2005 with a NSTEMI. LHC demonstrated an occluded LCx with otherwise nonobstructive disease in the LAD with 20 and 40% lesions with preserved LVSF. He had reperfusion in his OM with wire passage alone and did not require angioplasty or stenting. Nuclear stress test in 2008 showed a prior lateral MI with mild peri-infarct ischemia. Echo from 06/2010 showed an EF of 50-55%, mild LVH, Gr1DD, and mild TR. Holter monitor from 2013 showed sinus rhythm with frequent PVCs, couplets, and triplets with occasional PACs.  He was recently admitted to the hospital in early 09/2019 with atypical chest pain.  High-sensitivity troponin negative x2 with a normal BNP.  Echo showed an EF of 50 to 55%, mild LVH, no regional wall motion abnormalities, grade 1 diastolic dysfunction, normal RV systolic function and ventricular cavity size, mildly dilated left atrium, mild mitral regurgitation, and moderate aortic stenosis.  Lexiscan MPI was an intermediate risk study with a medium size defect of severe severity present in the mid inferolateral and apical lateral location with findings consistent with prior MI and mild peri-infarct ischemia.  This imaging study was not significantly different when compared to his most recent stress test in 2008.  In this setting, no further work-up was recommended.  CTA chest negative for PE.  He was admitted to [facility] on 10/17/2019 with a  2-day history of abdominal pain with associated nausea and dry heaving.  CT of the abdomen/pelvis showed mildly dilated thick-walled small bowel loops in the right abdomen associated with mesenteric edema and stranding.  Labs showed a mild leukocytosis of 12.1 peaking at 21.6 and currently improved to 10.7, hemoglobin 13.9 trending to 10.3 postoperatively, normal renal and liver function, initial high-sensitivity troponin of 9 with a delta of 10, Covid negative, lipase 17.  EKG on 10/17/2019 showed sinus rhythm.  On 10/16/2021 when he went exploratory laparotomy with small bowel resection of approximately 60 to 7 cm of gangrenous small bowel which was left open for second look procedure.  Postoperatively he briefly required vasopressor support with norepinephrine which has subsequently been weaned off.  On 5/30 he underwent repeat exploratory laparotomy with a further 15 cm segment of small bowel resection with anastomosis.  He was transferred out of the ICU and onto the floor on 5/31.  On the morning of 6/1, he developed a tachycardic heart rate with rates in the 1 teens to 120s bpm.  He denied any chest pain, palpitations, or dyspnea.  Telemetry monitor showed he was in sinus rhythm at 1229 this morning with rates in the 70s bpm.  He developed new onset A. fib/flutter with RVR at 1:32 AM persisting until 3:32 AM with redevelopment of A. fib/flutter with RVR at 5:28 AM persisting until 5:37 AM and again A. fib/flutter at 9:51 AM persisting at 11:29 AM.  Repeat EKG is pending.  Labs this morning showed a potassium of 4.2, BUN 24, serum creatinine 0.82, magnesium 1.9,  high-sensitivity troponin 12, BNP 459, WBC 10.7, Hgb 10.3, PLT 132.    He was given 2 IV pushes of 5 mg metoprolol this morning along with being started on amiodarone infusion.  He has received 25 mg of Toprol-XL at 10:21 AM, 5 mg IV Lopressor at 10:47 AM, and 12.5 mg of Lopressor at 11:02 AM. Currently, in sinus rhythm with heart rates in the 70s bpm.   Overnight, with the above-noted A. fib/flutter burden he reported tachypalpitations and mild shortness of breath without chest pain, dizziness, presyncope, syncope.  In sinus rhythm, he is asymptomatic.    Past Medical History:  Diagnosis Date  . Bradycardia    relative, precluding up titration of his beta-blocker  . CAD (coronary artery disease)   . HLD (hyperlipidemia)   . Non-ST elevated myocardial infarction (non-STEMI) (HCC)    2/07: LHC with occluded OM, LAD 20% + 40%; normal LVF => PTCA of OM with wire passage alone   . PVC's (premature ventricular contractions)   . Syncope    hx    Past Surgical History:  Procedure Laterality Date  . APPLICATION OF WOUND VAC N/A 10/17/2019   Procedure: APPLICATION OF WOUND VAC;  Surgeon: Henrene Dodge, MD;  Location: [facility] ORS;  Service: General;  Laterality: N/A;  [id number]   . APPLICATION OF WOUND VAC  10/18/2019   Procedure: APPLICATION OF WOUND VAC;  Surgeon: Henrene Dodge, MD;  Location: [facility] ORS;  Service: General;;  . BOWEL RESECTION N/A 10/17/2019   Procedure: SMALL BOWEL RESECTION;  Surgeon: Henrene Dodge, MD;  Location: [facility] ORS;  Service: General;  Laterality: N/A;  . BOWEL RESECTION  10/18/2019   Procedure: SMALL BOWEL RESECTION;  Surgeon: Henrene Dodge, MD;  Location: [facility] ORS;  Service: General;;  . CARDIAC CATHETERIZATION    . coronary arteriogram    . EXPLORATORY LAPAROTOMY  1970s   for bleeding peptic ulcer  . LAPAROTOMY N/A 10/17/2019   Procedure: EXPLORATORY LAPAROTOMY;  Surgeon: Henrene Dodge, MD;  Location: [facility] ORS;  Service: General;  Laterality: N/A;  . LAPAROTOMY N/A 10/18/2019   Procedure: EXPLORATORY LAPAROTOMY;  Surgeon: Henrene Dodge, MD;  Location: [facility] ORS;  Service: General;  Laterality: N/A;  . left ventriculogram    . reperfusion of obtuse marginal with a wife alone       Home Meds: Prior to Admission medications   Medication Sig Start Date End Date Taking? Authorizing Provider  aspirin EC 81 MG tablet  Take 1 tablet (81 mg total) by mouth daily. 03/21/17  Yes Pricilla Riffle, MD  BEE POLLEN PO Take 1 capsule by mouth daily.   Yes [provider]  metoprolol succinate (TOPROL-XL) 25 MG 24 hr tablet Take 1 tablet (25 mg total) by mouth daily. Please make yearly appt with Dr. Tenny Craw for June for future refills. 1st attempt Patient taking differently: Take 25 mg by mouth at bedtime.  09/07/19  Yes Pricilla Riffle, MD  nitroGLYCERIN (NITROSTAT) 0.4 MG SL tablet place 1 tablet under the tongue if needed every 5 minutes for chest pain for 3 doses IF NO RELIEF AFTER 3RD DOSE CALL PRESCRIBER OR 911. 10/24/18  Yes Pricilla Riffle, MD  rosuvastatin (CRESTOR) 20 MG tablet Take 1 tablet (20 mg total) by mouth daily. 09/30/19  Yes Pricilla Riffle, MD    Inpatient Medications: Scheduled Meds: . diltiazem  10 mg Intravenous Once  . enoxaparin (LOVENOX) injection  40 mg Subcutaneous Q24H  . ketorolac  15 mg Intravenous Q6H  . metoprolol  tartrate  12.5 mg Oral BID  . pantoprazole (PROTONIX) IV  40 mg Intravenous QHS   Continuous Infusions: . sodium chloride    . piperacillin-tazobactam (ZOSYN)  IV 3.375 g (10/20/19 0622)   PRN Meds: HYDROmorphone (DILAUDID) injection, metoprolol tartrate, ondansetron **OR** ondansetron (ZOFRAN) IV, polyethylene glycol  Allergies:   Allergies  Allergen Reactions  . Other Other (See Comments)    Tetanus shot  . Tetanus Toxoid, Adsorbed     Tetanus shot  . Tetanus Toxoids     Tetanus shot    Social History:   Social History   Socioeconomic History  . Marital status: Divorced    Spouse name: Not on file  . Number of children: 3  . Years of education: Not on file  . Highest education level: Not on file  Occupational History  . Not on file  Tobacco Use  . Smoking status: Never Smoker  . Smokeless tobacco: Former Neurosurgeon    Types: Chew  Substance and Sexual Activity  . Alcohol use: No    Alcohol/week: 0.0 standard drinks  . Drug use: No  . Sexual activity: Not  on file  Other Topics Concern  . Not on file  Social History Narrative   He is married, divorced, and now leaving with his ex-wife. Continues to drive tractor-trailers.    Social Determinants of Health   Financial Resource Strain:   . Difficulty of Paying Living Expenses:   Food Insecurity:   . Worried About Programme researcher, broadcasting/film/video in the Last Year:   . Barista in the Last Year:   Transportation Needs:   . Freight forwarder (Medical):   Marland Kitchen Lack of Transportation (Non-Medical):   Physical Activity:   . Days of Exercise per Week:   . Minutes of Exercise per Session:   Stress:   . Feeling of Stress :   Social Connections:   . Frequency of Communication with Friends and Family:   . Frequency of Social Gatherings with Friends and Family:   . Attends Religious Services:   . Active Member of Clubs or Organizations:   . Attends Banker Meetings:   Marland Kitchen Marital Status:   Intimate Partner Violence:   . Fear of Current or Ex-Partner:   . Emotionally Abused:   Marland Kitchen Physically Abused:   . Sexually Abused:      Family History:   Family History  Problem Relation Age of Onset  . Heart attack Mother   . Heart attack Father     ROS:  Review of Systems  Constitutional: Positive for malaise/fatigue. Negative for chills, diaphoresis, fever and weight loss.  HENT: Negative for congestion.   Eyes: Negative for discharge and redness.  Respiratory: Positive for shortness of breath. Negative for cough, sputum production and wheezing.   Cardiovascular: Positive for palpitations. Negative for chest pain, orthopnea, claudication, leg swelling and PND.  Gastrointestinal: Positive for abdominal pain. Negative for blood in stool, diarrhea, heartburn, melena, nausea and vomiting.  Musculoskeletal: Negative for falls and myalgias.  Skin: Negative for rash.  Neurological: Positive for weakness. Negative for dizziness, tingling, tremors, sensory change, speech change, focal weakness  and loss of consciousness.  Endo/Heme/Allergies: Does not bruise/bleed easily.  Psychiatric/Behavioral: Negative for substance abuse. The patient is not nervous/anxious.   All other systems reviewed and are negative.     Physical Exam/Data:   Vitals:   10/20/19 0055 10/20/19 0452 10/20/19 1021 10/20/19 1057  BP: 123/61 107/60 102/62 96/63  Pulse: 73  70 70 84  Resp: 16 16 18 18   Temp: 98.7 F (37.1 C) 98.6 F (37 C) 97.9 F (36.6 C) 97.7 F (36.5 C)  TempSrc: Oral Oral Oral Oral  SpO2: 91% 94% 95% 93%  Weight:      Height:        Intake/Output Summary (Last 24 hours) at 10/20/2019 1112 Last data filed at 10/20/2019 0903 Gross per 24 hour  Intake 811.44 ml  Output 855 ml  Net -43.56 ml   Filed Weights   10/17/19 0435 10/17/19 1426  Weight: 78 kg 84.4 kg   Body mass index is 27.48 kg/m.   Physical Exam: General: Well developed, well nourished, in no acute distress. Head: Normocephalic, atraumatic, sclera non-icteric, no xanthomas, nares without discharge.  Neck: Negative for carotid bruits. JVD not elevated. Lungs: Clear bilaterally to auscultation without wheezes, rales, or rhonchi. Breathing is unlabored. Heart: RRR with S1 S2. II/VI systolic murmur at the RUSB, no rubs, or gallops appreciated. Abdomen: Soft, mildly tender, non-distended with normoactive bowel sounds. No hepatomegaly. No rebound/guarding. No obvious abdominal masses. Msk:  Strength and tone appear normal for age. Extremities: No clubbing or cyanosis. No edema. Distal pedal pulses are 2+ and equal bilaterally. Neuro: Alert and oriented X 3. No facial asymmetry. No focal deficit. Moves all extremities spontaneously. Psych:  Responds to questions appropriately with a normal affect.   EKG:  The EKG was personally reviewed and demonstrates: 10/17/2019 - NSR, 81 bpm, rare PVC, no acute ST-T changes Telemetry:  Telemetry was personally reviewed and demonstrates: sinus rhythm at 1229 this morning with rates in  the 70s bpm.  He developed new onset A. fib/flutter with RVR at 1:32 AM persisting until 3:32 AM with redevelopment of A. fib/flutter with RVR at 5:28 AM persisting until 5:37 AM and again A. fib/flutter at 9:51 AM persisting at 11:29 AM and is maintaining sinus rhythm with heart rates in the 70s bpm at this time.  Weights: Filed Weights   10/17/19 0435 10/17/19 1426  Weight: 78 kg 84.4 kg    Relevant CV Studies:  2D echo 09/21/2019: 1. Left ventricular ejection fraction, by estimation, is 50 to 55%. The  left ventricle has low normal function. The left ventricle has no regional  wall motion abnormalities. There is mild left ventricular hypertrophy.  Left ventricular diastolic parameters are consistent with Grade I diastolic dysfunction (impaired  relaxation).  2. Right ventricular systolic function is normal. The right ventricular  size is normal. Tricuspid regurgitation signal is inadequate for assessing  PA pressure.  3. Left atrial size was mildly dilated.  4. The mitral valve is abnormal. Mild mitral valve regurgitation. No  evidence of mitral stenosis.  5. The aortic valve is abnormal. Aortic valve regurgitation is not  visualized. Moderate aortic valve stenosis. Aortic valve mean gradient  measures 13.3 mmHg. Aortic valve peak gradient measures 21.8 mmHg. Aortic valve area, by VTI measures 1.41 cm. __________  Carlton Adam MPI 09/21/2019:  T wave inversion was noted during stress in the III leads.  There was no ST segment deviation noted during stress.  Defect 1: There is a medium defect of severe severity present in the mid inferolateral and apical lateral location.  Findings consistent with prior myocardial infarction with mild peri-infarct ischemia.  This is an intermediate risk study.  Nuclear stress EF: 50%.  Laboratory Data:  Chemistry Recent Labs  Lab 10/18/19 0531 10/19/19 0408 10/20/19 0339  NA 140 140 143  K 4.5 4.2 4.2  CL 108  105 108  CO2 26 27 29     GLUCOSE 138* 127* 73  BUN 22 28* 24*  CREATININE 0.84 0.71 0.82  CALCIUM 8.1* 8.0* 7.9*  GFRNONAA >60 >60 >60  GFRAA >60 >60 >60  ANIONGAP 6 8 6     Recent Labs  Lab 10/17/19 0442  PROT 7.2  ALBUMIN 4.1  AST 29  ALT 29  ALKPHOS 62  BILITOT 1.3*   Hematology Recent Labs  Lab 10/18/19 0531 10/19/19 0408 10/20/19 0339  WBC 21.6* 19.1* 10.7*  RBC 3.60* 3.09* 3.03*  HGB 12.1* 10.7* 10.3*  HCT 37.0* 31.2* 31.3*  MCV 102.8* 101.0* 103.3*  MCH 33.6 34.6* 34.0  MCHC 32.7 34.3 32.9  RDW 12.1 12.1 11.9  PLT 152 141* 132*   Cardiac EnzymesNo results for input(s): TROPONINI in the last 168 hours. No results for input(s): TROPIPOC in the last 168 hours.  BNP Recent Labs  Lab 10/20/19 0339  BNP 459.3*    DDimer No results for input(s): DDIMER in the last 168 hours.  Radiology/Studies:  CT Abdomen Pelvis W Contrast  Result Date: 10/17/2019 IMPRESSION: 1. Several mildly dilated thick-walled small bowel loops in the right abdomen with associated mesenteric edema. Proximal and distal small bowel loops are nondilated. Internal hernia with associated small bowel obstruction cannot be excluded. Differential includes regional enteritis. No free air. No pneumatosis. Surgical consultation suggested. 2. Marked sigmoid diverticulosis with no evidence of acute diverticulitis. 3. Mild prostatomegaly. 4. Nonspecific pulmonary fibrosis at the lung bases. 5. Aortic Atherosclerosis (ICD10-I70.0). Electronically Signed   By: 12/20/19 M.D.   On: 10/17/2019 07:43   DG Chest Port 1 View  Result Date: 10/20/2019 IMPRESSION: 1.  Stable cardiomegaly.  No pulmonary venous congestion. 2. Stable bilateral interstitial prominence. Chronic interstitial lung disease could present this fashion. An active interstitial process including pneumonitis/interstitial edema cannot be excluded. Similar findings noted on prior exams. Electronically Signed   By: 10/19/2019  Register   On: 10/20/2019 05:48   DG Chest Port 1  View  Result Date: 10/19/2019 IMPRESSION: 1. No acute abnormality. 2. Chronic interstitial lung disease. The appearance is similar to the recent prior exams. Electronically Signed   By: 12/20/2019 M.D.   On: 10/19/2019 17:51    Assessment and Plan:   1.  New onset A. fib with RVR: -Likely in the setting of his recent illness requiring surgical intervention -For now, would use IV amiodarone in an effort to suppress his A. fib burden with plans to transition to p.o. amiodarone when it is clear he is absorbing oral medications, likely to be used for only several weeks time followed by tapering off -As BP allows would continue low-dose Lopressor 12.5 mg twice daily -Potassium at goal -Magnesium 1.9 -TSH normal -CHA2DS2-VASc at least 4 (HTN, age x 2, vascular disease) -After discussion with general surgery, we will transition him from DVT dosed Lovenox to Eliquis 5 mg twice daily  2.  CAD involving the native coronary arteries without angina: -No symptoms concerning for angina -In the setting of new onset A. fib with RVR with a CHA2DS2-VASc of at least 4 he will be transitioned off aspirin and onto [facility] as outlined above when cleared by general surgery -Recent Lexiscan MPI from 09/2019 unchanged from study in 2008 -No plans for inpatient ischemic evaluation at this time -Continue aggressive risk factor modification and secondary prevention including Toprol-XL and Crestor  3.  Moderate aortic stenosis: -Outpatient follow-up  4.  HTN: -BP soft in the setting of  multiple doses of metoprolol as outlined above -Continue low-dose Lopressor with hold parameters  5.  HLD: -Most recent LDL of 92 from 09/2019 with goal being less than 70 at that time, patient was transitioned from simvastatin to Crestor which should be resumed prior to discharge -He will need follow-up fasting lipid and liver function as an outpatient in 11/2019    For questions or updates, please contact [facility] Please  consult [email or web address] for contact info under Cardiology/STEMI.   Signed, Eula Listenyan Zitlali Primm, PA-C [facility] Pager: [phone] 10/20/2019, 11:12 AM

## 2019-10-20 NOTE — Consult Note (Signed)
.                Medical Consultation   TALLEY KREISER  TDS:[id number]  DOB: [birth year]  DOA: 10/17/2019  PCP: Gracelyn Nurse, MD   Requesting physician: Dr Lady Gary  Reason for consultation: tachycardia   History of Present Illness: [name] [name] is an 86 y.o. 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  Patient was in the ICU postoperatively and remained stable and was transferred to the floor on postop day 1.  Consultation requested due to tachycardia with heart rate between 115 and 120 Patient appears comfortable and denies chest pain, palpitations or shortness of breath.  Feels like he has been recovering well from his surgery.  Denies abdominal pain.  Already had a bowel movement on postop day 1. Cardiac monitor reading A. fib with rate about 120-124   Review of Systems:  ROS As per HPI otherwise 10 point review of systems negative.     Past Medical History: Past Medical History:  Diagnosis Date  . Bradycardia    relative, precluding up titration of his beta-blocker  . CAD (coronary artery disease)   . HLD (hyperlipidemia)   . Non-ST elevated myocardial infarction (non-STEMI) (HCC)    2/07: LHC with occluded OM, LAD 20% + 40%; normal LVF => PTCA of OM with wire passage alone   . PVC's (premature ventricular contractions)   . Syncope    hx    Past Surgical History: Past Surgical History:  Procedure Laterality Date  . APPLICATION OF WOUND VAC N/A 10/17/2019   Procedure: APPLICATION OF WOUND VAC;  Surgeon: Henrene Dodge, MD;  Location: [facility] ORS;  Service: General;  Laterality: N/A;  [id number]   . APPLICATION OF WOUND VAC  10/18/2019   Procedure: APPLICATION OF WOUND VAC;  Surgeon:  Henrene Dodge, MD;  Location: [facility] ORS;  Service: General;;  . BOWEL RESECTION N/A 10/17/2019   Procedure: SMALL BOWEL RESECTION;  Surgeon: Henrene Dodge, MD;  Location: [facility] ORS;  Service: General;  Laterality: N/A;  . BOWEL RESECTION  10/18/2019   Procedure: SMALL BOWEL RESECTION;  Surgeon: Henrene Dodge, MD;  Location: [facility] ORS;  Service: General;;  . CARDIAC CATHETERIZATION    . coronary arteriogram    . EXPLORATORY LAPAROTOMY  1970s   for bleeding peptic ulcer  . LAPAROTOMY N/A 10/17/2019   Procedure: EXPLORATORY LAPAROTOMY;  Surgeon: Henrene Dodge, MD;  Location: [facility] ORS;  Service: General;  Laterality: N/A;  . LAPAROTOMY N/A 10/18/2019   Procedure: EXPLORATORY LAPAROTOMY;  Surgeon: Henrene Dodge, MD;  Location: [facility] ORS;  Service: General;  Laterality: N/A;  . left ventriculogram    . reperfusion of obtuse marginal with a wife alone       Allergies:   Allergies  Allergen Reactions  . Other Other (See Comments)    Tetanus shot  . Tetanus Toxoid, Adsorbed     Tetanus shot  . Tetanus Toxoids     Tetanus shot     Social History:  reports that he has never smoked. He has quit using smokeless tobacco.  His smokeless tobacco use included chew. He reports that he does not drink alcohol or use drugs.   Family History: Family History  Problem Relation Age of Onset  .  Heart attack Mother   . Heart attack Father     Unacceptable: Noncontributory, unremarkable, or negative. Acceptable: Family history reviewed and not pertinent (If you reviewed it)   Physical Exam: Vitals:   10/19/19 1800 10/19/19 2000 10/19/19 2121 10/20/19 0055  BP: 122/66  129/61 123/61  Pulse:   75 73  Resp: 12  20 16   Temp:  98.1 F (36.7 C) 98.5 F (36.9 C) 98.7 F (37.1 C)  TempSrc:  Oral Oral Oral  SpO2:   96% 91%  Weight:      Height:        Constitutional: Alert and oriented x 3 . Not in any apparent distress HEENT:      Head: Normocephalic and atraumatic.         Eyes: PERLA, EOMI,  Conjunctivae are normal. Sclera is non-icteric.       Mouth/Throat: Mucous membranes are moist.       Neck: Supple with no signs of meningismus. Cardiovascular:  Irregular. 2/6 murmur,no gallops, or rubs. 2+ symmetrical distal pulses are present . No JVD. NoLE edema Respiratory: Respiratory effort normal .Lungs sounds clear bilaterally. No wheezes, crackles, or rhonchi.  Gastrointestinal:  Midline surgical wound with wound VAC.  Soft, non tender, and non distended with positive bowel sounds. No rebound or guarding. Genitourinary: No CVA tenderness. Musculoskeletal: Nontender with normal range of motion in all extremities. No edema, cyanosis, or erythema of extremities. Neurologic: Normal speech and language. Face is symmetric. Moving all extremities. No gross focal neurologic deficits . Skin: Skin is warm, dry.  No rash or ulcers Psychiatric: Mood and affect are normal Speech and behavior are normal  Data reviewed:  I have personally reviewed following labs and imaging studies Labs:  CBC: Recent Labs  Lab 10/17/19 0442 10/17/19 1930 10/18/19 0531 10/19/19 0408  WBC 12.1* 18.6* 21.6* 19.1*  NEUTROABS 9.4* 16.8*  --   --   HGB 13.9 13.0 12.1* 10.7*  HCT 41.1 37.7* 37.0* 31.2*  MCV 99.3 100.3* 102.8* 101.0*  PLT 193 162 152 141*    Basic Metabolic Panel: Recent Labs  Lab 10/17/19 0442 10/17/19 0442 10/17/19 1930 10/17/19 1930 10/18/19 0531 10/19/19 0003 10/19/19 0408  NA 140  --  140  --  140  --  140  K 4.0   < > 4.5   < > 4.5  --  4.2  CL 101  --  108  --  108  --  105  CO2 26  --  26  --  26  --  27  GLUCOSE 175*  --  175*  --  138*  --  127*  BUN 15  --  21  --  22  --  28*  CREATININE 0.84  --  0.77  --  0.84  --  0.71  CALCIUM 9.6  --  8.1*  --  8.1*  --  8.0*  MG  --   --  1.7  --  1.6* 2.2 2.1  PHOS  --   --  2.8  --  3.8  --  3.2   < > = values in this interval not displayed.   GFR Estimated Creatinine Clearance: 70 mL/min (by C-G formula based on SCr of 0.71  mg/dL). Liver Function Tests: Recent Labs  Lab 10/17/19 0442  AST 29  ALT 29  ALKPHOS 62  BILITOT 1.3*  PROT 7.2  ALBUMIN 4.1   Recent Labs  Lab 10/17/19 0442  LIPASE 17   No results  for input(s): AMMONIA in the last 168 hours. Coagulation profile No results for input(s): INR, PROTIME in the last 168 hours.  Cardiac Enzymes: No results for input(s): CKTOTAL, CKMB, CKMBINDEX, TROPONINI in the last 168 hours. BNP: Invalid input(s): POCBNP CBG: Recent Labs  Lab 10/17/19 1427  GLUCAP 184*   D-Dimer No results for input(s): DDIMER in the last 72 hours. Hgb A1c No results for input(s): HGBA1C in the last 72 hours. Lipid Profile No results for input(s): CHOL, HDL, LDLCALC, TRIG, CHOLHDL, LDLDIRECT in the last 72 hours. Thyroid function studies No results for input(s): TSH, T4TOTAL, T3FREE, THYROIDAB in the last 72 hours.  Invalid input(s): FREET3 Anemia work up No results for input(s): VITAMINB12, FOLATE, FERRITIN, TIBC, IRON, RETICCTPCT in the last 72 hours. Urinalysis No results found for: COLORURINE, APPEARANCEUR, [place], [place], GLUCOSEU, [place], [place], KETONESUR, PROTEINUR, UROBILINOGEN, NITRITE, [place]   Microbiology Recent Results (from the past 240 hour(s))  SARS Coronavirus 2 by RT PCR (hospital order, performed in [facility] hospital lab) Nasopharyngeal Nasopharyngeal Swab     Status: None   Collection Time: 10/17/19  6:29 AM   Specimen: Nasopharyngeal Swab  Result Value Ref Range Status   SARS Coronavirus 2 NEGATIVE NEGATIVE Final    Comment: (NOTE) SARS-CoV-2 target nucleic acids are NOT DETECTED. The SARS-CoV-2 RNA is generally detectable in upper and lower respiratory specimens during the acute phase of infection. The lowest concentration of SARS-CoV-2 viral copies this assay can detect is 250 copies / mL. A negative result does not preclude SARS-CoV-2 infection and should not be used as the sole basis for treatment or other patient  management decisions.  A negative result may occur with improper specimen collection / handling, submission of specimen other than nasopharyngeal swab, presence of viral mutation(s) within the areas targeted by this assay, and inadequate number of viral copies (<250 copies / mL). A negative result must be combined with clinical observations, patient history, and epidemiological information. Fact Sheet for Patients:   StrictlyIdeas.no Fact Sheet for Healthcare Providers: BankingDealers.co.za This test is not yet approved or cleared  by the [place] FDA and has been authorized for detection and/or diagnosis of SARS-CoV-2 by FDA under an Emergency Use Authorization (EUA).  This EUA will remain in effect (meaning this test can be used) for the duration of the COVID-19 declaration under Section 564(b)(1) of the Act, 21 U.S.C. section 360bbb-3(b)(1), unless the authorization is terminated or revoked sooner. Performed at [facility], [address] [place], [place] [address]   MRSA PCR Screening     Status: None   Collection Time: 10/17/19  2:32 PM   Specimen: Nasopharyngeal  Result Value Ref Range Status   MRSA by PCR NEGATIVE NEGATIVE Final    Comment:        The GeneXpert MRSA Assay (FDA approved for NASAL specimens only), is one component of a comprehensive MRSA colonization surveillance program. It is not intended to diagnose MRSA infection nor to guide or monitor treatment for MRSA infections. Performed at [facility], [address] [place],  [address]        Inpatient Medications:   Scheduled Meds: . Chlorhexidine Gluconate Cloth  6 each Topical Daily  . enoxaparin (LOVENOX) injection  40 mg Subcutaneous Q24H  . ketorolac  15 mg Intravenous Q6H  . metoprolol tartrate  5 mg Intravenous Q6H  . pantoprazole (PROTONIX) IV  40 mg Intravenous QHS   Continuous Infusions: . sodium chloride      . piperacillin-tazobactam (ZOSYN)  IV 3.375 g (10/19/19 2315)     Radiological Exams on Admission: DG Chest Port 1 View  Result Date: 10/19/2019 CLINICAL DATA:  Follow-up of abnormal chest x-ray on 09/21/2019 EXAM: PORTABLE CHEST 1 VIEW COMPARISON:  Chest x-rays dated 09/21/2019 and 07/07/2005 and chest CT dated 09/21/2019 FINDINGS: There is chronic accentuation of the interstitial markings peripherally in both lungs, worse on the left than the right. This is demonstrated to be chronic interstitial disease on the prior chest CT dated 09/21/2019. No acute infiltrates or effusions. Heart size and pulmonary vascularity are normal. No significant bone abnormality. IMPRESSION: 1. No acute abnormality. 2. Chronic interstitial lung disease. The appearance is similar to the recent prior exams. Electronically Signed   By: Francene Boyers M.D.   On: 10/19/2019 17:51    Impression/Recommendations Active Problems:  New onset atrial fibrillation with rapid ventricular response (HCC) -Patient postop day 1 post exploratory laparotomy with bowel resection presenting with rapid A. fib with rate 115-124.  No prior history of same -Possibly related to acute illness -Twelve-lead EKG, BMP, magnesium, CBC and PT PTT, troponin, BNP and chest x-ray -Currently on metoprolol 5 mg every 6 IV -We will try diltiazem bolus for rate control to keep her rate under 120 -IV hydration -Further orders based on response to intervention above. -On the basis of age patient is CHA2DS2-VASc of 2 and may need systemic anticoagulation for stroke prevention -Patient had echocardiogram May 3 with moderate aortic stenosis  Moderate aortic stenosis -Seen on echo 09/21/2019 -No acute concerns    CAD (coronary artery disease) -No complaints of chest pain, shortness of breath -Follow-up EKG and troponin -Patient had a nonacute stress test on 09/21/2019    S/P exploratory laparotomy with small bowel resection -Continued management per  surgical service     Thank you for this consultation.  Our [facility] hospitalist team will follow the patient with you.   Time Spent: 70  Andris Baumann M.D. [facility] 10/20/2019, 3:10 AM

## 2019-10-21 DIAGNOSIS — I480 Paroxysmal atrial fibrillation: Secondary | ICD-10-CM

## 2019-10-21 LAB — MAGNESIUM: Magnesium: 2 mg/dL (ref 1.7–2.4)

## 2019-10-21 LAB — CBC
HCT: 30.5 % — ABNORMAL LOW (ref 39.0–52.0)
Hemoglobin: 10.6 g/dL — ABNORMAL LOW (ref 13.0–17.0)
MCH: 34.2 pg — ABNORMAL HIGH (ref 26.0–34.0)
MCHC: 34.8 g/dL (ref 30.0–36.0)
MCV: 98.4 fL (ref 80.0–100.0)
Platelets: 135 10*3/uL — ABNORMAL LOW (ref 150–400)
RBC: 3.1 MIL/uL — ABNORMAL LOW (ref 4.22–5.81)
RDW: 11.9 % (ref 11.5–15.5)
WBC: 7.5 10*3/uL (ref 4.0–10.5)
nRBC: 0 % (ref 0.0–0.2)

## 2019-10-21 LAB — BASIC METABOLIC PANEL
Anion gap: 7 (ref 5–15)
BUN: 21 mg/dL (ref 8–23)
CO2: 28 mmol/L (ref 22–32)
Calcium: 7.8 mg/dL — ABNORMAL LOW (ref 8.9–10.3)
Chloride: 105 mmol/L (ref 98–111)
Creatinine, Ser: 0.79 mg/dL (ref 0.61–1.24)
GFR calc Af Amer: >60 mL/min (ref >60)
GFR calc non Af Amer: >60 mL/min (ref >60)
Glucose, Bld: 99 mg/dL (ref 70–99)
Potassium: 4 mmol/L (ref 3.5–5.1)
Sodium: 140 mmol/L (ref 135–145)

## 2019-10-21 LAB — SURGICAL PATHOLOGY

## 2019-10-21 LAB — PHOSPHORUS: Phosphorus: 3.1 mg/dL (ref 2.5–4.6)

## 2019-10-21 NOTE — Progress Notes (Signed)
Hospitalist Consult PROGRESS NOTE    [name] [name]  GDJ:[id number] DOB: November 09, 1933 DOA: 10/17/2019 PCP: Baxter Hire, MD   Brief Narrative:  Patient seen and examined.  Remains on nonprogressive cardiac status.  Heart rate controlled.  Remains on IV amiodarone.  General surgery primary.  Hospitalist on consult.   Assessment & Plan:   Active Problems:   Hyperlipidemia   CAD (coronary artery disease)   Atrial fibrillation with rapid ventricular response (HCC)   Abdominal pain   Gangrene concurrent with and due to internal hernia of abdomen   S/P exploratory laparotomy   AF (paroxysmal atrial fibrillation) (HCC)  paroxysmal atrial fibrillation with rapid ventricular response patient on IV amiodarone at the moment cardiology was involved plan to transition to p.o. amiodarone tomorrow cardiology started Eliquis Plan: Per cardiology recommendations  postop status post small bowel anastomosis currently postoperative day 3 and appears to be recovering quite well management per general surgery, primary service Plan: Diet advancement per general surgery antibiotics per general surgery, currently on Zosyn Mobilize  Hyperlipidemia statin currently on hold  coronary artery disease no symptoms continue Toprol-XL  [facility] hospitalist service to sign off at this time.  Thank you for involving [place] in the care of your patient.  Please do not hesitate to reach out if you have any further questions or concerns.   DVT prophylaxis: Eliquis Code Status: Full Family Communication: None today Disposition Plan: Per general surgery   Consultants:   Hospitalist  cardiology   Subjective: Seen and examined.  Resting comfortably.  Sitting up in chair.  No acute distress.  Objective: Vitals:   10/21/19 1000 10/21/19 1100 10/21/19 1200 10/21/19 1300  BP: 117/75  139/88 98/63  Pulse: (!) 53 (!) 53 (!) 55 (!) 58  Resp: 15 11 18 19   Temp: 98.3 F (36.8 C)  98.7 F (37.1 C)     TempSrc: Oral  Oral   SpO2: 96% 98% 97% 97%  Weight:      Height:        Intake/Output Summary (Last 24 hours) at 10/21/2019 1639 Last data filed at 10/21/2019 1300 Gross per 24 hour  Intake 1040.27 ml  Output 1125 ml  Net -84.73 ml   Filed Weights   10/17/19 0435 10/17/19 1426  Weight: 78 kg 84.4 kg    Examination:  General exam: Appears calm and comfortable  Respiratory system: Clear to auscultation. Respiratory effort normal. Cardiovascular system: S1 & S2 heard, RRR. No JVD, murmurs, rubs, gallops or clicks. No pedal edema. Gastrointestinal system: Abdomen is nondistended, soft and nontender. No organomegaly or masses felt. Normal bowel sounds heard. Central nervous system: Alert and oriented. No focal neurological deficits. Extremities: Symmetric 5 x 5 power. Skin: No rashes, lesions or ulcers Psychiatry: Judgement and insight appear normal. Mood & affect appropriate.     Data Reviewed: I have personally reviewed following labs and imaging studies  CBC: Recent Labs  Lab 10/17/19 0442 10/17/19 0442 10/17/19 1930 10/18/19 0531 10/19/19 0408 10/20/19 0339 10/21/19 0458  WBC 12.1*   < > 18.6* 21.6* 19.1* 10.7* 7.5  NEUTROABS 9.4*  --  16.8*  --   --   --   --   HGB 13.9   < > 13.0 12.1* 10.7* 10.3* 10.6*  HCT 41.1   < > 37.7* 37.0* 31.2* 31.3* 30.5*  MCV 99.3   < > 100.3* 102.8* 101.0* 103.3* 98.4  PLT 193   < > 162 152 141* 132* 135*   < > =  values in this interval not displayed.   Basic Metabolic Panel: Recent Labs  Lab 10/17/19 1930 10/17/19 1930 10/18/19 0531 10/19/19 0003 10/19/19 0408 10/20/19 0339 10/21/19 0458  NA 140  --  140  --  140 143 140  K 4.5  --  4.5  --  4.2 4.2 4.0  CL 108  --  108  --  105 108 105  CO2 26  --  26  --  27 29 28   GLUCOSE 175*  --  138*  --  127* 73 99  BUN 21  --  22  --  28* 24* 21  CREATININE 0.77  --  0.84  --  0.71 0.82 0.79  CALCIUM 8.1*  --  8.1*  --  8.0* 7.9* 7.8*  MG 1.7   < > 1.6* 2.2 2.1 1.9 2.0  PHOS  2.8  --  3.8  --  3.2 3.1 3.1   < > = values in this interval not displayed.   GFR: Estimated Creatinine Clearance: 70 mL/min (by C-G formula based on SCr of 0.79 mg/dL). Liver Function Tests: Recent Labs  Lab 10/17/19 0442  AST 29  ALT 29  ALKPHOS 62  BILITOT 1.3*  PROT 7.2  ALBUMIN 4.1   Recent Labs  Lab 10/17/19 0442  LIPASE 17   No results for input(s): AMMONIA in the last 168 hours. Coagulation Profile: Recent Labs  Lab 10/20/19 0339  INR 1.1   Cardiac Enzymes: No results for input(s): CKTOTAL, CKMB, CKMBINDEX, TROPONINI in the last 168 hours. BNP (last 3 results) No results for input(s): PROBNP in the last 8760 hours. HbA1C: No results for input(s): HGBA1C in the last 72 hours. CBG: Recent Labs  Lab 10/17/19 1427  GLUCAP 184*   Lipid Profile: No results for input(s): CHOL, HDL, LDLCALC, TRIG, CHOLHDL, LDLDIRECT in the last 72 hours. Thyroid Function Tests: Recent Labs    10/20/19 0618  TSH 0.719   Anemia Panel: No results for input(s): VITAMINB12, FOLATE, FERRITIN, TIBC, IRON, RETICCTPCT in the last 72 hours. Sepsis Labs: No results for input(s): PROCALCITON, LATICACIDVEN in the last 168 hours.  Recent Results (from the past 240 hour(s))  SARS Coronavirus 2 by RT PCR (hospital order, performed in [facility] hospital lab) Nasopharyngeal Nasopharyngeal Swab     Status: None   Collection Time: 10/17/19  6:29 AM   Specimen: Nasopharyngeal Swab  Result Value Ref Range Status   SARS Coronavirus 2 NEGATIVE NEGATIVE Final    Comment: (NOTE) SARS-CoV-2 target nucleic acids are NOT DETECTED. The SARS-CoV-2 RNA is generally detectable in upper and lower respiratory specimens during the acute phase of infection. The lowest concentration of SARS-CoV-2 viral copies this assay can detect is 250 copies / mL. A negative result does not preclude SARS-CoV-2 infection and should not be used as the sole basis for treatment or other patient management decisions.  A  negative result may occur with improper specimen collection / handling, submission of specimen other than nasopharyngeal swab, presence of viral mutation(s) within the areas targeted by this assay, and inadequate number of viral copies (<250 copies / mL). A negative result must be combined with clinical observations, patient history, and epidemiological information. Fact Sheet for Patients:   10/19/19 Fact Sheet for Healthcare Providers: BoilerBrush.com.cy This test is not yet approved or cleared  by the https://pope.com/ FDA and has been authorized for detection and/or diagnosis of SARS-CoV-2 by FDA under an Emergency Use Authorization (EUA).  This EUA will remain in effect (  meaning this test can be used) for the duration of the COVID-19 declaration under Section 564(b)(1) of the Act, 21 U.S.C. section 360bbb-3(b)(1), unless the authorization is terminated or revoked sooner. Performed at [facility], [address] [place], [place] [address]   MRSA PCR Screening     Status: None   Collection Time: 10/17/19  2:32 PM   Specimen: Nasopharyngeal  Result Value Ref Range Status   MRSA by PCR NEGATIVE NEGATIVE Final    Comment:        The GeneXpert MRSA Assay (FDA approved for NASAL specimens only), is one component of a comprehensive MRSA colonization surveillance program. It is not intended to diagnose MRSA infection nor to guide or monitor treatment for MRSA infections. Performed at [facility], [address] [place], [place] [address]          Radiology Studies: [facility] Chest [place] 1 View  Result Date: 10/20/2019 CLINICAL DATA:  Arrhythmia. Coronary artery disease. Aortic stenosis. EXAM: PORTABLE CHEST 1 VIEW COMPARISON:  10/19/2019.  09/21/2019. FINDINGS: Mediastinum and hilar structures are normal. Stable cardiomegaly. No pulmonary venous congestion. Stable bilateral interstitial prominence. Chronic  interstitial lung disease could present in this fashion. Active interstitial process including pneumonitis/interstitial edema cannot be excluded. No pleural effusion or pneumothorax. Surgical clips upper abdomen. IMPRESSION: 1.  Stable cardiomegaly.  No pulmonary venous congestion. 2. Stable bilateral interstitial prominence. Chronic interstitial lung disease could present this fashion. An active interstitial process including pneumonitis/interstitial edema cannot be excluded. Similar findings noted on prior exams. Electronically Signed   By: Maisie Fus  Register   On: 10/20/2019 05:48   DG Chest Port 1 View  Result Date: 10/19/2019 CLINICAL DATA:  Follow-up of abnormal chest x-ray on 09/21/2019 EXAM: PORTABLE CHEST 1 VIEW COMPARISON:  Chest x-rays dated 09/21/2019 and 07/07/2005 and chest CT dated 09/21/2019 FINDINGS: There is chronic accentuation of the interstitial markings peripherally in both lungs, worse on the left than the right. This is demonstrated to be chronic interstitial disease on the prior chest CT dated 09/21/2019. No acute infiltrates or effusions. Heart size and pulmonary vascularity are normal. No significant bone abnormality. IMPRESSION: 1. No acute abnormality. 2. Chronic interstitial lung disease. The appearance is similar to the recent prior exams. Electronically Signed   By: Francene Boyers M.D.   On: 10/19/2019 17:51        Scheduled Meds: . apixaban  5 mg Oral BID  . Chlorhexidine Gluconate Cloth  6 each Topical Daily  . ketorolac  15 mg Intravenous Q6H  . metoprolol tartrate  12.5 mg Oral BID  . pantoprazole (PROTONIX) IV  40 mg Intravenous QHS   Continuous Infusions: . sodium chloride    . amiodarone 30 mg/hr (10/21/19 1257)  . piperacillin-tazobactam (ZOSYN)  IV 3.375 g (10/21/19 1450)     LOS: 4 days    Time spent: 35 minutes    Tresa Moore, MD [facility] Pager [phone]  If 7PM-7AM, please contact night-coverage  10/21/2019, 4:39 PM

## 2019-10-21 NOTE — TOC Initial Note (Signed)
Transition of Care [facility]) - Initial/Assessment Note    Patient Details  Name: [name] [name] MRN: [id number] Date of Birth: 1933/10/12  Transition of Care [facility]) CM/SW Contact:    Magnus Ivan, LCSW Phone Number: 10/21/2019, 12:12 PM  Clinical Narrative:              CSW met with patient at bedside to discuss PT recommendation for HHPT. Patient was alert and oriented. Patient reported he lives with his wife and adult son. Patient says he has access to a wheelchair and walker at home if needed. PCP is Dr. Wynetta Emery. Pharmacy is [facility] on [address]. Patient denied issues with obtaining medications. Patient reported he drives himself. No SNF history. Patient reported he is agreeable to HHPT if it is still recommended at discharge. Explained agency options. Patient declined having a preference. Referral made to [facility], waiting to hear back if he can accept patient.    Expected Discharge Plan: [place] Barriers to Discharge: Continued Medical Work up   Patient Goals and CMS Choice Patient states their goals for this hospitalization and ongoing recovery are:: to return home, agreeable to home health if still recommended at discharge CMS Medicare.gov Compare Post Acute Care list provided to:: Patient Choice offered to / list presented to : Patient  Expected Discharge Plan and Services Expected Discharge Plan: [place] Choice: [place] arrangements for the past 2 months: [place]: PT [place] Agency: [place] ([place]) Date [place]: 10/21/19   Representative spoke with at [place]: Corene Cornea  Prior Living Arrangements/Services Living arrangements for the past 2 months: [place] Lives with:: Spouse, Adult Children Patient language and need for interpreter reviewed:: Yes Do you feel safe going back to the  place where you live?: Yes      Need for Family Participation in Patient Care: Yes (Comment) Care giver support system in place?: Yes (comment) Current home services: DME Criminal Activity/Legal Involvement Pertinent to Current Situation/Hospitalization: No - Comment as needed  Activities of Daily Living Home Assistive Devices/Equipment: None ADL Screening (condition at time of admission) Patient's cognitive ability adequate to safely complete daily activities?: Yes Is the patient deaf or have difficulty hearing?: No Does the patient have difficulty seeing, even when wearing glasses/contacts?: No Does the patient have difficulty concentrating, remembering, or making decisions?: No Patient able to express need for assistance with ADLs?: Yes Does the patient have difficulty dressing or bathing?: No Independently performs ADLs?: Yes (appropriate for developmental age) Does the patient have difficulty walking or climbing stairs?: No Weakness of Legs: None Weakness of Arms/Hands: None  Permission Sought/Granted Permission sought to share information with : Investment banker, corporate granted to share info w AGENCY: [place] agencies        Emotional Assessment Appearance:: Appears stated age Attitude/Demeanor/Rapport: Engaged, Gracious Affect (typically observed): Calm, Appropriate Orientation: : Oriented to Self, Oriented to Place, Oriented to  Time, Oriented to Situation Alcohol / Substance Use: Not Applicable Psych Involvement: No (comment)  Admission diagnosis:  Nausea [R11.0] Lower abdominal pain [R10.30] Abdominal pain [R10.9] 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  S/P exploratory laparotomy 10/20/2019  . AF (paroxysmal atrial fibrillation) ([place])   . Abdominal pain 10/17/2019  . 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 Ischemic chest pain ([place])  09/21/2019  . Atrial fibrillation with rapid ventricular response ([place]) 06/15/2010  . PURE HYPERCHOLESTEROLEMIA 06/07/2010  . CORONARY ATHEROSCLEROSIS NATIVE CORONARY ARTERY 06/07/2010  . Hyperlipidemia 06/09/2009  . VENTRICULAR ECTOPY 12/06/2008  . CAD (coronary artery disease) 12/02/2008   PCP:  Baxter Hire, MD Pharmacy:   [facility], [place] AT [facility] [address] [place] [address] Phone: [phone] Fax: [phone]     Social Determinants of Health (SDOH) Interventions    Readmission Risk Interventions No flowsheet data found.

## 2019-10-21 NOTE — Progress Notes (Signed)
[facility] SURGICAL PROGRESS NOTE  Hospital Day(s): 4.   Post op day(s): 3 Days Post-Op.   Interval History:  Patient seen and examined no acute events or new complaints overnight.  Patient reports he is feeling much better, only some incisional soreness No fever, chills, nausea, or emesis Labs are very reassuring HR and rhythm much improved on IV amiodarone Tolerating CLD without issues, + flatus Pending PT evaluation  Vital signs in last 24 hours: [min-max] current  Temp:  [97.5 F (36.4 C)-98.2 F (36.8 C)] 97.7 F (36.5 C) (06/02 0400) Pulse Rate:  [52-84] 54 (06/02 0700) Resp:  [9-24] 15 (06/02 0700) BP: (96-147)/(57-122) 119/60 (06/02 0700) SpO2:  [90 %-97 %] 94 % (06/02 0700)     Height: 5\' 9"  (175.3 cm) Weight: 84.4 kg BMI (Calculated): 27.46   Intake/Output last 2 shifts:  06/01 0701 - 06/02 0700 In: 1303.7 [P.O.:600; I.V.:382.5; IV Piggyback:321.2] Out: 1070 [Urine:875; Drains:195]   Physical Exam:  Constitutional: alert, cooperative and no distress, sitting in chair eating breakfast Respiratory: breathing non-labored at rest  Cardiovascular: Bradycardia to 58, regular rhythm  Gastrointestinal: Soft, incisional soreness, and non-distended, no rebound/guaridng, JP in RLQ with serosanguinous output Integumentary: Prevena to midline incision   Labs:  CBC Latest Ref Rng & Units 10/21/2019 10/20/2019 10/19/2019  WBC 4.0 - 10.5 K/uL 7.5 10.7(H) 19.1(H)  Hemoglobin 13.0 - 17.0 g/dL 10.6(L) 10.3(L) 10.7(L)  Hematocrit 39.0 - 52.0 % 30.5(L) 31.3(L) 31.2(L)  Platelets 150 - 400 K/uL 135(L) 132(L) 141(L)   CMP Latest Ref Rng & Units 10/21/2019 10/20/2019 10/19/2019  Glucose 70 - 99 mg/dL 99 73 10/21/2019)  BUN 8 - 23 mg/dL 21 614(E) 31(V)  Creatinine 0.61 - 1.24 mg/dL 40(G 8.67 6.19  Sodium 135 - 145 mmol/L 140 143 140  Potassium 3.5 - 5.1 mmol/L 4.0 4.2 4.2  Chloride 98 - 111 mmol/L 105 108 105  CO2 22 - 32 mmol/L 28 29 27   Calcium 8.9 - 10.3 mg/dL 7.8(L)  7.9(L) 8.0(L)  Total Protein 6.5 - 8.1 g/dL - - -  Total Bilirubin 0.3 - 1.2 mg/dL - - -  Alkaline Phos 38 - 126 U/L - - -  AST 15 - 41 U/L - - -  ALT 0 - 44 U/L - - -     Imaging studies: No new pertinent imaging studies   Assessment/Plan:  86 y.o. male doing remarkably well with improvement in heart rate and rhythm 3 Days Post-Op s/p small bowel anastomosis and abdominal closure following exploratory laparotomy with small bowel resection (70 cm) and abthera vac placement on 05/29   - Advance to full liquids for breakfast, if tolerating well okay for soft diet tonight  - Wean from IVF resuscitation  - Continue IV ABx (Zosyn)  - Pain control prn; antiemetics prn  - monitor abdominal examination; on-going bowel function  - Continue Prevena vac; typically at least 7 days  - Continue blake drain; monitor output   - Appreciate cardiology assistance; transition to PO antiarrhythmics when indicated    - Transfer to floor once ready  - mobilize with PT  All of the above findings and recommendations were discussed with the patient, and the medical team, and all of patient's questions were answered to his expressed satisfaction.  -- 91, PA-C [facility] 10/21/2019, 8:35 AM [phone] M-F: 7am - 4pm

## 2019-10-21 NOTE — Progress Notes (Signed)
Pt pleasant and slept well through the night. On amiodarone gtt during the night, and had no episodes of Afib.

## 2019-10-21 NOTE — Progress Notes (Signed)
Progress Note  Patient Name: [name] [name] Date of Encounter: 10/21/2019  Primary Cardiologist: Dr. Tenny Craw  Subjective   Patient doing okay, denies any symptoms of palpitations or shortness of breath.  Abdomen is a little sore.  Has not had a bowel movement, passing gas.  Inpatient Medications    Scheduled Meds: . apixaban  5 mg Oral BID  . Chlorhexidine Gluconate Cloth  6 each Topical Daily  . ketorolac  15 mg Intravenous Q6H  . metoprolol tartrate  12.5 mg Oral BID  . pantoprazole (PROTONIX) IV  40 mg Intravenous QHS   Continuous Infusions: . sodium chloride    . amiodarone 30 mg/hr (10/21/19 1257)  . piperacillin-tazobactam (ZOSYN)  IV 12.5 mL/hr at 10/21/19 0800   PRN Meds: HYDROmorphone (DILAUDID) injection, metoprolol tartrate, ondansetron **OR** ondansetron (ZOFRAN) IV, polyethylene glycol   Vital Signs    Vitals:   10/21/19 0800 10/21/19 0900 10/21/19 1000 10/21/19 1100  BP:   117/75   Pulse: (!) 56 61 (!) 53 (!) 53  Resp: 16 15 15 11   Temp: 98 F (36.7 C)  98.3 F (36.8 C)   TempSrc: Oral  Oral   SpO2: 96% 99% 96% 98%  Weight:      Height:        Intake/Output Summary (Last 24 hours) at 10/21/2019 1319 Last data filed at 10/21/2019 0800 Gross per 24 hour  Intake 1212.71 ml  Output 795 ml  Net 417.71 ml   Last 3 Weights 10/17/2019 10/17/2019 09/28/2019  Weight (lbs) 186 lb 1.1 oz 172 lb 172 lb 6.4 oz  Weight (kg) 84.4 kg 78.019 kg 78.2 kg      Telemetry    Sinus bradycardia, heart rate 55- Personally Reviewed  ECG    No new ECG obtained- Personally Reviewed  Physical Exam   GEN: No acute distress.   Neck: No JVD Cardiac:  Regular, bradycardic, systolic murmur Respiratory: Clear to auscultation bilaterally. GI: Soft, tender, surgical dressing noted MS: No edema; No deformity. Neuro:  Nonfocal  Psych: Normal affect   Labs    High Sensitivity Troponin:   Recent Labs  Lab 10/17/19 0442 10/17/19 0629 10/20/19 0339 10/20/19 0618    TROPONINIHS 9 10 13 12       Chemistry Recent Labs  Lab 10/17/19 0442 10/17/19 1930 10/19/19 0408 10/20/19 0339 10/21/19 0458  NA 140   < > 140 143 140  K 4.0   < > 4.2 4.2 4.0  CL 101   < > 105 108 105  CO2 26   < > 27 29 28   GLUCOSE 175*   < > 127* 73 99  BUN 15   < > 28* 24* 21  CREATININE 0.84   < > 0.71 0.82 0.79  CALCIUM 9.6   < > 8.0* 7.9* 7.8*  PROT 7.2  --   --   --   --   ALBUMIN 4.1  --   --   --   --   AST 29  --   --   --   --   ALT 29  --   --   --   --   ALKPHOS 62  --   --   --   --   BILITOT 1.3*  --   --   --   --   GFRNONAA >60   < > >60 >60 >60  GFRAA >60   < > >60 >60 >60  ANIONGAP 13   < >  8 6 7    < > = values in this interval not displayed.     Hematology Recent Labs  Lab 10/19/19 0408 10/20/19 0339 10/21/19 0458  WBC 19.1* 10.7* 7.5  RBC 3.09* 3.03* 3.10*  HGB 10.7* 10.3* 10.6*  HCT 31.2* 31.3* 30.5*  MCV 101.0* 103.3* 98.4  MCH 34.6* 34.0 34.2*  MCHC 34.3 32.9 34.8  RDW 12.1 11.9 11.9  PLT 141* 132* 135*    BNP Recent Labs  Lab 10/20/19 0339  BNP 459.3*     DDimer No results for input(s): DDIMER in the last 168 hours.   Radiology    DG Chest Port 1 View  Result Date: 10/20/2019 CLINICAL DATA:  Arrhythmia. Coronary artery disease. Aortic stenosis. EXAM: PORTABLE CHEST 1 VIEW COMPARISON:  10/19/2019.  09/21/2019. FINDINGS: Mediastinum and hilar structures are normal. Stable cardiomegaly. No pulmonary venous congestion. Stable bilateral interstitial prominence. Chronic interstitial lung disease could present in this fashion. Active interstitial process including pneumonitis/interstitial edema cannot be excluded. No pleural effusion or pneumothorax. Surgical clips upper abdomen. IMPRESSION: 1.  Stable cardiomegaly.  No pulmonary venous congestion. 2. Stable bilateral interstitial prominence. Chronic interstitial lung disease could present this fashion. An active interstitial process including pneumonitis/interstitial edema cannot be  excluded. Similar findings noted on prior exams. Electronically Signed   By: 11/21/2019  Register   On: 10/20/2019 05:48   DG Chest Port 1 View  Result Date: 10/19/2019 CLINICAL DATA:  Follow-up of abnormal chest x-ray on 09/21/2019 EXAM: PORTABLE CHEST 1 VIEW COMPARISON:  Chest x-rays dated 09/21/2019 and 07/07/2005 and chest CT dated 09/21/2019 FINDINGS: There is chronic accentuation of the interstitial markings peripherally in both lungs, worse on the left than the right. This is demonstrated to be chronic interstitial disease on the prior chest CT dated 09/21/2019. No acute infiltrates or effusions. Heart size and pulmonary vascularity are normal. No significant bone abnormality. IMPRESSION: 1. No acute abnormality. 2. Chronic interstitial lung disease. The appearance is similar to the recent prior exams. Electronically Signed   By: 11/21/2019 M.D.   On: 10/19/2019 17:51    Cardiac Studies   2D echo 09/21/2019: 1. Left ventricular ejection fraction, by estimation, is 50 to 55%. The  left ventricle has low normal function. The left ventricle has no regional  wall motion abnormalities. There is mild left ventricular hypertrophy.  Left ventricular diastolic parameters are consistent with Grade I diastolic dysfunction (impaired  relaxation).  2. Right ventricular systolic function is normal. The right ventricular  size is normal. Tricuspid regurgitation signal is inadequate for assessing  PA pressure.  3. Left atrial size was mildly dilated.  4. The mitral valve is abnormal. Mild mitral valve regurgitation. No  evidence of mitral stenosis.  5. The aortic valve is abnormal. Aortic valve regurgitation is not  visualized. Moderate aortic valve stenosis. Aortic valve mean gradient  measures 13.3 mmHg. Aortic valve peak gradient measures 21.8 mmHg. Aortic valve area, by VTI measures 1.41 cm.  Lexiscan MPI 09/21/2019:  T wave inversion was noted during stress in the III leads.  There was no  ST segment deviation noted during stress.  Defect 1: There is a medium defect of severe severity present in the mid inferolateral and apical lateral location.  Findings consistent with prior myocardial infarction with mild peri-infarct ischemia.  This is an intermediate risk study.  Nuclear stress EF: 50%.  Patient Profile     86 y.o. male with history of CAD, hypertension, hyperlipidemia, presenting with an ischemic bowel status post  resection.  Postop course complicated by A. fib with rapid ventricular response.  Assessment & Plan    1.  Paroxysmal atrial fibrillation -Currently in sinus rhythm on amiodarone drip -Continue amiodarone drip for now.  Plan to switch to p.o. amiodarone after patient having bowel movements or upon d/c On Eliquis 5 mg twice daily  2.  History of CAD -Currently asymptomatic -Toprol-XL, Crestor  Total encounter time 35 minutes  Greater than 50% was spent in counseling and coordination of care with the patient       Signed, Kate Sable, MD  10/21/2019, 1:19 PM

## 2019-10-22 LAB — PHOSPHORUS: Phosphorus: 3.7 mg/dL (ref 2.5–4.6)

## 2019-10-22 NOTE — Progress Notes (Signed)
Progress Note  Patient Name: [name] [name] Date of Encounter: 10/22/2019  Primary Cardiologist: Tenny Craw  Subjective   Feels well this afternoon. No chest pain, SOB, or palpitations. Positive flatus. No bowel movement. Maintaining sinus rhythm.   Inpatient Medications    Scheduled Meds: . apixaban  5 mg Oral BID  . Chlorhexidine Gluconate Cloth  6 each Topical Daily  . ketorolac  15 mg Intravenous Q6H  . metoprolol tartrate  12.5 mg Oral BID  . pantoprazole (PROTONIX) IV  40 mg Intravenous QHS   Continuous Infusions: . sodium chloride    . amiodarone 30 mg/hr (10/22/19 0105)  . piperacillin-tazobactam (ZOSYN)  IV 3.375 g (10/22/19 0513)   PRN Meds: HYDROmorphone (DILAUDID) injection, metoprolol tartrate, ondansetron **OR** ondansetron (ZOFRAN) IV, polyethylene glycol   Vital Signs    Vitals:   10/22/19 0300 10/22/19 0400 10/22/19 0500 10/22/19 0600  BP: (!) 126/51 (!) 128/54 138/70 125/61  Pulse: (!) 57 (!) 59 67 (!) 55  Resp: 14 12 15 14   Temp:      TempSrc:      SpO2: 96% 98% 95% 95%  Weight:      Height:        Intake/Output Summary (Last 24 hours) at 10/22/2019 1250 Last data filed at 10/22/2019 0930 Gross per 24 hour  Intake 440.03 ml  Output 1340 ml  Net -899.97 ml   Filed Weights   10/17/19 0435 10/17/19 1426  Weight: 78 kg 84.4 kg    Telemetry    SR - Personally Reviewed  ECG    No new tracings - Personally Reviewed  Physical Exam   GEN: No acute distress.   Neck: No JVD. Cardiac: RRR, II/VI systolic murmur at the RUSB, no rubs, or gallops.  Respiratory: Clear to auscultation bilaterally.  GI: Soft, nontender, non-distended.   MS: No edema; No deformity. Neuro:  Alert and oriented x 3; Nonfocal.  Psych: Normal affect.  Labs    Chemistry Recent Labs  Lab 10/17/19 0442 10/17/19 1930 10/19/19 0408 10/20/19 0339 10/21/19 0458  NA 140   < > 140 143 140  K 4.0   < > 4.2 4.2 4.0  CL 101   < > 105 108 105  CO2 26   < > 27 29 28     GLUCOSE 175*   < > 127* 73 99  BUN 15   < > 28* 24* 21  CREATININE 0.84   < > 0.71 0.82 0.79  CALCIUM 9.6   < > 8.0* 7.9* 7.8*  PROT 7.2  --   --   --   --   ALBUMIN 4.1  --   --   --   --   AST 29  --   --   --   --   ALT 29  --   --   --   --   ALKPHOS 62  --   --   --   --   BILITOT 1.3*  --   --   --   --   GFRNONAA >60   < > >60 >60 >60  GFRAA >60   < > >60 >60 >60  ANIONGAP 13   < > 8 6 7    < > = values in this interval not displayed.     Hematology Recent Labs  Lab 10/19/19 0408 10/20/19 0339 10/21/19 0458  WBC 19.1* 10.7* 7.5  RBC 3.09* 3.03* 3.10*  HGB 10.7* 10.3* 10.6*  HCT 31.2* 31.3*  30.5*  MCV 101.0* 103.3* 98.4  MCH 34.6* 34.0 34.2*  MCHC 34.3 32.9 34.8  RDW 12.1 11.9 11.9  PLT 141* 132* 135*    Cardiac EnzymesNo results for input(s): TROPONINI in the last 168 hours. No results for input(s): TROPIPOC in the last 168 hours.   BNP Recent Labs  Lab 10/20/19 0339  BNP 459.3*     DDimer No results for input(s): DDIMER in the last 168 hours.   Radiology    No results found.  Cardiac Studies   2D echo 09/21/2019: 1. Left ventricular ejection fraction, by estimation, is 50 to 55%. The  left ventricle has low normal function. The left ventricle has no regional  wall motion abnormalities. There is mild left ventricular hypertrophy.  Left ventricular diastolic parameters are consistent with Grade I diastolic dysfunction (impaired  relaxation).  2. Right ventricular systolic function is normal. The right ventricular  size is normal. Tricuspid regurgitation signal is inadequate for assessing  PA pressure.  3. Left atrial size was mildly dilated.  4. The mitral valve is abnormal. Mild mitral valve regurgitation. No  evidence of mitral stenosis.  5. The aortic valve is abnormal. Aortic valve regurgitation is not  visualized. Moderate aortic valve stenosis. Aortic valve mean gradient  measures 13.3 mmHg. Aortic valve peak gradient measures 21.8 mmHg.  Aortic valve area, by VTI measures 1.41 cm.  Lexiscan MPI 09/21/2019:  T wave inversion was noted during stress in the III leads.  There was no ST segment deviation noted during stress.  Defect 1: There is a medium defect of severe severity present in the mid inferolateral and apical lateral location.  Findings consistent with prior myocardial infarction with mild peri-infarct ischemia.  This is an intermediate risk study.  Nuclear stress EF: 50%.  Patient Profile     86 y.o. male with history of CAD with NSTEMI in 2007 as detailed below, moderate aortic stenosis, PVCs, syncope, and HLD who was admitted with ischemic bowel s/p resection and is being seen today for the evaluation of Afib with RVR.  Assessment & Plan    1. PAFwith RVR: -New diagnosis  -Maintaining sinus rhythm on amiodarone gtt, which will be continued until he has a bowel movement, at which time he can be transitioned to PO amiodarone, ideally for a short course followed tapering off and resumption of beta blocker  -Eliquis 5 mg bid (he does not meet reduced dosing criteria) -CHADS2VASc at least 4 (HTN, age x 2, vascular disease)  2. CAD involving the native coronary arteries without angina: -No symptoms suggestive of angina -On Eliquis in place of ASA -Toprol XL -Crestor   3. Moderate aortic stenosis: -Outpatient follow-up  4.  HTN: -BP well controlled currently  -Softer BP readings likely in the setting of IV amiodarone   5.  HLD: -Most recent LDL of 92 from 09/2019 with goal being less than 70 at that time, patient was transitioned from simvastatin to Crestor which should be resumed prior to discharge -He will need follow-up fasting lipid and liver function as an outpatient in 11/2019  6. Anemia: -Stable -Likely postoperative    For questions or updates, please contact [place] Please consult [email or web address] for contact info under Cardiology/STEMI.    Signed, Christell Faith, PA-C [place] Pager: [phone] 10/22/2019, 12:50 PM

## 2019-10-22 NOTE — Progress Notes (Signed)
10/22/2019  Subjective: Patient is 4 Days Post-Op s/p exlap, small bowel resection for internal hernia resulting in necrotic bowel.  Patient continues to be on amiodarone drip and his heart rate remains in NSR.  Patient now on soft diet.  Continues having flatus, but no bowel movement yet.  Denies any distention or nausea.  Vital signs: Temp:  [98.2 F (36.8 C)-98.7 F (37.1 C)] 98.2 F (36.8 C) (06/03 0000) Pulse Rate:  [50-72] 55 (06/03 0600) Resp:  [10-20] 14 (06/03 0600) BP: (96-139)/(50-88) 125/61 (06/03 0600) SpO2:  [95 %-98 %] 95 % (06/03 0600)   Intake/Output: 06/02 0701 - 06/03 0700 In: 589.1 [P.O.:240; I.V.:182.9; IV Piggyback:166.1] Out: 1340 [Urine:1130; Drains:210]    Physical Exam: Constitutional:  No acute distress Abdomen:  Soft, non-distended, appropriately sore to palpation.  Midline incision with Prevena vac covering, with good seal.  Blake drain in RLQ with serosanguinous fluid.  Labs:  Recent Labs    10/20/19 0339 10/21/19 0458  WBC 10.7* 7.5  HGB 10.3* 10.6*  HCT 31.3* 30.5*  PLT 132* 135*   Recent Labs    10/20/19 0339 10/21/19 0458  NA 143 140  K 4.2 4.0  CL 108 105  CO2 29 28  GLUCOSE 73 99  BUN 24* 21  CREATININE 0.82 0.79  CALCIUM 7.9* 7.8*   Recent Labs    10/20/19 0339  LABPROT 13.6  INR 1.1    Imaging: No results found.  Assessment/Plan: This is a 86 y.o. male s/p exlap and small bowel resection.  --Continue amiodarone drip per cardiology recommendations.  Transition to po at their discretion.  Appreciate their assistance. --Continue soft diet as tolerated.  Patient having flatus and is not distended. --Continue IV Zosyn and will transition to po Augmentin on discharge. --Continue Blake drain as it's still too high volumen --Prevena vac to remain in place for total 7 days. --Ok to transfer to floor when off the amiodarone drip.   Howie Ill, MD  [facility]

## 2019-10-22 NOTE — TOC Progression Note (Signed)
Transition of Care [facility]) - Progression Note    Patient Details  Name: AYUSH BOULET MRN: [id number] Date of Birth: 07-19-1933  [facility]) CM/SW Contact  Liliana Cline, LCSW Phone Number: 10/22/2019, 2:25 PM  Clinical Narrative:   [facility] Barbara Cower reported they cannot accommodate patient for HHPT due to staffing. Reached out to [facility] Representative Brittney and asked if she is able to accommodate patient.Waiting to hear back.    Expected Discharge Plan: Home w Home Health Services Barriers to Discharge: Continued Medical Work up  Expected Discharge Plan and Services Expected Discharge Plan: Home w Home Health Services     Post Acute Care Choice: Home Health Living arrangements for the past 2 months: Single Family Home                           HH Arranged: PT HH Agency: [facility] Date HH Agency Contacted: 10/21/19   Representative spoke with at [facility] Agency: Barbara Cower   Social Determinants of Health (SDOH) Interventions    Readmission Risk Interventions No flowsheet data found.

## 2019-10-22 NOTE — Progress Notes (Signed)
Received from ICU. Condition stable. No changes in initial am assessment at this time.Wond vac in place JP in place and charged. Cont with plan of care

## 2019-10-22 NOTE — Progress Notes (Addendum)
Attempted to call report to RN on 2A Wadie Lessen, RN). RN declined report. Pt taken to floor stable and RN notified of arrival.

## 2019-10-22 NOTE — Progress Notes (Signed)
Pt found with infiltrated PIV. Amiodarone running through the line. Left arm red, swollen and painful. IV medication stopped and and line removed. Warm compress applied and new IV started.

## 2019-10-23 ENCOUNTER — Encounter: Payer: Self-pay | Admitting: Physician Assistant

## 2019-10-23 LAB — PHOSPHORUS: Phosphorus: 3.5 mg/dL (ref 2.5–4.6)

## 2019-10-23 MED ORDER — POLYETHYLENE GLYCOL 3350 17 G PO PACK: 17.0000 g | PACK | Freq: Every day | ORAL | Status: DC

## 2019-10-23 MED ORDER — APIXABAN 5 MG PO TABS: 5.0000 mg | ORAL_TABLET | Freq: Two times a day (BID) | ORAL | 11 refills | Status: DC

## 2019-10-23 MED ORDER — AMIODARONE HCL 400 MG PO TABS: 400.0000 mg | ORAL_TABLET | Freq: Two times a day (BID) | ORAL | 1 refills | Status: DC

## 2019-10-23 MED ORDER — POLYETHYLENE GLYCOL 3350 17 G PO PACK: 17.0000 g | PACK | Freq: Every day | ORAL | 0 refills | Status: DC

## 2019-10-23 MED ORDER — OXYCODONE HCL 5 MG PO TABS: 5.0000 mg | ORAL_TABLET | ORAL | Status: DC | PRN

## 2019-10-23 MED ORDER — AMIODARONE HCL 200 MG PO TABS
400.0000 mg | ORAL_TABLET | Freq: Two times a day (BID) | ORAL | Status: DC
  Administered 2019-10-23: 400 mg via ORAL
  Filled 2019-10-23 (×2): qty 2

## 2019-10-23 MED ORDER — ACETAMINOPHEN 500 MG PO TABS: 1000.0000 mg | ORAL_TABLET | Freq: Four times a day (QID) | ORAL | Status: DC | PRN

## 2019-10-23 MED ORDER — OXYCODONE HCL 5 MG PO TABS: 5.0000 mg | ORAL_TABLET | ORAL | 0 refills | Status: DC | PRN

## 2019-10-23 MED ORDER — ACETAMINOPHEN 500 MG PO TABS: 1000.0000 mg | ORAL_TABLET | Freq: Four times a day (QID) | ORAL | 0 refills | Status: DC | PRN

## 2019-10-23 MED ORDER — AMOXICILLIN-POT CLAVULANATE 875-125 MG PO TABS: 1.0000 | ORAL_TABLET | Freq: Two times a day (BID) | ORAL | 0 refills | Status: AC

## 2019-10-23 NOTE — Progress Notes (Signed)
Progress Note  Patient Name: [name] [name] Date of Encounter: 10/23/2019  Primary Cardiologist: Harrington Challenger  Subjective   Feels well this afternoon. No chest pain, SOB, or palpitations. Positive flatus. No bowel movement, though feels like he needs to have one. Maintaining sinus rhythm.   Inpatient Medications    Scheduled Meds: . apixaban  5 mg Oral BID  . Chlorhexidine Gluconate Cloth  6 each Topical Daily  . metoprolol tartrate  12.5 mg Oral BID  . pantoprazole (PROTONIX) IV  40 mg Intravenous QHS   Continuous Infusions: . sodium chloride    . amiodarone 30 mg/hr (10/23/19 0205)  . piperacillin-tazobactam (ZOSYN)  IV 3.375 g (10/23/19 0652)   PRN Meds: acetaminophen, HYDROmorphone (DILAUDID) injection, metoprolol tartrate, ondansetron **OR** ondansetron (ZOFRAN) IV, oxyCODONE, polyethylene glycol   Vital Signs    Vitals:   10/22/19 1400 10/22/19 1451 10/22/19 1956 10/23/19 0358  BP: 97/61 128/82 (!) 146/71 139/73  Pulse: 62 (!) 59 (!) 58 65  Resp: 18 18 20 20   Temp:  97.8 F (36.6 C) (!) 97.3 F (36.3 C) 98 F (36.7 C)  TempSrc:  Oral Oral Oral  SpO2: 98% 100% 99% 95%  Weight:  79.3 kg  77.8 kg  Height:  5\' 7"  (1.702 m)      Intake/Output Summary (Last 24 hours) at 10/23/2019 0722 Last data filed at 10/23/2019 0404 Gross per 24 hour  Intake 722.93 ml  Output 740 ml  Net -17.07 ml   Filed Weights   10/17/19 1426 10/22/19 1451 10/23/19 0358  Weight: 84.4 kg 79.3 kg 77.8 kg    Telemetry    SR - Personally Reviewed  ECG    No new tracings - Personally Reviewed  Physical Exam   GEN: No acute distress.   Neck: No JVD. Cardiac: RRR, II/VI systolic murmur at the RUSB, no rubs, or gallops.  Respiratory: Clear to auscultation bilaterally.  GI: Soft, nontender, non-distended.   MS: No edema; No deformity. Neuro:  Alert and oriented x 3; Nonfocal.  Psych: Normal affect.  Labs    Chemistry Recent Labs  Lab 10/17/19 0442 10/17/19 1930 10/19/19 0408  10/20/19 0339 10/21/19 0458  NA 140   < > 140 143 140  K 4.0   < > 4.2 4.2 4.0  CL 101   < > 105 108 105  CO2 26   < > 27 29 28   GLUCOSE 175*   < > 127* 73 99  BUN 15   < > 28* 24* 21  CREATININE 0.84   < > 0.71 0.82 0.79  CALCIUM 9.6   < > 8.0* 7.9* 7.8*  PROT 7.2  --   --   --   --   ALBUMIN 4.1  --   --   --   --   AST 29  --   --   --   --   ALT 29  --   --   --   --   ALKPHOS 62  --   --   --   --   BILITOT 1.3*  --   --   --   --   GFRNONAA >60   < > >60 >60 >60  GFRAA >60   < > >60 >60 >60  ANIONGAP 13   < > 8 6 7    < > = values in this interval not displayed.     Hematology Recent Labs  Lab 10/19/19 0408 10/20/19 4481 10/21/19 8563  WBC 19.1* 10.7* 7.5  RBC 3.09* 3.03* 3.10*  HGB 10.7* 10.3* 10.6*  HCT 31.2* 31.3* 30.5*  MCV 101.0* 103.3* 98.4  MCH 34.6* 34.0 34.2*  MCHC 34.3 32.9 34.8  RDW 12.1 11.9 11.9  PLT 141* 132* 135*    Cardiac EnzymesNo results for input(s): TROPONINI in the last 168 hours. No results for input(s): TROPIPOC in the last 168 hours.   BNP Recent Labs  Lab 10/20/19 0339  BNP 459.3*     DDimer No results for input(s): DDIMER in the last 168 hours.   Radiology    No results found.  Cardiac Studies   2D echo 09/21/2019: 1. Left ventricular ejection fraction, by estimation, is 50 to 55%. The  left ventricle has low normal function. The left ventricle has no regional  wall motion abnormalities. There is mild left ventricular hypertrophy.  Left ventricular diastolic parameters are consistent with Grade I diastolic dysfunction (impaired  relaxation).  2. Right ventricular systolic function is normal. The right ventricular  size is normal. Tricuspid regurgitation signal is inadequate for assessing  PA pressure.  3. Left atrial size was mildly dilated.  4. The mitral valve is abnormal. Mild mitral valve regurgitation. No  evidence of mitral stenosis.  5. The aortic valve is abnormal. Aortic valve regurgitation is not    visualized. Moderate aortic valve stenosis. Aortic valve mean gradient  measures 13.3 mmHg. Aortic valve peak gradient measures 21.8 mmHg. Aortic valve area, by VTI measures 1.41 cm.  Lexiscan MPI 09/21/2019:  T wave inversion was noted during stress in the III leads.  There was no ST segment deviation noted during stress.  Defect 1: There is a medium defect of severe severity present in the mid inferolateral and apical lateral location.  Findings consistent with prior myocardial infarction with mild peri-infarct ischemia.  This is an intermediate risk study.  Nuclear stress EF: 50%.  Patient Profile     86 y.o. male with history of CAD with NSTEMI in 2007 as detailed below, moderate aortic stenosis, PVCs, syncope, and HLD who was admitted with ischemic bowel s/p resection and is being seen today for the evaluation of Afib with RVR.  Assessment & Plan    1. PAFwith RVR: -New diagnosis  -Maintaining sinus rhythm on amiodarone gtt -Transition IV amiodarone to PO amiodarone 400 mg bid x 1 wee, 200 mg bid x 1 week then 200 mg daily thereafter, ideally for a short course followed tapering off -Eliquis 5 mg bid (he does not meet reduced dosing criteria) -CHADS2VASc at least 4 (HTN, age x 2, vascular disease)  2. CAD involving the native coronary arteries without angina: -No symptoms suggestive of angina -On Eliquis in place of ASA -Toprol XL -Crestor   3. Moderate aortic stenosis: -Outpatient follow-up  4.  HTN: -BP well controlled currently  -Softer BP readings likely in the setting of IV amiodarone   5.  HLD: -Most recent LDL of 92 from 09/2019 with goal being less than 70 at that time, patient was transitioned from simvastatin to Crestor which should be resumed prior to discharge -He will need follow-up fasting lipid and liver function as an outpatient in 11/2019  6. Anemia: -Stable -Likely postoperative    For questions or updates, please contact [facility] Please consult [email or web address] for contact info under Cardiology/STEMI.    Signed, Eula Listen, PA-C [facility] Pager: [phone] 10/23/2019, 7:22 AM

## 2019-10-23 NOTE — Care Management Important Message (Signed)
Important Message  Patient Details  Name: [name] [name] MRN: [id number] Date of Birth: 02-21-1934   Medicare Important Message Given:  Yes     [name] 10/23/2019, 2:47 PM

## 2019-10-23 NOTE — Progress Notes (Signed)
Floor received call from outpatient pharmacy indicating they did not have amiodarone 400 mg tabs. Advised to fill amiodarone 200 mg 2 po bid x 1 week, then 200 mg bid x 1 week, then 200 mg daily thereafter until he is seen in follow up.

## 2019-10-23 NOTE — Discharge Summary (Signed)
[facility] SURGICAL DISCHARGE SUMMARY  Patient ID: [name] [name] MRN: [id number] DOB/AGE: 1933/06/11 86 y.o.  Admit date: 10/17/2019 Discharge date: 10/23/2019  Discharge Diagnoses Patient Active Problem List   Diagnosis Date Noted  . S/P exploratory laparotomy 10/20/2019  . AF (paroxysmal atrial fibrillation) ([place])   . Abdominal pain 10/17/2019  . 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 Atrial fibrillation with rapid ventricular response ([place]) 06/15/2010    Consultants Cardiology Medicine  Procedures 10/17/2019:  Exploratory Laparotomy, small bowel resection of 70 cm, negative pressure wound vac placement > 50 cm.  HPI: [name] [name] is a 86 y.o. male presenting with a 2-day history of abdominal pain associated with nausea and dry heaving.  Patient reports that yesterday he started having some mild discomfort in the mid abdomen and was having some mild nausea as well.  The day before he was feeling perfectly fine.  Then last night he woke up around 1 in the morning with acute onset of worsening abdominal pain associated with nausea and dry heaving.  He denies any fevers but reports having some chills and sweats.  Denies any chest pain or shortness of breath.  He did have a bowel movement yesterday as well as flatus but nothing so far today overnight.  He denies eating anything out of the ordinary and denies any sick contacts.  He presents to the emergency room earlier this morning and part of his work-up included laboratory analysis which showed a WBC of 12.1, hemoglobin 13.9, troponin of 9, creatinine 0.84.  He also had a CT scan which I have independently viewed.  This shows mildly dilated thick-walled small bowel loops in the right abdomen associated with mesenteric edema and stranding.  Although there is no pneumatosis or abscess the concern from this is for an internal hernia.  Hospital Course: Informed consent was obtained and  documented, and patient underwent exploratory laparotomy with resection of gangrenous small bowel (~70 cm) and placement of Abthera Vac (Dr Hampton Abbot, 10/17/2019).  Post-operatively, he remained intubated and sedated with plans for takeback the following day. He underwent take back, with additional small bowel resection (~15 cm), anastomosis, and abdominal closure (Dr Hampton Abbot, 10/18/2019). He was extubated and off vasopressor sup[port by POD1. NGT was removed as well as bowel function returned. On POD2, he developed atrial fibrillation with RVR and cardiology was consulted. He was started on amiodarone. Over the course of the next 2 hospital days he ultimately did well, heart rate returned to NSR, and diet was advanced without issues. He was transferred to the floor on 06/03 and all antiarrhythmics were switched to PO on 06/04. The remainder of patient's hospital course was essentially unremarkable, and discharge planning was initiated accordingly with patient safely able to be discharged home with appropriate discharge instructions, antibiotics (Augmentin x10 days), pain control, and outpatient follow-up after all of his questions were answered to his expressed satisfaction.   Discharge Condition: Good   Physical Examination:  Constitutional:  No acute distress Abdomen:  Soft, non-distended, appropriately sore to palpation.  Midline incision with Prevena vac covering, with good seal.  Blake drain in RLQ with serosanguinous fluid.   Allergies as of 10/23/2019      Reactions   Other Other (See Comments)   Tetanus shot   Tetanus Toxoid, Adsorbed    Tetanus shot   Tetanus Toxoids    Tetanus shot      Medication List    STOP taking these medications  aspirin EC 81 MG tablet     TAKE these medications   acetaminophen 500 MG tablet Commonly known as: TYLENOL Take 2 tablets (1,000 mg total) by mouth every 6 (six) hours as needed for mild pain or fever.   amiodarone 400 MG tablet Commonly known  as: PACERONE Take 1 tablet (400 mg total) by mouth 2 (two) times daily. For 1 week then 200 mg twice daily for 1 week   amoxicillin-clavulanate 875-125 MG tablet Commonly known as: Augmentin Take 1 tablet by mouth 2 (two) times daily for 10 days.   apixaban 5 MG Tabs tablet Commonly known as: ELIQUIS Take 1 tablet (5 mg total) by mouth 2 (two) times daily.   BEE POLLEN PO Take 1 capsule by mouth daily.   metoprolol succinate 25 MG 24 hr tablet Commonly known as: TOPROL-XL Take 1 tablet (25 mg total) by mouth daily. Please make yearly appt with Dr. Tenny Craw for June for future refills. 1st attempt What changed:   when to take this  additional instructions   nitroGLYCERIN 0.4 MG SL tablet Commonly known as: Nitrostat place 1 tablet under the tongue if needed every 5 minutes for chest pain for 3 doses IF NO RELIEF AFTER 3RD DOSE CALL PRESCRIBER OR 911.   oxyCODONE 5 MG immediate release tablet Commonly known as: Oxy IR/ROXICODONE Take 1 tablet (5 mg total) by mouth every 4 (four) hours as needed for severe pain or breakthrough pain.   polyethylene glycol 17 g packet Commonly known as: MIRALAX / GLYCOLAX Take 17 g by mouth daily.   rosuvastatin 20 MG tablet Commonly known as: CRESTOR Take 1 tablet (20 mg total) by mouth daily.        Follow-up Information    Donovan Kail, PA-C. Schedule an appointment as soon as possible for a visit on 10/27/2019.   Specialty: Physician Assistant Why: s/p exploratory laparotomy, has drain and prevena Contact information: [address] [place] [address] [phone]            Time spent on discharge management including discussion of hospital course, clinical condition, outpatient instructions, prescriptions, and follow up with the patient and members of the medical team: >30 minutes  -- [name] , PA-C [facility]  10/23/2019, 9:02 AM [phone] M-F: 7am - 4pm

## 2019-10-23 NOTE — Plan of Care (Signed)
  Problem: Education: Goal: Knowledge of General Education information will improve Description: Including pain rating scale, medication(s)/side effects and non-pharmacologic comfort measures Outcome: Adequate for Discharge   Problem: Health Behavior/Discharge Planning: Goal: Ability to manage health-related needs will improve Outcome: Adequate for Discharge   Problem: Clinical Measurements: Goal: Ability to maintain clinical measurements within normal limits will improve Outcome: Adequate for Discharge Goal: Will remain free from infection Outcome: Adequate for Discharge Goal: Diagnostic test results will improve Outcome: Adequate for Discharge Goal: Respiratory complications will improve Outcome: Adequate for Discharge Goal: Cardiovascular complication will be avoided Outcome: Adequate for Discharge   Problem: Activity: Goal: Risk for activity intolerance will decrease Outcome: Adequate for Discharge   Problem: Nutrition: Goal: Adequate nutrition will be maintained Outcome: Adequate for Discharge   Problem: Coping: Goal: Level of anxiety will decrease Outcome: Adequate for Discharge   Problem: Elimination: Goal: Will not experience complications related to bowel motility Outcome: Adequate for Discharge Goal: Will not experience complications related to urinary retention Outcome: Adequate for Discharge   Problem: Pain Managment: Goal: General experience of comfort will improve Outcome: Adequate for Discharge   Problem: Safety: Goal: Ability to remain free from injury will improve Outcome: Adequate for Discharge   Problem: Skin Integrity: Goal: Risk for impaired skin integrity will decrease Outcome: Adequate for Discharge   Problem: Acute Rehab PT Goals(only PT should resolve) Goal: Pt Will Go Supine/Side To Sit Outcome: Adequate for Discharge Goal: Pt Will Perform Standing Balance Or Pre-Gait Outcome: Adequate for Discharge Goal: Pt Will Ambulate Outcome:  Adequate for Discharge

## 2019-10-23 NOTE — TOC Progression Note (Addendum)
Transition of Care [facility]) - Progression Note    Patient Details  Name: [name] MRN: [id number] Date of Birth: 30-Aug-1933  [facility] CM/SW Contact  Shawn Route, RN Phone Number: 10/23/2019, 2:41 PM  Clinical Narrative:    Home Health PT will be provided by [facility]. Denied by [facility], [facility], and [facility]   Expected Discharge Plan: Home w Home Health Services Barriers to Discharge: Continued Medical Work up  Expected Discharge Plan and Services Expected Discharge Plan: Home w Home Health Services     Post Acute Care Choice: Home Health Living arrangements for the past 2 months: Single Family Home Expected Discharge Date: 10/23/19                         HH Arranged: PT HH Agency: [facility] Date HH Agency Contacted: 10/21/19   Representative spoke with at [facility] Agency: Barbara Cower   Social Determinants of Health (SDOH) Interventions    Readmission Risk Interventions No flowsheet data found.

## 2019-10-24 ENCOUNTER — Emergency Department
Admission: EM | Admit: 2019-10-24 | Discharge: 2019-10-24 | Disposition: A | Discharge status: Home / Self Care | Payer: Medicare Other | Attending: Emergency Medicine | Admitting: Emergency Medicine

## 2019-10-24 ENCOUNTER — Other Ambulatory Visit: Payer: Self-pay

## 2019-10-24 ENCOUNTER — Encounter: Payer: Self-pay | Admitting: Emergency Medicine

## 2019-10-24 ENCOUNTER — Emergency Department: Payer: Medicare Other

## 2019-10-24 DIAGNOSIS — R109 Unspecified abdominal pain: Secondary | ICD-10-CM | POA: Diagnosis present

## 2019-10-24 DIAGNOSIS — Z5321 Procedure and treatment not carried out due to patient leaving prior to being seen by health care provider: Secondary | ICD-10-CM | POA: Insufficient documentation

## 2019-10-24 LAB — CBC WITH DIFFERENTIAL/PLATELET
Abs Immature Granulocytes: 0.16 10*3/uL — ABNORMAL HIGH (ref 0.00–0.07)
Basophils Absolute: 0.1 10*3/uL (ref 0.0–0.1)
Basophils Relative: 0 %
Eosinophils Absolute: 0.2 10*3/uL (ref 0.0–0.5)
Eosinophils Relative: 2 %
HCT: 38 % — ABNORMAL LOW (ref 39.0–52.0)
Hemoglobin: 12.9 g/dL — ABNORMAL LOW (ref 13.0–17.0)
Immature Granulocytes: 1 %
Lymphocytes Relative: 16 %
Lymphs Abs: 1.8 10*3/uL (ref 0.7–4.0)
MCH: 33.6 pg (ref 26.0–34.0)
MCHC: 33.9 g/dL (ref 30.0–36.0)
MCV: 99 fL (ref 80.0–100.0)
Monocytes Absolute: 1.1 10*3/uL — ABNORMAL HIGH (ref 0.1–1.0)
Monocytes Relative: 9 %
Neutro Abs: 8.1 10*3/uL — ABNORMAL HIGH (ref 1.7–7.7)
Neutrophils Relative %: 72 %
Platelets: 229 10*3/uL (ref 150–400)
RBC: 3.84 MIL/uL — ABNORMAL LOW (ref 4.22–5.81)
RDW: 12.5 % (ref 11.5–15.5)
WBC: 11.4 10*3/uL — ABNORMAL HIGH (ref 4.0–10.5)
nRBC: 0 % (ref 0.0–0.2)

## 2019-10-24 LAB — BASIC METABOLIC PANEL
Anion gap: 11 (ref 5–15)
BUN: 13 mg/dL (ref 8–23)
CO2: 25 mmol/L (ref 22–32)
Calcium: 9 mg/dL (ref 8.9–10.3)
Chloride: 103 mmol/L (ref 98–111)
Creatinine, Ser: 0.73 mg/dL (ref 0.61–1.24)
GFR calc Af Amer: >60 mL/min (ref >60)
GFR calc non Af Amer: >60 mL/min (ref >60)
Glucose, Bld: 107 mg/dL — ABNORMAL HIGH (ref 70–99)
Potassium: 4 mmol/L (ref 3.5–5.1)
Sodium: 139 mmol/L (ref 135–145)

## 2019-10-24 NOTE — ED Triage Notes (Signed)
PT to ED via POV c/o post op complication. Pt states that he was D/C from hospital yesterday around 3pm. Pt had surgery and had part of his intestines removed. Pt sent home with wound vac. Pt states that he emptied it last night, since last night he has not had much output and his line looks clogged. Pt states that Dr. Leland Johns did his surgery. Pt states that he is having some pain in his abdomen.

## 2019-10-24 NOTE — ED Notes (Signed)
This RN spoke with patient and family. Explained spoke with EDP regarding patient's symptoms and after review of chart, EDP placed orders for [place] and they would be coming to get him.

## 2019-10-24 NOTE — ED Notes (Signed)
This RN rounded on patient. Apologized for wait. Pt's family continues to sit with patient.

## 2019-10-24 NOTE — ED Notes (Signed)
Pt states to this RN that he got the clog out of his drain and that it is now working properly. Pt states that his abd pain is better and that he is going to go home. Pt states that he will follow up with his MD.

## 2019-10-28 ENCOUNTER — Other Ambulatory Visit: Payer: Self-pay

## 2019-10-28 ENCOUNTER — Ambulatory Visit (INDEPENDENT_AMBULATORY_CARE_PROVIDER_SITE_OTHER): Payer: Self-pay | Admitting: Physician Assistant

## 2019-10-28 ENCOUNTER — Encounter: Payer: Self-pay | Admitting: Physician Assistant

## 2019-10-28 VITALS — BP 122/50 | HR 85 | Temp 97.9°F | Ht 67.0 in | Wt 168.0 lb

## 2019-10-28 DIAGNOSIS — Z09 Encounter for follow-up examination after completed treatment for conditions other than malignant neoplasm: Secondary | ICD-10-CM

## 2019-10-28 DIAGNOSIS — K461 Unspecified abdominal hernia with gangrene: Secondary | ICD-10-CM

## 2019-10-28 NOTE — Patient Instructions (Signed)
Follow up here on Monday at 1:30 pm.  We have placed steri strips over your wound. These will start to fall of in about 2 weeks.  You may shower, do not submerge the wound.

## 2019-10-28 NOTE — Progress Notes (Signed)
[facility] POST-OP OFFICE VISIT  10/28/2019  HPI: [name] [name] is a 86 y.o. male 11 days s/p small bowel anastomosis and abdominal closure following exploratory laparotomy with small bowel resection (70 cm) and abthera vac placement on 05/29.  Overall doing well He reports occasional abdominal soreness at the incision, controlled with tylenol and ibuprofen No fever, chills, nausea, or emesis He has good PO intake + Bowel function, + BM Mobilizing reasonably well Surgical drain with 80-100 ccs daily, serous No problems with Abx Set to follow up with cardiology in next 2 weeks  Vital signs: BP (!) 122/50   Pulse 85   Temp 97.9 F (36.6 C)   Ht 5\' 7"  (1.702 m)   Wt 168 lb (76.2 kg)   SpO2 100%   BMI 26.31 kg/m    Physical Exam: Constitutional: Well appearing male, NAD Abdomen: Soft, incisional soreness, non-distended, no rebound/guarding, JP drain in RLQ with serous output Skin: Prevena removed, laparotomy incision is well healing with staples, some surrounding ecchymosis, no erythema or drainage  Assessment/Plan: This is a 86 y.o. male 11 days s/p small bowel anastomosis and abdominal closure following exploratory laparotomy with small bowel resection (70 cm) and abthera vac placement on 05/29   - Pain control prn with tylenol/motrin  - complete ABx  - Removed Prevena vac  - Removed staples, replaced with steri-strips  - Replaced JP bulb; continue drain; monitor and record output daily  - Reviewed activity restrictions   - rtc on Monday for possible drain removal   -- Tuesday, PA-C [facility] 10/28/2019, 11:04 AM [phone] M-F: 7am - 4pm

## 2019-11-02 ENCOUNTER — Ambulatory Visit (INDEPENDENT_AMBULATORY_CARE_PROVIDER_SITE_OTHER): Payer: Self-pay | Admitting: Physician Assistant

## 2019-11-02 ENCOUNTER — Other Ambulatory Visit: Payer: Self-pay

## 2019-11-02 ENCOUNTER — Encounter: Payer: Self-pay | Admitting: Physician Assistant

## 2019-11-02 VITALS — BP 129/63 | HR 51 | Temp 97.9°F | Resp 14 | Ht 67.0 in

## 2019-11-02 DIAGNOSIS — Z09 Encounter for follow-up examination after completed treatment for conditions other than malignant neoplasm: Secondary | ICD-10-CM

## 2019-11-02 DIAGNOSIS — K461 Unspecified abdominal hernia with gangrene: Secondary | ICD-10-CM

## 2019-11-02 NOTE — Progress Notes (Signed)
[facility] POST-OP OFFICE VISIT  11/02/2019  HPI: [name] [name] is a 86 y.o. male 16 days s/p small bowel anastomosis and abdominal closurefollowing exploratory laparotomy with small bowel resection (70 cm) and abthera vac placement on 05/29.  At his last post-op visit on 06/09, he was doing very well. His prevena and staples were removed. He still had about 70-80 ccs from his drain, so this stayed in.   Today, he reports that he is doing well. He has intermittent abdominal pains, typically with walking, relived by tylenol. No fever, chills, nausea, or emesis. He does believe he is starting to get constipated. He has tried Miralax daily without significant improvement. Still with flatus. JP drain with <5 ccs daily at this point.   Vital signs: BP 129/63   Pulse (!) 51   Temp 97.9 F (36.6 C)   Resp 14   Ht 5\' 7"  (1.702 m)   SpO2 96%   BMI 26.31 kg/m    Physical Exam: Constitutional: Well appearing male, NAD Abdomen: Soft, non-tender, non-distended, JP in RLQ with serous output Skin: Laparotomy incision is well healed, steri-strips intact, no erythema or drainage   Assessment/Plan: This is a 86 y.o. male 16 days s/p small bowel anastomosis and abdominal closurefollowing exploratory laparotomy with small bowel resection (70 cm) and abthera vac placement on 05/29   - Removed drain at bedside, placed occlusive dressing  - Reviewed wound care  - Removed post-op restrictions  - Recommend added Colace daily with Miralax for bowel movements  - Encouraged continued mobilization  - Follow up in 2 weeks with Dr 6/29    -- Aleen Campi, PA-C [facility] 11/02/2019, 11:10 AM [phone] M-F: 7am - 4pm

## 2019-11-02 NOTE — Patient Instructions (Addendum)
Use Tylenol or Ibuprofen for pain/discomfort.  May use Colace daily to keep your stool soft.  Call us if you do not have a bowel movement in the next 2 days. Or any abdominal pain with vomiting or fevers or chills.    You may shower. You can remove your dressing in 2 days. You may use a Band-Aid over this if needed.   Follow up with Dr Aleen Campi in 2 weeks.

## 2019-11-10 ENCOUNTER — Other Ambulatory Visit: Payer: Self-pay

## 2019-11-10 ENCOUNTER — Ambulatory Visit: Payer: Medicare Other | Admitting: Family

## 2019-11-10 ENCOUNTER — Encounter: Payer: Self-pay | Admitting: Family

## 2019-11-10 VITALS — BP 140/70 | HR 49 | Ht 67.0 in | Wt 162.4 lb

## 2019-11-10 DIAGNOSIS — Z79899 Other long term (current) drug therapy: Secondary | ICD-10-CM

## 2019-11-10 DIAGNOSIS — R001 Bradycardia, unspecified: Secondary | ICD-10-CM

## 2019-11-10 DIAGNOSIS — I4891 Unspecified atrial fibrillation: Secondary | ICD-10-CM | POA: Diagnosis not present

## 2019-11-10 DIAGNOSIS — E782 Mixed hyperlipidemia: Secondary | ICD-10-CM

## 2019-11-10 DIAGNOSIS — I2510 Atherosclerotic heart disease of native coronary artery without angina pectoris: Secondary | ICD-10-CM | POA: Diagnosis not present

## 2019-11-10 DIAGNOSIS — I9789 Other postprocedural complications and disorders of the circulatory system, not elsewhere classified: Secondary | ICD-10-CM

## 2019-11-10 MED ORDER — METOPROLOL SUCCINATE ER 25 MG PO TB24: 12.5000 mg | ORAL_TABLET | Freq: Every day | ORAL | 3 refills | Status: DC

## 2019-11-10 MED ORDER — APIXABAN 5 MG PO TABS: 5.0000 mg | ORAL_TABLET | Freq: Two times a day (BID) | ORAL | 11 refills | Status: DC

## 2019-11-10 MED ORDER — AMIODARONE HCL 200 MG PO TABS: 200.0000 mg | ORAL_TABLET | Freq: Every day | ORAL | 3 refills | Status: DC

## 2019-11-10 NOTE — Patient Instructions (Addendum)
Medication Instructions:  1- DECREASE Toprol to 0.5 tablet (12.5 mg total) once daily *If you need a refill on your cardiac medications before your next appointment, please call your pharmacy*   Lab Work: None ordered If you have labs (blood work) drawn today and your tests are completely normal, you will receive your results only by: Marland Kitchen MyChart Message (if you have MyChart) OR . A paper copy in the mail If you have any lab test that is abnormal or we need to change your treatment, we will call you to review the results.   Testing/Procedures: None ordered   Follow-Up: At [facility], you and your health needs are our priority.  As part of our continuing mission to provide you with exceptional heart care, we have created designated Provider Care Teams.  These Care Teams include your primary Cardiologist (physician) and Advanced Practice Providers (APPs -  Physician Assistants and Nurse Practitioners) who all work together to provide you with the care you need, when you need it.  We recommend signing up for the patient portal called "MyChart".  Sign up information is provided on this After Visit Summary.  MyChart is used to connect with patients for Virtual Visits (Telemedicine).  Patients are able to view lab/test results, encounter notes, upcoming appointments, etc.  Non-urgent messages can be sent to your provider as well.   To learn more about what you can do with MyChart, go to [email or web address].    Your next appointment:  Follow up with Dr. Tenny Craw in aprx 2 month

## 2019-11-10 NOTE — Progress Notes (Signed)
Office Visit    Patient Name: [name] [name] Date of Encounter: 11/10/2019  Primary Care Provider:  Baxter Hire, MD Primary Cardiologist:  No primary care provider on file. Electrophysiologist:  None   Chief Complaint    [name] [name] is a 86 y.o. 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 Bradycardia    relative, precluding up titration of his beta-blocker  . CAD (coronary artery disease)   . HLD (hyperlipidemia)   . Non-ST elevated myocardial infarction (non-STEMI) ([place])    2/07: LHC with occluded OM, LAD 20% + 40%; normal LVF => PTCA of OM with wire passage alone   . PVC's (premature ventricular contractions)   . Syncope    hx   Past Surgical History:  Procedure Laterality Date  . APPLICATION OF WOUND VAC N/A 10/17/2019   Procedure: APPLICATION OF WOUND VAC;  Surgeon: Olean Ree, MD;  Location: [facility];  Service: General;  Laterality: N/A;  [id number]   . APPLICATION OF WOUND VAC  10/18/2019   Procedure: APPLICATION OF WOUND VAC;  Surgeon: Olean Ree, MD;  Location: [facility];  Service: General;;  . BOWEL RESECTION N/A 10/17/2019   Procedure: SMALL BOWEL RESECTION;  Surgeon: Olean Ree, MD;  Location: [facility];  Service: General;  Laterality: N/A;  . BOWEL RESECTION  10/18/2019   Procedure: SMALL BOWEL RESECTION;  Surgeon: Olean Ree, MD;  Location: [facility];  Service: General;;  . CARDIAC CATHETERIZATION    . coronary arteriogram    . EXPLORATORY LAPAROTOMY  1970s   for bleeding peptic ulcer  . LAPAROTOMY N/A 10/17/2019   Procedure: EXPLORATORY LAPAROTOMY;  Surgeon: Olean Ree, MD;  Location: [facility];  Service: General;  Laterality: N/A;  . LAPAROTOMY N/A 10/18/2019   Procedure: EXPLORATORY LAPAROTOMY;  Surgeon: Olean Ree, MD;  Location: [facility];  Service:  General;  Laterality: N/A;  . left ventriculogram    . reperfusion of obtuse marginal with a wife alone      Allergies  Allergies  Allergen Reactions  . Other Other (See Comments)    Tetanus shot  . Tetanus Toxoid, Adsorbed     Tetanus shot  . Tetanus Toxoids     Tetanus shot    History of Present Illness    [name] [name] is a 86 y.o. 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  Cardiac history 06/2005 with NSTEMI.  LHC with occluded LCx otherwise nonobstructive disease in the LAD with 20-40% lesion preserved LV SF.  He reperfusion his OM with wire passage alone and did not require angioplasty or stenting.  Nuclear stress test 2008 showed prior lateral MI with mild peri-infarct ischemia.  Echo 06/2010 LVEF 55%, mild LVH, grade 1 diastolic dysfunction, mild TR.  Holter monitor 2013 sinus rhythm with frequent PVC, couplets, triplets with occasional PAC.  Admitted early 09/2018 with atypical chest pain.  Had sensitive troponin negative x2 with normal BNP.  Echo with LVEF 50 to 55%, mild LVH, no regional wall motion abnormalities, grade 1 diastolic dysfunction, normal RV systolic function and ventricular cavity size, mildly dilated LA mild MR, moderate AS.  Lexiscan MPI was intermediate study with medium size defect of severe severity present in mid inferolateral and apical lateral  election finding consistent with prior MI and mild peri-infarct ischemia.  Was not significantly different compared to stress test 2008.  No further work-up is recommended.  CTA chest negative for PE.  Admitted 10/17/19 with CT scan concern for internal hernia. In OR noted to have 2 feet necrotic bowel and additional 2 feet of ischemic bowel per surgery note. Developed postoperative atrial fibrillation treated with amiodarone.  He was also started on Eliquis.  Reports feeling overall well since discharge.  Has had all of  his drains, tubes, staples removed by surgeon.  Reports feeling overall well.  He has been walking every day for exercise.  Reports no palpitations.  When he was in the hospital could tell he was in atrial fibrillation.  Tells me he could feel his heart beating and changes in breathing.  Has not had this sensation since leaving the hospital.  Reports no chest pain, pressure, tightness.  Reports no shortness breath at rest nor dyspnea on exertion.  Reports no lightheadedness, dizziness, near syncope.  EKGs/Labs/Other Studies Reviewed:   The following studies were reviewed today:  2D echo 09/21/2019: 1. Left ventricular ejection fraction, by estimation, is 50 to 55%. The  left ventricle has low normal function. The left ventricle has no regional  wall motion abnormalities. There is mild left ventricular hypertrophy.  Left ventricular diastolic parameters are consistent with Grade I diastolic dysfunction (impaired  relaxation).   2. Right ventricular systolic function is normal. The right ventricular  size is normal. Tricuspid regurgitation signal is inadequate for assessing  PA pressure.   3. Left atrial size was mildly dilated.   4. The mitral valve is abnormal. Mild mitral valve regurgitation. No  evidence of mitral stenosis.   5. The aortic valve is abnormal. Aortic valve regurgitation is not  visualized. Moderate aortic valve stenosis. Aortic valve mean gradient  measures 13.3 mmHg. Aortic valve peak gradient measures 21.8 mmHg. Aortic valve area, by VTI measures 1.41 cm.   Lexiscan MPI 09/21/2019:  T wave inversion was noted during stress in the III leads.  There was no ST segment deviation noted during stress.  Defect 1: There is a medium defect of severe severity present in the mid inferolateral and apical lateral location.  Findings consistent with prior myocardial infarction with mild peri-infarct ischemia.  This is an intermediate risk study.  Nuclear stress EF:  50%.   EKG:  EKG is  ordered today.  The ekg ordered today demonstrates sinus bradycardia 49 bpm with no acute ST/T wave changes  Recent Labs: 10/17/2019: ALT 29 10/20/2019: B Natriuretic Peptide 459.3; TSH 0.719 10/21/2019: Magnesium 2.0 10/24/2019: BUN 13; Creatinine, Ser 0.73; Hemoglobin 12.9; Platelets 229; Potassium 4.0; Sodium 139  Recent Lipid Panel    Component Value Date/Time   CHOL 174 09/28/2019 1628   TRIG 90 09/28/2019 1628   HDL 66 09/28/2019 1628   CHOLHDL 2.6 09/28/2019 1628   CHOLHDL 2.8 10/27/2018 0819   VLDL 17 10/27/2018 0819   LDLCALC 92 09/28/2019 1628    Home Medications   Current Meds  Medication Sig  . acetaminophen (TYLENOL) 500 MG tablet Take 2 tablets (1,000 mg total) by mouth every 6 (six) hours as needed for mild pain or fever.  Marland Kitchen amiodarone (PACERONE) 200 MG tablet Take 1 tablet (200 mg total) by mouth daily.  Marland Kitchen apixaban (ELIQUIS) 5 MG TABS tablet Take 1 tablet (5 mg total) by mouth 2 (two) times daily.  Marland Kitchen BEE POLLEN PO Take 1 capsule by mouth daily.  Marland Kitchen  metoprolol succinate (TOPROL-XL) 25 MG 24 hr tablet Take 0.5 tablets (12.5 mg total) by mouth daily.  . nitroGLYCERIN (NITROSTAT) 0.4 MG SL tablet place 1 tablet under the tongue if needed every 5 minutes for chest pain for 3 doses IF NO RELIEF AFTER 3RD DOSE CALL PRESCRIBER OR 911.  Marland Kitchen oxyCODONE (OXY IR/ROXICODONE) 5 MG immediate release tablet Take 1 tablet (5 mg total) by mouth every 4 (four) hours as needed for severe pain or breakthrough pain.  . polyethylene glycol (MIRALAX / GLYCOLAX) 17 g packet Take 17 g by mouth daily.  . rosuvastatin (CRESTOR) 20 MG tablet Take 1 tablet (20 mg total) by mouth daily.  . [DISCONTINUED] amiodarone (PACERONE) 200 MG tablet Take 200 mg by mouth 2 (two) times daily.   . [DISCONTINUED] apixaban (ELIQUIS) 5 MG TABS tablet Take 1 tablet (5 mg total) by mouth 2 (two) times daily.  . [DISCONTINUED] metoprolol succinate (TOPROL-XL) 25 MG 24 hr tablet Take 1 tablet (25 mg  total) by mouth daily. Please make yearly appt with Dr. Tenny Craw for June for future refills. 1st attempt (Patient taking differently: Take 25 mg by mouth at bedtime. )    Review of Systems       Review of Systems  Constitutional: Negative for chills, fever and malaise/fatigue.  Cardiovascular: Negative for chest pain, dyspnea on exertion, irregular heartbeat, leg swelling, near-syncope, orthopnea, palpitations and syncope.  Respiratory: Negative for cough, shortness of breath and wheezing.   Gastrointestinal: Negative for melena, nausea and vomiting.  Genitourinary: Negative for hematuria.  Neurological: Negative for dizziness, light-headedness and weakness.   All other systems reviewed and are otherwise negative except as noted above.  Physical Exam    VS:  BP 140/70 (BP Location: Left Arm, Patient Position: Sitting, Cuff Size: Normal)   Pulse (!) 49   Ht 5\' 7"  (1.702 m)   Wt 162 lb 6 oz (73.7 kg)   SpO2 97%   BMI 25.43 kg/m  , BMI Body mass index is 25.43 kg/m. GEN: Well nourished, well developed, in no acute distress. HEENT: normal. Neck: Supple, no JVD, carotid bruits, or masses. Cardiac: RRR, no murmurs, rubs, or gallops. No clubbing, cyanosis, edema.  Radials/DP/PT 2+ and equal bilaterally.  Respiratory:  Respirations regular and unlabored, clear to auscultation bilaterally. GI: Soft, nontender, nondistended MS: No deformity or atrophy. Skin: Warm and dry, no rash. Neuro:  Strength and sensation are intact. Psych: Normal affect.  Assessment & Plan    1. PAF - New diagnosis during recent admission with SBO.  Treated with amiodarone and Eliquis.  Maintaining sinus bradycardia on EKG today.  He has a history of bradycardia.  As he has been started on amiodarone, heart rates appear to be running lower.  Continue amiodarone 200 mg daily.  Reduce Toprol to 12.5 mg daily.    2. On amiodarone therapy - TSH, ALT, AST all normal 10/2019. Consider PFTs if this is decided to be a long  term medication. May be able to eventually titrate off if he maintains sinus rhythm as his atrial fibrillation was in the postoperative setting.  3. Chronic anticoagulation - CHADS2VASc of at least 4.  Continue Eliquis 5 mg twice daily.  Does not meet QT of reduced dosing.  Denies bleeding complications.  10/24/2019 hemoglobin 12.9.  Plan for repeat CBC at follow-up.  4. CAD -stable no anginal symptoms.  No indication for ischemic evaluation this time.  GDMT includes beta-blocker, statin.  No aspirin secondary to [facility].  5. HTN -BP  well controlled continue present antihypertensive regimen.  6. Moderate aortic stenosis - By echo 09/2019.  Asymptomatic with no chest pain, near syncope, shortness of breath.  Continue to monitor clinically.  7. HLD, LDL goal <70 - Lipid/liver function scheduled for 11/2019.  Continue present statin.   Disposition: Follow up in 2 month(s) with Dr. Jillyn Hidden, NP 11/10/2019, 4:49 PM

## 2019-11-16 ENCOUNTER — Ambulatory Visit (INDEPENDENT_AMBULATORY_CARE_PROVIDER_SITE_OTHER): Payer: Self-pay | Admitting: Surgery

## 2019-11-16 ENCOUNTER — Encounter: Payer: Self-pay | Admitting: Surgery

## 2019-11-16 ENCOUNTER — Other Ambulatory Visit: Payer: Self-pay

## 2019-11-16 VITALS — BP 178/73 | HR 118 | Temp 97.7°F | Resp 12 | Ht 67.0 in | Wt 164.8 lb

## 2019-11-16 DIAGNOSIS — Z09 Encounter for follow-up examination after completed treatment for conditions other than malignant neoplasm: Secondary | ICD-10-CM

## 2019-11-16 DIAGNOSIS — K461 Unspecified abdominal hernia with gangrene: Secondary | ICD-10-CM

## 2019-11-16 NOTE — Patient Instructions (Addendum)
Please call our office if you have any questions or concerns.    GENERAL POST-OPERATIVE PATIENT INSTRUCTIONS   WOUND CARE INSTRUCTIONS:  Keep a dry clean dressing on the wound if there is drainage. The initial bandage may be removed after 24 hours.  Once the wound has quit draining you may leave it open to air.  If clothing rubs against the wound or causes irritation and the wound is not draining you may cover it with a dry dressing during the daytime.  Try to keep the wound dry and avoid ointments on the wound unless directed to do so.  If the wound becomes bright red and painful or starts to drain infected material that is not clear, please contact your physician immediately.  If the wound is mildly pink and has a thick firm ridge underneath it, this is normal, and is referred to as a healing ridge.  This will resolve over the next 4-6 weeks.  BATHING: You may shower if you have been informed of this by your surgeon. However, Please do not submerge in a tub, hot tub, or pool until incisions are completely sealed or have been told by your surgeon that you may do so.  DIET:  You may eat any foods that you can tolerate.  It is a good idea to eat a high fiber diet and take in plenty of fluids to prevent constipation.  If you do become constipated you may want to take a mild laxative or take ducolax tablets on a daily basis until your bowel habits are regular.  Constipation can be very uncomfortable, along with straining, after recent surgery.  ACTIVITY:  You are encouraged to cough and deep breath or use your incentive spirometer if you were given one, every 15-30 minutes when awake.  This will help prevent respiratory complications and low grade fevers post-operatively if you had a general anesthetic.  You may want to hug a pillow when coughing and sneezing to add additional support to the surgical area, if you had abdominal or chest surgery, which will decrease pain during these times.  You are  encouraged to walk and engage in light activity for the next two weeks.  You should not lift more than 20 pounds, until 11/29/19 as it could put you at increased risk for complications.  Twenty pounds is roughly equivalent to a plastic bag of groceries. At that time- Listen to your body when lifting, if you have pain when lifting, stop and then try again in a few days. Soreness after doing exercises or activities of daily living is normal as you get back in to your normal routine.  MEDICATIONS:  Try to take narcotic medications and anti-inflammatory medications, such as tylenol, ibuprofen, naprosyn, etc., with food.  This will minimize stomach upset from the medication.  Should you develop nausea and vomiting from the pain medication, or develop a rash, please discontinue the medication and contact your physician.  You should not drive, make important decisions, or operate machinery when taking narcotic pain medication.  SUNBLOCK Use sun block to incision area over the next year if this area will be exposed to sun. This helps decrease scarring and will allow you avoid a permanent darkened area over your incision.  QUESTIONS:  Please feel free to call our office if you have any questions, and we will be glad to assist you. [phone]

## 2019-11-16 NOTE — Progress Notes (Signed)
11/16/2019  HPI: [name] [name] is a 86 y.o. male s/p exploratory laparotomy with small bowel resection and open abdomen, followed by subsequent laparotomy, further bowel resection with primary anastomosis, and abdominal closure on 5/29 and 10/18/19.  In the hospital, he had complication of post-op atrial fibrillation and cardiology has been following patient.  Over the past few weeks, his prevena vac, staples, and Blake drain were removed in office.  Today, he presents for follow up.  He reports that he's been doing well, without any significant pain.  He did have some pain last week, but has been asymptomatic since.  Denies any constipation or diarrhea.  Did have one bout of nausea after drinking Boost shake, but otherwise, no nausea.  Vital signs: BP (!) 178/73   Pulse (!) 118   Temp 97.7 F (36.5 C) (Oral)   Resp 12   Ht 5\' 7"  (1.702 m)   Wt 164 lb 12.8 oz (74.8 kg)   SpO2 95%   BMI 25.81 kg/m    Physical Exam: Constitutional: No acute distress Abdomen:  Soft, non-distended, non-tender to palpation.  Midline incision is healing well, clean, dry, intact.  Prior drain site has healed over.  No evidence of hernia.  Assessment/Plan: This is a 86 y.o. male s/p exploratory laparotomy and small bowel resection.  --Discussed with the patient that he's doing well from the surgical standpoint.  He's now 4 weeks out of surgery and healing well.  May start resuming heavier activities slowly and in two more weeks, may resume all activities as tolerated.   --Recommended that he try other possible supplemental shakes or flavors to find one that does not cause nausea. --Follow up with 91 as needed.   [place], MD [facility]

## 2019-11-26 ENCOUNTER — Other Ambulatory Visit: Payer: Medicare Other

## 2019-11-27 ENCOUNTER — Other Ambulatory Visit: Payer: Medicare Other | Admitting: *Deleted

## 2019-11-27 ENCOUNTER — Other Ambulatory Visit: Payer: Self-pay

## 2019-11-27 DIAGNOSIS — E782 Mixed hyperlipidemia: Secondary | ICD-10-CM

## 2019-11-27 LAB — HEPATIC FUNCTION PANEL
ALT: 20 IU/L (ref 0–44)
AST: 21 IU/L (ref 0–40)
Albumin: 4.4 g/dL (ref 3.6–4.6)
Alkaline Phosphatase: 70 IU/L (ref 48–121)
Bilirubin Total: 0.5 mg/dL (ref 0.0–1.2)
Bilirubin, Direct: 0.17 mg/dL (ref 0.00–0.40)
Total Protein: 6.4 g/dL (ref 6.0–8.5)

## 2019-11-27 LAB — LIPID PANEL
Chol/HDL Ratio: 2.2 ratio (ref 0.0–5.0)
Cholesterol, Total: 148 mg/dL (ref 100–199)
HDL: 66 mg/dL (ref >39)
LDL Chol Calc (NIH): 67 mg/dL (ref 0–99)
Triglycerides: 81 mg/dL (ref 0–149)
VLDL Cholesterol Cal: 15 mg/dL (ref 5–40)

## 2019-12-03 ENCOUNTER — Encounter: Payer: Self-pay | Admitting: *Deleted

## 2019-12-23 ENCOUNTER — Other Ambulatory Visit: Payer: Self-pay | Admitting: Internal Medicine

## 2020-01-17 NOTE — Progress Notes (Signed)
Cardiology Office Note   Date:  01/18/2020   ID:  [name] [name], DOB Nov 28, 1933, MRN [id number]  PCP:  Gracelyn Nurse, MD  Cardiologist:   Dietrich Pates, MD   F/u of CAD     History of Present Illness: [name] [name] is a 86 y.o. male with a history of CAD, s/p NSTEMI . LHC 2/07 demonstrated an occluded OM, mild nonobstructive disease in the LAD with 20 and 40% lesions and preserved LV function. He had return of perfusion in his OM with wire passage alone. No balloon angioplasty or stenting was required.  Nuclear study 4/08: Prior lateral MI with mild peri-infarct ischemia. Echocardiogram 2/12: Mild LVH, EF 50-55%, grade 1 diastolic dysfunction, mild TR. Holter monitor 2/13: Sinus rhythm, frequent PVCs, couplets and triplets, occasional PACs.    I saw the patient in clinic in May 2021  The week prior he had been admitted for chest pain  Myovue was normal   After this visit the patient was admitted at the end of May for abdominal pain.  He went on to have excision of 2 feet of necrotic bowel.  Postoperatively he developed atrial fibrillation.  He was placed on Eliquis.  After discharge he was seen once in clinic by nurse practitioner.  He returns today for follow-up.  On talking to the patient he says his breathing is okay, he denies chest pain, he denies shortness of breath, he denies palpitations.    Outpatient Medications Prior to Visit  Medication Sig Dispense Refill  . amiodarone (PACERONE) 200 MG tablet Take 1 tablet (200 mg total) by mouth daily. 90 tablet 3  . apixaban (ELIQUIS) 5 MG TABS tablet Take 1 tablet (5 mg total) by mouth 2 (two) times daily. 60 tablet 11  . BEE POLLEN PO Take 1 capsule by mouth daily.    . metoprolol succinate (TOPROL-XL) 25 MG 24 hr tablet Take 0.5 tablets (12.5 mg total) by mouth daily. 45 tablet 3  . nitroGLYCERIN (NITROSTAT) 0.4 MG SL tablet place 1 tablet under the tongue if needed every 5 minutes for chest pain for 3 doses IF NO RELIEF  AFTER 3RD DOSE CALL PRESCRIBER OR 911. 25 tablet 2  . rosuvastatin (CRESTOR) 20 MG tablet Take 1 tablet (20 mg total) by mouth daily. 30 tablet 6  . acetaminophen (TYLENOL) 500 MG tablet Take 2 tablets (1,000 mg total) by mouth every 6 (six) hours as needed for mild pain or fever. (Patient not taking: Reported on 01/18/2020) 30 tablet 0  . polyethylene glycol (MIRALAX / GLYCOLAX) 17 g packet Take 17 g by mouth daily. (Patient not taking: Reported on 01/18/2020) 14 each 0   No facility-administered medications prior to visit.     Allergies:   Other; Tetanus toxoid, adsorbed; and Tetanus toxoids   Past Medical History:  Diagnosis Date  . Bradycardia    relative, precluding up titration of his beta-blocker  . CAD (coronary artery disease)   . HLD (hyperlipidemia)   . Non-ST elevated myocardial infarction (non-STEMI) ([facility])    2/07: LHC with occluded OM, LAD 20% + 40%; normal LVF => PTCA of OM with wire passage alone   . PVC's (premature ventricular contractions)   . Syncope    hx    Past Surgical History:  Procedure Laterality Date  . APPLICATION OF WOUND VAC N/A 10/17/2019   Procedure: APPLICATION OF WOUND VAC;  Surgeon: Henrene Dodge, MD;  Location: [facility] OR[facility];  Service: General;  Laterality: N/A;  [id number]   .  APPLICATION OF WOUND VAC  10/18/2019   Procedure: APPLICATION OF WOUND VAC;  Surgeon: Henrene Dodge, MD;  Location: [facility];  Service: General;;  . BOWEL RESECTION N/A 10/17/2019   Procedure: SMALL BOWEL RESECTION;  Surgeon: Henrene Dodge, MD;  Location: [facility];  Service: General;  Laterality: N/A;  . BOWEL RESECTION  10/18/2019   Procedure: SMALL BOWEL RESECTION;  Surgeon: Henrene Dodge, MD;  Location: [facility];  Service: General;;  . CARDIAC CATHETERIZATION    . coronary arteriogram    . EXPLORATORY LAPAROTOMY  1970s   for bleeding peptic ulcer  . LAPAROTOMY N/A 10/17/2019   Procedure: EXPLORATORY LAPAROTOMY;  Surgeon: Henrene Dodge, MD;  Location: [facility];  Service:  General;  Laterality: N/A;  . LAPAROTOMY N/A 10/18/2019   Procedure: EXPLORATORY LAPAROTOMY;  Surgeon: Henrene Dodge, MD;  Location: [facility];  Service: General;  Laterality: N/A;  . left ventriculogram    . reperfusion of obtuse marginal with a wife alone       Social History:  The patient  reports that he has never smoked. He has quit using smokeless tobacco.  His smokeless tobacco use included chew. He reports that he does not drink alcohol and does not use drugs.   Family History:  The patient's family history includes Heart attack in his father and mother.    ROS:  Please see the history of present illness. All other systems are reviewed and  Negative to the above problem except as noted.    PHYSICAL EXAM: VS:  BP (!) 112/56   Pulse (!) 59   Ht 5\' 7"  (1.702 m)   Wt 167 lb 9.6 oz (76 kg)   SpO2 95%   BMI 26.25 kg/m   GEN: Well nourished, well developed, in no acute distress  HEENT: NCAT   Neck: JVP normal no, carotid bruits, Cardiac: RRR;  Gr II/VI systolic murmur LSB / base  No  rubs, or gallops,no edema  Respiratory:  clear to auscultation bilaterally,  GI: soft, nontender, nondistended, + BS  No hepatomegaly  Extremities: No lower extremity edema  EKG:  EKG is not  ordered today.     Lipid Panel    Component Value Date/Time   CHOL 148 11/27/2019 0758   TRIG 81 11/27/2019 0758   HDL 66 11/27/2019 0758   CHOLHDL 2.2 11/27/2019 0758   CHOLHDL 2.8 10/27/2018 0819   VLDL 17 10/27/2018 0819   LDLCALC 67 11/27/2019 0758      Wt Readings from Last 3 Encounters:  01/18/20 167 lb 9.6 oz (76 kg)  11/16/19 164 lb 12.8 oz (74.8 kg)  11/10/19 162 lb 6 oz (73.7 kg)      ASSESSMENT AND PLAN:  1.  History of CAD.  Remote cath in 2007 This showed LCX occlused  Otherwise nonobstructive CAD  His OM was reperfused with wire   No PTCA or stent   Nuclear test in 2008 prior lateral MI mild periinfarct ischemia    Lexiscan in 2020 with similar defect  Min inferolateral and  apical lateral wall.  patient has several instances of chest pain.  Myovue in May 2021 again showed no ischemia   Inferolateral and apical lateral infarct with mild periinfarct ischemia   Pan to follow, continue medical management   3  HL   continue on Crestor  4  AV dz  Echo in May 2021  showed mild to mod aortic stenosis   Follow clinically   WIll get  periodic echoes in the  future  .  5 PAF.  Had atrial fibrillation post op.  Will need to review chart.  Check thyroid function today amiodarone  I have reviewed chart   Plan for short course post op   Will d/c  Follow Rhythm   Keep on Eliquis for now    F/U in late November or early Dec 2021.        F/u in 1 year      Current medicines are reviewed at length with the patient today.  The patient does not have concerns regarding medicines.  Signed, Dietrich Pates, MD  01/18/2020 6:32 PM    [facility] [address] [place], [place], [place]  [address] Phone: [phone]; Fax: [phone]

## 2020-01-18 ENCOUNTER — Other Ambulatory Visit: Payer: Self-pay

## 2020-01-18 ENCOUNTER — Encounter: Payer: Self-pay | Admitting: Internal Medicine

## 2020-01-18 ENCOUNTER — Ambulatory Visit: Payer: Medicare Other | Admitting: Internal Medicine

## 2020-01-18 VITALS — BP 112/56 | HR 59 | Ht 67.0 in | Wt 167.6 lb

## 2020-01-18 DIAGNOSIS — E782 Mixed hyperlipidemia: Secondary | ICD-10-CM

## 2020-01-18 DIAGNOSIS — I2510 Atherosclerotic heart disease of native coronary artery without angina pectoris: Secondary | ICD-10-CM

## 2020-01-18 NOTE — Patient Instructions (Signed)
Medication Instructions:  No changes *If you need a refill on your cardiac medications before your next appointment, please call your pharmacy*   Lab Work: Today: cmet, cbc, lipids, tsh  If you have labs (blood work) drawn today and your tests are completely normal, you will receive your results only by: Marland Kitchen MyChart Message (if you have MyChart) OR . A paper copy in the mail If you have any lab test that is abnormal or we need to change your treatment, we will call you to review the results.   Testing/Procedures: none   Follow-Up: At [facility], you and your health needs are our priority.  As part of our continuing mission to provide you with exceptional heart care, we have created designated Provider Care Teams.  These Care Teams include your primary Cardiologist (physician) and Advanced Practice Providers (APPs -  Physician Assistants and Nurse Practitioners) who all work together to provide you with the care you need, when you need it.  We recommend signing up for the patient portal called "MyChart".  Sign up information is provided on this After Visit Summary.  MyChart is used to connect with patients for Virtual Visits (Telemedicine).  Patients are able to view lab/test results, encounter notes, upcoming appointments, etc.  Non-urgent messages can be sent to your provider as well.   To learn more about what you can do with MyChart, go to [email or web address].    Your next appointment:   6 month(s)  The format for your next appointment:   In Person  Provider:   You may see Dietrich Pates, MD or one of the following Advanced Practice Providers on your designated Care Team:    Tereso Newcomer, PA-C  Chelsea Aus, [place]    Other Instructions

## 2020-01-20 LAB — COMPREHENSIVE METABOLIC PANEL
ALT: 16 IU/L (ref 0–44)
AST: 21 IU/L (ref 0–40)
Albumin/Globulin Ratio: 2.2 (ref 1.2–2.2)
Albumin: 4.4 g/dL (ref 3.6–4.6)
Alkaline Phosphatase: 67 IU/L (ref 48–121)
BUN/Creatinine Ratio: 18 (ref 10–24)
BUN: 16 mg/dL (ref 8–27)
Bilirubin Total: 0.4 mg/dL (ref 0.0–1.2)
CO2: 25 mmol/L (ref 20–29)
Calcium: 9.1 mg/dL (ref 8.6–10.2)
Chloride: 102 mmol/L (ref 96–106)
Creatinine, Ser: 0.91 mg/dL (ref 0.76–1.27)
GFR calc Af Amer: 90 mL/min/{1.73_m2} (ref >59)
GFR calc non Af Amer: 78 mL/min/{1.73_m2} (ref >59)
Globulin, Total: 2 g/dL (ref 1.5–4.5)
Glucose: 99 mg/dL (ref 65–99)
Potassium: 4.2 mmol/L (ref 3.5–5.2)
Sodium: 139 mmol/L (ref 134–144)
Total Protein: 6.4 g/dL (ref 6.0–8.5)

## 2020-01-20 LAB — TSH: TSH: 1.25 u[IU]/mL (ref 0.450–4.500)

## 2020-01-20 LAB — CBC
Hematocrit: 39.1 % (ref 37.5–51.0)
Hemoglobin: 13.3 g/dL (ref 13.0–17.7)
MCH: 33.9 pg — ABNORMAL HIGH (ref 26.6–33.0)
MCHC: 34 g/dL (ref 31.5–35.7)
MCV: 100 fL — ABNORMAL HIGH (ref 79–97)
Platelets: 187 10*3/uL (ref 150–450)
RBC: 3.92 x10E6/uL — ABNORMAL LOW (ref 4.14–5.80)
RDW: 11.5 % — ABNORMAL LOW (ref 11.6–15.4)
WBC: 7.6 10*3/uL (ref 3.4–10.8)

## 2020-01-20 LAB — LIPID PANEL
Chol/HDL Ratio: 2.4 ratio (ref 0.0–5.0)
Cholesterol, Total: 148 mg/dL (ref 100–199)
HDL: 62 mg/dL (ref >39)
LDL Chol Calc (NIH): 62 mg/dL (ref 0–99)
Triglycerides: 138 mg/dL (ref 0–149)
VLDL Cholesterol Cal: 24 mg/dL (ref 5–40)

## 2020-01-20 NOTE — Progress Notes (Signed)
Electrolytes, kidney and liver function are OK Thyroid function is OK CBC is OK Lpids are excellent   Stop amiodarone   SHort course for post op afib   Keep on Eliquis  F/U in clinic in Dec

## 2020-02-01 ENCOUNTER — Telehealth: Payer: Self-pay | Admitting: Internal Medicine

## 2020-02-01 NOTE — Telephone Encounter (Signed)
-----   Message from Pricilla Riffle, MD sent at 01/20/2020  2:43 PM EDT ----- Electrolytes, kidney and liver function are OK Thyroid function is OK CBC is OK Lpids are excellent   Stop amiodarone   SHort course for post op afib   Keep on Eliquis  F/U in clinic in Dec

## 2020-02-01 NOTE — Telephone Encounter (Signed)
Follow up:     Patient returning a call back. Please call patient. 

## 2020-02-01 NOTE — Telephone Encounter (Signed)
Pt aware to stop amiodarone, continue Eliquis. Moved up appointment from Feb to Dec.

## 2020-02-01 NOTE — Telephone Encounter (Signed)
Patient returning call.

## 2020-02-01 NOTE — Telephone Encounter (Signed)
    Patient calling for lab results 

## 2020-03-22 ENCOUNTER — Other Ambulatory Visit: Payer: Self-pay | Admitting: Internal Medicine

## 2020-03-23 NOTE — Telephone Encounter (Signed)
Outpatient Medication Detail   Disp Refills Start End   rosuvastatin (CRESTOR) 20 MG tablet 30 tablet 2 03/22/2020    Sig: TAKE 1 TABLET(20 MG) BY MOUTH DAILY   Sent to pharmacy as: rosuvastatin (CRESTOR) 20 MG tablet   E-Prescribing Status: Receipt confirmed by pharmacy (03/22/2020  4:57 PM EDT)   Pharmacy  [id number] [place], [place] - [address] AT [facility]

## 2020-04-26 ENCOUNTER — Telehealth: Payer: Self-pay | Admitting: Internal Medicine

## 2020-04-26 NOTE — Telephone Encounter (Signed)
Patient wanted to know if he could do his visit 04/28/20 Virtually. The patient hurt his shoulder and does not think he will be able to come in to the office on Thursday. Please advise

## 2020-04-26 NOTE — Telephone Encounter (Signed)
Visit changed to virtual related to patient not being able to drive in for appointment.  Patient aware.

## 2020-04-28 ENCOUNTER — Telehealth: Payer: Self-pay | Admitting: *Deleted

## 2020-04-28 ENCOUNTER — Telehealth (INDEPENDENT_AMBULATORY_CARE_PROVIDER_SITE_OTHER): Payer: Medicare Other | Admitting: Internal Medicine

## 2020-04-28 ENCOUNTER — Telehealth: Payer: Self-pay | Admitting: Radiology

## 2020-04-28 ENCOUNTER — Other Ambulatory Visit: Payer: Self-pay

## 2020-04-28 VITALS — Ht 67.0 in | Wt 172.0 lb

## 2020-04-28 DIAGNOSIS — I4891 Unspecified atrial fibrillation: Secondary | ICD-10-CM | POA: Diagnosis not present

## 2020-04-28 DIAGNOSIS — I9789 Other postprocedural complications and disorders of the circulatory system, not elsewhere classified: Secondary | ICD-10-CM

## 2020-04-28 NOTE — Patient Instructions (Signed)
Medication Instructions:  No changes *If you need a refill on your cardiac medications before your next appointment, please call your pharmacy*   Lab Work: none If you have labs (blood work) drawn today and your tests are completely normal, you will receive your results only by: Marland Kitchen MyChart Message (if you have MyChart) OR . A paper copy in the mail If you have any lab test that is abnormal or we need to change your treatment, we will call you to review the results.   Testing/Procedures: Christena Deem- Long Term Monitor Instructions   Your physician has requested you wear your ZIO patch monitor 14 days.   This is a single patch monitor.  [employer] supplies one patch monitor per enrollment.  Additional stickers are not available.   Please do not apply patch if you will be having a Nuclear Stress Test, Echocardiogram, Cardiac CT, MRI, or Chest Xray during the time frame you would be wearing the monitor. The patch cannot be worn during these tests.  You cannot remove and re-apply the ZIO XT patch monitor.   Your ZIO patch monitor will be sent USPS Priority mail from [facility] directly to your home address. The monitor may also be mailed to a PO BOX if home delivery is not available.   It may take 3-5 days to receive your monitor after you have been enrolled.   Once you have received you monitor, please review enclosed instructions.  Your monitor has already been registered assigning a specific monitor serial # to you.   Applying the monitor   Shave hair from upper left chest.   Hold abrader disc by orange tab.  Rub abrader in 40 strokes over left upper chest as indicated in your monitor instructions.   Clean area with 4 enclosed alcohol pads .  Use all pads to assure are is cleaned thoroughly.  Let dry.   Apply patch as indicated in monitor instructions.  Patch will be place under collarbone on left side of chest with arrow pointing upward.   Rub patch adhesive wings for 2  minutes.Remove white label marked "1".  Remove white label marked "2".  Rub patch adhesive wings for 2 additional minutes.   While looking in a mirror, press and release button in center of patch.  A small green light will flash 3-4 times .  This will be your only indicator the monitor has been turned on.     Do not shower for the first 24 hours.  You may shower after the first 24 hours.   Press button if you feel a symptom. You will hear a small click.  Record Date, Time and Symptom in the Patient Log Book.   When you are ready to remove patch, follow instructions on last 2 pages of Patient Log Book.  Stick patch monitor onto last page of Patient Log Book.   Place Patient Log Book in [place] box.  Use locking tab on box and tape box closed securely.  The [employer] and [employer] has [employer] on it.  Please place in mailbox as soon as possible.  Your physician should have your test results approximately 7 days after the monitor has been mailed back to [facility].   Call [facility] Customer Care at [phone] if you have questions regarding your ZIO XT patch monitor.  Call them immediately if you see an orange light blinking on your monitor.   If your monitor falls off in less than 4 days contact our Monitor  department at [phone].  If your monitor becomes loose or falls off after 4 days call [employer] at [phone] for suggestions on securing your monitor.    Follow-Up: At [facility], you and your health needs are our priority.  As part of our continuing mission to provide you with exceptional heart care, we have created designated Provider Care Teams.  These Care Teams include your primary Cardiologist (physician) and Advanced Practice Providers (APPs -  Physician Assistants and Nurse Practitioners) who all work together to provide you with the care you need, when you need it.  We recommend signing up for the patient portal called "MyChart".  Sign up information is provided  on this After Visit Summary.  MyChart is used to connect with patients for Virtual Visits (Telemedicine).  Patients are able to view lab/test results, encounter notes, upcoming appointments, etc.  Non-urgent messages can be sent to your provider as well.   To learn more about what you can do with MyChart, go to [email or web address].    Your next appointment:   3 month(s)  The format for your next appointment:   In Person  Provider:   You may see Dr. Dietrich Pates or one of the following Advanced Practice Providers on your designated Care Team:    Tereso Newcomer, PA-C  Chelsea Aus, [place]    Other Instructions

## 2020-04-28 NOTE — Progress Notes (Signed)
Virtual Visit via Telephone Note   This visit type was conducted due to national recommendations for restrictions regarding the COVID-19 Pandemic (e.g. social distancing) in an effort to limit this patient's exposure and mitigate transmission in our community.  Due to his co-morbid illnesses, this patient is at least at moderate risk for complications without adequate follow up.  This format is felt to be most appropriate for this patient at this time.  The patient did not have access to video technology/had technical difficulties with video requiring transitioning to audio format only (telephone).  All issues noted in this document were discussed and addressed.  No physical exam could be performed with this format.  Please refer to the patient's chart for his  consent to telehealth for [facility].    Date:  04/28/2020   ID:  [name] [name], DOB 03/22/1934, MRN [id number] The patient was identified using 2 identifiers.  Patient Location: Home Provider Location: Office/Clinic  PCP:  Gracelyn Nurse, MD  Cardiologist:  Tenny Craw Evaluation Performed:  Follow-Up Visit  Chief Complaint:  F/U of CAD and PAF  History of Present Illness:    [name] [name] is a 86 y.o. male with   CAD, s/p NSTEMI . LHC 2/07 demonstrated an occluded OM, mild nonobstructive disease in the LAD with 20 and 40% lesions and preserved LV function. He had return of perfusion in his OM with wire passage alone. No balloon angioplasty or stenting was required.  Nuclear study 4/08: Prior lateral MI with mild peri-infarct ischemia. Echocardiogram 2/12: Mild LVH, EF 50-55%, grade 1 diastolic dysfunction, mild TR. Holter monitor 2/13: Sinus rhythm, frequent PVCs, couplets and triplets, occasional PACs.    I saw the patient in clinic in May 2021  The week prior he had been admitted for chest pain  Myovue was normal   After this visit the patient was admitted at the end of May for abdominal pain.  He went on to have  excision of 2 feet of necrotic bowel.  Postoperatively he developed atrial fibrillation.  He was placed on Eliquis.   Since seen he has had no CP  Breathing has been OK  He did have a epiosded of SOB with walking   Sat down   Has walked since and no problem   Warm  No palpitations   No dizziness   No GI problems     The patient does not have symptoms concerning for COVID-19 infection (fever, chills, cough, or new shortness of breath).    Past Medical History:  Diagnosis Date  . Bradycardia    relative, precluding up titration of his beta-blocker  . CAD (coronary artery disease)   . HLD (hyperlipidemia)   . Non-ST elevated myocardial infarction (non-STEMI) (HCC)    2/07: LHC with occluded OM, LAD 20% + 40%; normal LVF => PTCA of OM with wire passage alone   . PVC's (premature ventricular contractions)   . Syncope    hx   Past Surgical History:  Procedure Laterality Date  . APPLICATION OF WOUND VAC N/A 10/17/2019   Procedure: APPLICATION OF WOUND VAC;  Surgeon: Henrene Dodge, MD;  Location: [facility] ORS;  Service: General;  Laterality: N/A;  [id number]   . APPLICATION OF WOUND VAC  10/18/2019   Procedure: APPLICATION OF WOUND VAC;  Surgeon: Henrene Dodge, MD;  Location: [facility] ORS;  Service: General;;  . BOWEL RESECTION N/A 10/17/2019   Procedure: SMALL BOWEL RESECTION;  Surgeon: Henrene Dodge, MD;  Location: [facility] ORS;  Service: General;  Laterality: N/A;  . BOWEL RESECTION  10/18/2019   Procedure: SMALL BOWEL RESECTION;  Surgeon: Henrene Dodge, MD;  Location: [facility] ORS;  Service: General;;  . CARDIAC CATHETERIZATION    . coronary arteriogram    . EXPLORATORY LAPAROTOMY  1970s   for bleeding peptic ulcer  . LAPAROTOMY N/A 10/17/2019   Procedure: EXPLORATORY LAPAROTOMY;  Surgeon: Henrene Dodge, MD;  Location: [facility] ORS;  Service: General;  Laterality: N/A;  . LAPAROTOMY N/A 10/18/2019   Procedure: EXPLORATORY LAPAROTOMY;  Surgeon: Henrene Dodge, MD;  Location: [facility] ORS;  Service: General;   Laterality: N/A;  . left ventriculogram    . reperfusion of obtuse marginal with a wife alone       Current Meds  Medication Sig  . amiodarone (PACERONE) 200 MG tablet Take 200 mg by mouth daily.  Marland Kitchen apixaban (ELIQUIS) 5 MG TABS tablet Take 1 tablet (5 mg total) by mouth 2 (two) times daily.  Marland Kitchen BEE POLLEN PO Take 1 capsule by mouth daily.  . metoprolol succinate (TOPROL-XL) 25 MG 24 hr tablet Take 0.5 tablets (12.5 mg total) by mouth daily.  . nitroGLYCERIN (NITROSTAT) 0.4 MG SL tablet place 1 tablet under the tongue if needed every 5 minutes for chest pain for 3 doses IF NO RELIEF AFTER 3RD DOSE CALL PRESCRIBER OR 911.  . rosuvastatin (CRESTOR) 20 MG tablet TAKE 1 TABLET(20 MG) BY MOUTH DAILY     Allergies:   Other; Tetanus toxoid, adsorbed; and Tetanus toxoids   Social History   Tobacco Use  . Smoking status: Never Smoker  . Smokeless tobacco: Former Neurosurgeon    Types: Engineer, drilling  . Vaping Use: Never used  Substance Use Topics  . Alcohol use: No    Alcohol/week: 0.0 standard drinks  . Drug use: No     Family Hx: The patient's family history includes Heart attack in his father and mother.  ROS:   Please see the history of present illness.     All other systems reviewed and are negative.   Prior CV studies:   The following studies were reviewed today: May 2021 Left ventricular ejection fraction, by estimation, is 50 to 55%. The left ventricle has low normal function. The left ventricle has no regional wall motion abnormalities. There is mild left ventricular hypertrophy. Left ventricular diastolic parameters are consistent with Grade I diastolic dysfunction (impaired relaxation). 2. Right ventricular systolic function is normal. The right ventricular size is normal. Tricuspid regurgitation signal is inadequate for assessing PA pressure. 3. Left atrial size was mildly dilated. 4. The mitral valve is abnormal. Mild mitral valve regurgitation. No evidence of mitral  stenosis. 5. The aortic valve is abnormal. Aortic valve regurgitation is not visualized. Moderate aortic valve stenosis. Aortic valve mean gradient measures 13.3 mmHg. Aortic valve peak gradient measures 21.8 mmHg. Aortic valve area, by VTI measures 1.41 cm.   Labs/Other Tests and Data Reviewed:    EKG:  No ECG reviewed.  Recent Labs: 10/20/2019: B Natriuretic Peptide 459.3 10/21/2019: Magnesium 2.0 01/18/2020: ALT 16; BUN 16; Creatinine, Ser 0.91; Hemoglobin 13.3; Platelets 187; Potassium 4.2; Sodium 139; TSH 1.250   Recent Lipid Panel Lab Results  Component Value Date/Time   CHOL 148 01/18/2020 04:41 PM   TRIG 138 01/18/2020 04:41 PM   HDL 62 01/18/2020 04:41 PM   CHOLHDL 2.4 01/18/2020 04:41 PM   CHOLHDL 2.8 10/27/2018 08:19 AM   LDLCALC 62 01/18/2020 04:41 PM    Wt Readings from Last 3  Encounters:  04/28/20 172 lb (78 kg)  01/18/20 167 lb 9.6 oz (76 kg)  11/16/19 164 lb 12.8 oz (74.8 kg)           Objective:    Vital Signs:  Ht 5\' 7"  (1.702 m)   Wt 172 lb (78 kg)   BMI 26.94 kg/m    VITAL SIGNS:  reviewed No vitals  ASSESSMENT & PLAN:    1. CAD .  Remote cath in 2007 This showed LCX occlused  Otherwise nonobstructive CAD  His OM was reperfused with wire   No PTCA or stent   Nuclear test in 2008 prior lateral MI mild periinfarct ischemia    Lexiscan in 2020 with similar defect  Min inferolateral and apical lateral wall.  patient has several instances of chest pain.  Myovue in May 2021 again showed no ischemia   Inferolateral and apical lateral infarct with mild periinfarct ischemia Pt without symptoms to sugg angina   One spell of SOB may have been related to head  Follow   2  PAF   No symptomatic spells of palpitations   Episode of afib when admitted in May with GI problems   Was on amio and Eliquis  Off amiodarone since summer time    Will set up for 2 wk monitor   If no afib then would stop Eliquis   Continue for right now    3  HL  Lipids are very good in Aug  2021   LDL 62  HDL 62  Trig 138    4  AV Dz   Echo in May  Showed mild / mod AS  Will follow up in person in March    Shared Decision Making/Informed Consent        COVID-19 Education: The signs and symptoms of COVID-19 were discussed with the patient and how to seek care for testing (follow up with PCP or arrange E-visit).  The importance of social distancing was discussed today.  Time:   Today, I have spent 20 minutes with the patient with telehealth technology discussing the above problems.     Medication Adjustments/Labs and Tests Ordered: Current medicines are reviewed at length with the patient today.  Concerns regarding medicines are outlined above.   Tests Ordered: No orders of the defined types were placed in this encounter.   Medication Changes: No orders of the defined types were placed in this encounter.   Follow Up:  In Person march 2022  Signed, April 2022, MD  04/28/2020 8:44 AM    [facility]

## 2020-04-28 NOTE — Telephone Encounter (Signed)
  Patient Consent for Virtual Visit         [name] [name] has provided verbal consent on 04/28/2020 for a virtual visit (video or telephone).   CONSENT FOR VIRTUAL VISIT FOR:  [name] [name]  By participating in this virtual visit I agree to the following:  I hereby voluntarily request, consent and authorize [facility] and its employed or contracted physicians, physician assistants, nurse practitioners or other licensed health care professionals (the Practitioner), to provide me with telemedicine health care services (the "Services") as deemed necessary by the treating Practitioner. I acknowledge and consent to receive the Services by the Practitioner via telemedicine. I understand that the telemedicine visit will involve communicating with the Practitioner through live audiovisual communication technology and the disclosure of certain medical information by electronic transmission. I acknowledge that I have been given the opportunity to request an in-person assessment or other available alternative prior to the telemedicine visit and am voluntarily participating in the telemedicine visit.  I understand that I have the right to withhold or withdraw my consent to the use of telemedicine in the course of my care at any time, without affecting my right to future care or treatment, and that the Practitioner or I may terminate the telemedicine visit at any time. I understand that I have the right to inspect all information obtained and/or recorded in the course of the telemedicine visit and may receive copies of available information for a reasonable fee.  I understand that some of the potential risks of receiving the Services via telemedicine include:  Marland Kitchen Delay or interruption in medical evaluation due to technological equipment failure or disruption; . Information transmitted may not be sufficient (e.g. poor resolution of images) to allow for appropriate medical decision making by the Practitioner;  and/or  . In rare instances, security protocols could fail, causing a breach of personal health information.  Furthermore, I acknowledge that it is my responsibility to provide information about my medical history, conditions and care that is complete and accurate to the best of my ability. I acknowledge that Practitioner's advice, recommendations, and/or decision may be based on factors not within their control, such as incomplete or inaccurate data provided by me or distortions of diagnostic images or specimens that may result from electronic transmissions. I understand that the practice of medicine is not an exact science and that Practitioner makes no warranties or guarantees regarding treatment outcomes. I acknowledge that a copy of this consent can be made available to me via my patient portal [facility] MyChart), or I can request a printed copy by calling the office of [facility].    I understand that my insurance will be billed for this visit.   I have read or had this consent read to me. . I understand the contents of this consent, which adequately explains the benefits and risks of the Services being provided via telemedicine.  . I have been provided ample opportunity to ask questions regarding this consent and the Services and have had my questions answered to my satisfaction. . I give my informed consent for the services to be provided through the use of telemedicine in my medical care

## 2020-04-28 NOTE — Telephone Encounter (Signed)
Enrolled patient for a 14 day Zio XT Monitor to be mailed to patients home  

## 2020-05-04 ENCOUNTER — Ambulatory Visit (INDEPENDENT_AMBULATORY_CARE_PROVIDER_SITE_OTHER): Payer: Medicare Other

## 2020-05-04 DIAGNOSIS — I9789 Other postprocedural complications and disorders of the circulatory system, not elsewhere classified: Secondary | ICD-10-CM

## 2020-05-04 DIAGNOSIS — I4891 Unspecified atrial fibrillation: Secondary | ICD-10-CM

## 2020-05-23 ENCOUNTER — Other Ambulatory Visit: Payer: Self-pay | Admitting: Internal Medicine

## 2020-06-03 ENCOUNTER — Telehealth: Payer: Self-pay | Admitting: *Deleted

## 2020-06-03 NOTE — Telephone Encounter (Signed)
Patient has been off amiodarone already. Adv to stop apixaban.  He will increase Toprol XL to 25 mg daily And is aware Dr. Tenny Craw recommends wearing a monitor again in about 4 months to assess PVC burden and r/o afib.

## 2020-06-03 NOTE — Telephone Encounter (Signed)
-----   Message from Pricilla Riffle, MD sent at 06/02/2020  3:34 PM EST ----- No atrial fibrillation    The pt did have frequent PVCs ( 15% total) He should be off of amiodarone   Please confirm OK to take off Eliquis   Resume 81 mg ASA    I would increase toprol XL to 25 mg daily I would also like to get  another monitor in 4 months to reeval PVCs, and r/o afib

## 2020-06-20 ENCOUNTER — Other Ambulatory Visit: Payer: Self-pay | Admitting: Internal Medicine

## 2020-06-21 ENCOUNTER — Other Ambulatory Visit: Payer: Self-pay | Admitting: Internal Medicine

## 2020-06-27 ENCOUNTER — Other Ambulatory Visit: Payer: Self-pay | Admitting: Internal Medicine

## 2020-07-04 ENCOUNTER — Ambulatory Visit: Payer: Medicare Other | Admitting: Internal Medicine

## 2020-10-30 NOTE — Progress Notes (Signed)
Cardiology Office Note   Date:  10/31/2020   ID:  [name] [name], DOB 01-Feb-1934, MRN [id number]  PCP:  Gracelyn Nurse, MD  Cardiologist:   Dietrich Pates, MD   F/u of CAD     History of Present Illness: [name] [name] is a 87 y.o. male with a history of CAD, s/p NSTEMI . LHC 2/07 demonstrated an occluded OM, mild nonobstructive disease in the LAD with 20 and 40% lesions and preserved LV function. He had return of perfusion in his OM with wire passage alone. No balloon angioplasty or stenting was required.   Nuclear study 4/08: Prior lateral MI with mild peri-infarct ischemia. Echocardiogram 2/12: Mild LVH, EF 50-55%, grade 1 diastolic dysfunction, mild TR. Holter monitor 2/13: Sinus rhythm, frequent PVCs, couplets and triplets, occasional PACs.     I saw the patient in clinic in May 2021  The week prior he had been admitted for chest pain  Myovue was normal   After this visit the patient was admitted at the end of May for abdominal pain.  He went on to have excision of 2 feet of necrotic bowel.  Postoperatively he developed atrial fibrillation.  He was placed on Eliquis.  I saw the pt as a televisit in Dec 2021  Monitor in Jan 2022 showed no atrial fibrillation   Frequent PVCs     REcom:  Stop ELiquis  INcrease toprol to 25 mg daiy    The pt has not done this   Still on 1/2 tab of toprol    The pt denies CP   (except for where broke ribs)   Does get occasional SOB with walking   No palpitations  No signif dizziness     Outpatient Medications Prior to Visit  Medication Sig Dispense Refill   BEE POLLEN PO Take 1 capsule by mouth daily.     metoprolol succinate (TOPROL-XL) 25 MG 24 hr tablet Take 0.5 tablets (12.5 mg total) by mouth daily. 45 tablet 3   nitroGLYCERIN (NITROSTAT) 0.4 MG SL tablet place 1 tablet under the tongue if needed every 5 minutes for chest pain for 3 doses IF NO RELIEF AFTER 3RD DOSE CALL PRESCRIBER OR 911. 25 tablet 2   OVER THE COUNTER MEDICATION OMEGA XL 2  CAPSULES DAILY FOR ARTHRITIS IN HANDS AND SHOULDERS     rosuvastatin (CRESTOR) 20 MG tablet TAKE 1 TABLET BY MOUTH DAILY 30 tablet 11   No facility-administered medications prior to visit.     Allergies:   Other; Tetanus toxoid, adsorbed; and Tetanus toxoids   Past Medical History:  Diagnosis Date   Bradycardia    relative, precluding up titration of his beta-blocker   CAD (coronary artery disease)    HLD (hyperlipidemia)    Non-ST elevated myocardial infarction (non-STEMI) (HCC)    2/07: LHC with occluded OM, LAD 20% + 40%; normal LVF => PTCA of OM with wire passage alone    PVC's (premature ventricular contractions)    Syncope    hx    Past Surgical History:  Procedure Laterality Date   APPLICATION OF WOUND VAC N/A 10/17/2019   Procedure: APPLICATION OF WOUND VAC;  Surgeon: Henrene Dodge, MD;  Location: [facility];  Service: General;  Laterality: N/A;  [id number]    APPLICATION OF WOUND VAC  10/18/2019   Procedure: APPLICATION OF WOUND VAC;  Surgeon: Henrene Dodge, MD;  Location: [facility];  Service: General;;   BOWEL RESECTION N/A 10/17/2019   Procedure: SMALL BOWEL RESECTION;  Surgeon: Henrene Dodge, MD;  Location: [facility];  Service: General;  Laterality: N/A;   BOWEL RESECTION  10/18/2019   Procedure: SMALL BOWEL RESECTION;  Surgeon: Henrene Dodge, MD;  Location: [facility];  Service: General;;   CARDIAC CATHETERIZATION     coronary arteriogram     EXPLORATORY LAPAROTOMY  1970s   for bleeding peptic ulcer   LAPAROTOMY N/A 10/17/2019   Procedure: EXPLORATORY LAPAROTOMY;  Surgeon: Henrene Dodge, MD;  Location: [facility];  Service: General;  Laterality: N/A;   LAPAROTOMY N/A 10/18/2019   Procedure: EXPLORATORY LAPAROTOMY;  Surgeon: Henrene Dodge, MD;  Location: [facility];  Service: General;  Laterality: N/A;   left ventriculogram     reperfusion of obtuse marginal with a wife alone       Social History:  The patient  reports that he has never smoked. He has quit using smokeless  tobacco.  His smokeless tobacco use included chew. He reports that he does not drink alcohol and does not use drugs.   Family History:  The patient's family history includes Heart attack in his father and mother.    ROS:  Please see the history of present illness. All other systems are reviewed and  Negative to the above problem except as noted.    PHYSICAL EXAM: VS:  BP 108/70   Pulse 80   Ht 5\' 7"  (1.702 m)   Wt 163 lb (73.9 kg)   SpO2 97%   BMI 25.53 kg/m   GEN: Well nourished, well developed, in no acute distress  HEENT: NCAT   Neck: JVP normal no, no carotid bruits, Cardiac: RRR;  Gr II/VI systolic murmur LSB / base  No  LE edema  Respiratory:  clear to auscultation bilaterally,  GI: soft, nontender, nondistended, + BS  No hepatomegaly  Extremities: No lower extremity edema  EKG:  EKG is   ordered today.    SR 80   Freaquent BPCs     Lipid Panel    Component Value Date/Time   CHOL 148 01/18/2020 1641   TRIG 138 01/18/2020 1641   HDL 62 01/18/2020 1641   CHOLHDL 2.4 01/18/2020 1641   CHOLHDL 2.8 10/27/2018 0819   VLDL 17 10/27/2018 0819   LDLCALC 62 01/18/2020 1641      Wt Readings from Last 3 Encounters:  10/31/20 163 lb (73.9 kg)  04/28/20 172 lb (78 kg)  01/18/20 167 lb 9.6 oz (76 kg)      ASSESSMENT AND PLAN:  1.  History of CAD.  Remote cath in 2007 This showed LCX occlused  Otherwise nonobstructive CAD  His OM was reperfused with wire   No PTCA or stent   Nuclear test in 2008 prior lateral MI mild periinfarct ischemia    Lexiscan in 2020 with similar defect  Min inferolateral and apical lateral wall.  patient has several instances of chest pain.  Myovue in May 2021 again showed no ischemia   Inferolateral and apical lateral infarct with mild periinfarct ischemia   Pan to follow, continue medical management   No complains of CP that sound like angina   3  HL   continue on Crestor  LDL 62   HDL 62    4  AV dz  Echo in May 2021  showed mild to mod aortic  stenosis   Follow clinically   WIll get  periodic echoes in the future  .  Mean gradient in May 2021 was 13 mm Hg    5 PAF. Occurred  after surgery   Monitor in Jan 2022 showsed no afib  Elqius stopped   Did show 15% PVCs   REcomm increase toprol   Will get 2 day monitor in July      F/u in Jan / Feb 2023   Current medicines are reviewed at length with the patient today.  The patient does not have concerns regarding medicines.  Signed, Dietrich Pates, MD  10/31/2020 9:06 AM    [facility] [address] [place], [place], [place]  [address] Phone: [phone]; Fax: [phone]

## 2020-10-31 ENCOUNTER — Ambulatory Visit: Payer: Medicare Other | Admitting: Internal Medicine

## 2020-10-31 ENCOUNTER — Other Ambulatory Visit: Payer: Self-pay

## 2020-10-31 ENCOUNTER — Ambulatory Visit (INDEPENDENT_AMBULATORY_CARE_PROVIDER_SITE_OTHER): Payer: Medicare Other

## 2020-10-31 ENCOUNTER — Encounter: Payer: Self-pay | Admitting: Internal Medicine

## 2020-10-31 VITALS — BP 108/70 | HR 80 | Ht 67.0 in | Wt 163.0 lb

## 2020-10-31 DIAGNOSIS — I4891 Unspecified atrial fibrillation: Secondary | ICD-10-CM

## 2020-10-31 DIAGNOSIS — I9789 Other postprocedural complications and disorders of the circulatory system, not elsewhere classified: Secondary | ICD-10-CM | POA: Diagnosis not present

## 2020-10-31 DIAGNOSIS — R079 Chest pain, unspecified: Secondary | ICD-10-CM

## 2020-10-31 MED ORDER — METOPROLOL SUCCINATE ER 25 MG PO TB24
25.0000 mg | ORAL_TABLET | Freq: Every day | ORAL | 3 refills | Status: DC
Start: 2020-10-31 — End: 2021-10-09

## 2020-10-31 NOTE — Progress Notes (Unsigned)
Patient enrolled for [employer] to mail a 3 day ZIO XT monitor to his home.

## 2020-10-31 NOTE — Patient Instructions (Signed)
Medication Instructions:  Your physician has recommended you make the following change in your medication:  1.) increase metoprolol succinate (Toprol XL) to 25 mg - one tablet daily  *If you need a refill on your cardiac medications before your next appointment, please call your pharmacy*   Lab Work: None   Testing/Procedures: ZIO XT- Long Term Monitor -see instructions below  Follow-Up: At [facility], you and your health needs are our priority.  As part of our continuing mission to provide you with exceptional heart care, we have created designated Provider Care Teams.  These Care Teams include your primary Cardiologist (physician) and Advanced Practice Providers (APPs -  Physician Assistants and Nurse Practitioners) who all work together to provide you with the care you need, when you need it.  We recommend signing up for the patient portal called "MyChart".  Sign up information is provided on this After Visit Summary.  MyChart is used to connect with patients for Virtual Visits (Telemedicine).  Patients are able to view lab/test results, encounter notes, upcoming appointments, etc.  Non-urgent messages can be sent to your provider as well.   To learn more about what you can do with MyChart, go to [email or web address].    Your next appointment:   9 month(s)  The format for your next appointment:   In Person  Provider:   You may see Dr. Dietrich Pates or one of the following Advanced Practice Providers on your designated Care Team:   Tereso Newcomer, PA-C Vin Bhagat, [place]   Other Instructions    PLAN TO Drinda Butts FOR 3 DAYS STARTING LATE JULY ZIO XT- Long Term Monitor Instructions  Your physician has requested you wear a ZIO patch monitor for 3 days.  This is a single patch monitor. [employer] supplies one patch monitor per enrollment. Additional stickers are not available. Please do not apply patch if you will be having a Nuclear Stress Test,  Echocardiogram, Cardiac CT, MRI, or Chest  Xray during the period you would be wearing the  monitor. The patch cannot be worn during these tests. You cannot remove and re-apply the  ZIO XT patch monitor.  Your ZIO patch monitor will be mailed 3 day USPS to your address on file. It may take 3-5 days  to receive your monitor after you have been enrolled.  Once you have received your monitor, please review the enclosed instructions. Your monitor  has already been registered assigning a specific monitor serial # to you.  Billing and Patient Assistance Program Information  We have supplied [employer] with any of your insurance information on file for billing purposes. [employer] offers a sliding scale Patient Assistance Program for patients that do not have  insurance, or whose insurance does not completely cover the cost of the ZIO monitor.  You must apply for the Patient Assistance Program to qualify for this discounted rate.  To apply, please call [employer] at [phone], select option 4, select option 2, ask to apply for  Patient Assistance Program. Meredeth Ide will ask your household income, and how many people  are in your household. They will quote your out-of-pocket cost based on that information.  [employer] will also be able to set up a 21-month, interest-free payment plan if needed.  Applying the monitor   Shave hair from upper left chest.  Hold abrader disc by orange tab. Rub abrader in 40 strokes over the upper left chest as  indicated in your monitor instructions.  Clean area with 4 enclosed alcohol pads. Let dry.  Apply patch as indicated in monitor instructions. Patch will be placed under collarbone on left  side of chest with arrow pointing upward.  Rub patch adhesive wings for 2 minutes. Remove white label marked "1". Remove the white  label marked "2". Rub patch adhesive wings for 2 additional minutes.  While looking in a mirror, press and release button in center of patch. A small green light will  flash 3-4 times. This will  be your only indicator that the monitor has been turned on.  Do not shower for the first 24 hours. You may shower after the first 24 hours.  Press the button if you feel a symptom. You will hear a small click. Record Date, Time and  Symptom in the Patient Logbook.  When you are ready to remove the patch, follow instructions on the last 2 pages of Patient  Logbook. Stick patch monitor onto the last page of Patient Logbook.  Place Patient Logbook in the blue and white box. Use locking tab on box and tape box closed  securely. The blue and white box has prepaid postage on it. Please place it in the mailbox as  soon as possible. Your physician should have your test results approximately 7 days after the  monitor has been mailed back to [facility].  Call [facility] Customer Care at [phone] if you have questions regarding  your ZIO XT patch monitor. Call them immediately if you see an orange light blinking on your  monitor.  If your monitor falls off in less than 4 days, contact our Monitor department at [phone].  If your monitor becomes loose or falls off after 4 days call [employer] at [phone] for  suggestions on securing your monitor

## 2020-12-16 DIAGNOSIS — I4891 Unspecified atrial fibrillation: Secondary | ICD-10-CM

## 2020-12-16 DIAGNOSIS — I9789 Other postprocedural complications and disorders of the circulatory system, not elsewhere classified: Secondary | ICD-10-CM | POA: Diagnosis not present

## 2021-01-30 ENCOUNTER — Other Ambulatory Visit: Payer: Self-pay | Admitting: Family

## 2021-06-06 ENCOUNTER — Telehealth: Payer: Self-pay | Admitting: Internal Medicine

## 2021-06-06 NOTE — Telephone Encounter (Signed)
Pt reports achy pain that started 3 days ago.  Pain is in lower back, left arm at muscle near elbow and left hip. Pt denies SOB, swelling to BLE, CP.  Does not have a blood pressure cuff.  Pt reports a hx of arthritis with steroid shots previously to shoulders.  Pt advised to call PCP as pain does not appear to be cardiac in nature.  Pt reports called today and can't be seen until Feb. Per pt PCP office suggested going to walk in clinic.  Pt does not want to go reports waited for 2 hours and was not seen.  Pt will try topical ointment, ibuprofen, and possibly tylenol.

## 2021-06-06 NOTE — Telephone Encounter (Signed)
[name] [name] is calling reporting he has been having left arm and lower back pain for the last three days. He reports no other symptoms and denies having any fall or picking up something that could have caused this. He mentioned it possibly being arthritis starting to occur or needing to see a neurologist in regards to it, but is requesting Dr. Charlott Rakes opinion first. Please advise.

## 2021-07-21 ENCOUNTER — Other Ambulatory Visit: Payer: Self-pay

## 2021-07-21 MED ORDER — ROSUVASTATIN CALCIUM 20 MG PO TABS: 20.0000 mg | ORAL_TABLET | Freq: Every day | ORAL | 1 refills | Status: DC

## 2021-09-13 ENCOUNTER — Ambulatory Visit: Payer: Medicare Other | Admitting: Internal Medicine

## 2021-10-06 ENCOUNTER — Other Ambulatory Visit: Payer: Self-pay | Admitting: Internal Medicine

## 2021-11-19 IMAGING — DX DG CHEST 1V PORT
1 series · 1 of 1 positions shown · non-contrast
Comparison: Radiograph 07/07/2005

CLINICAL DATA: Left chest pain, upper back pain and pain radiating
down the left arm

EXAM:
PORTABLE CHEST 1 VIEW

[chest ap]
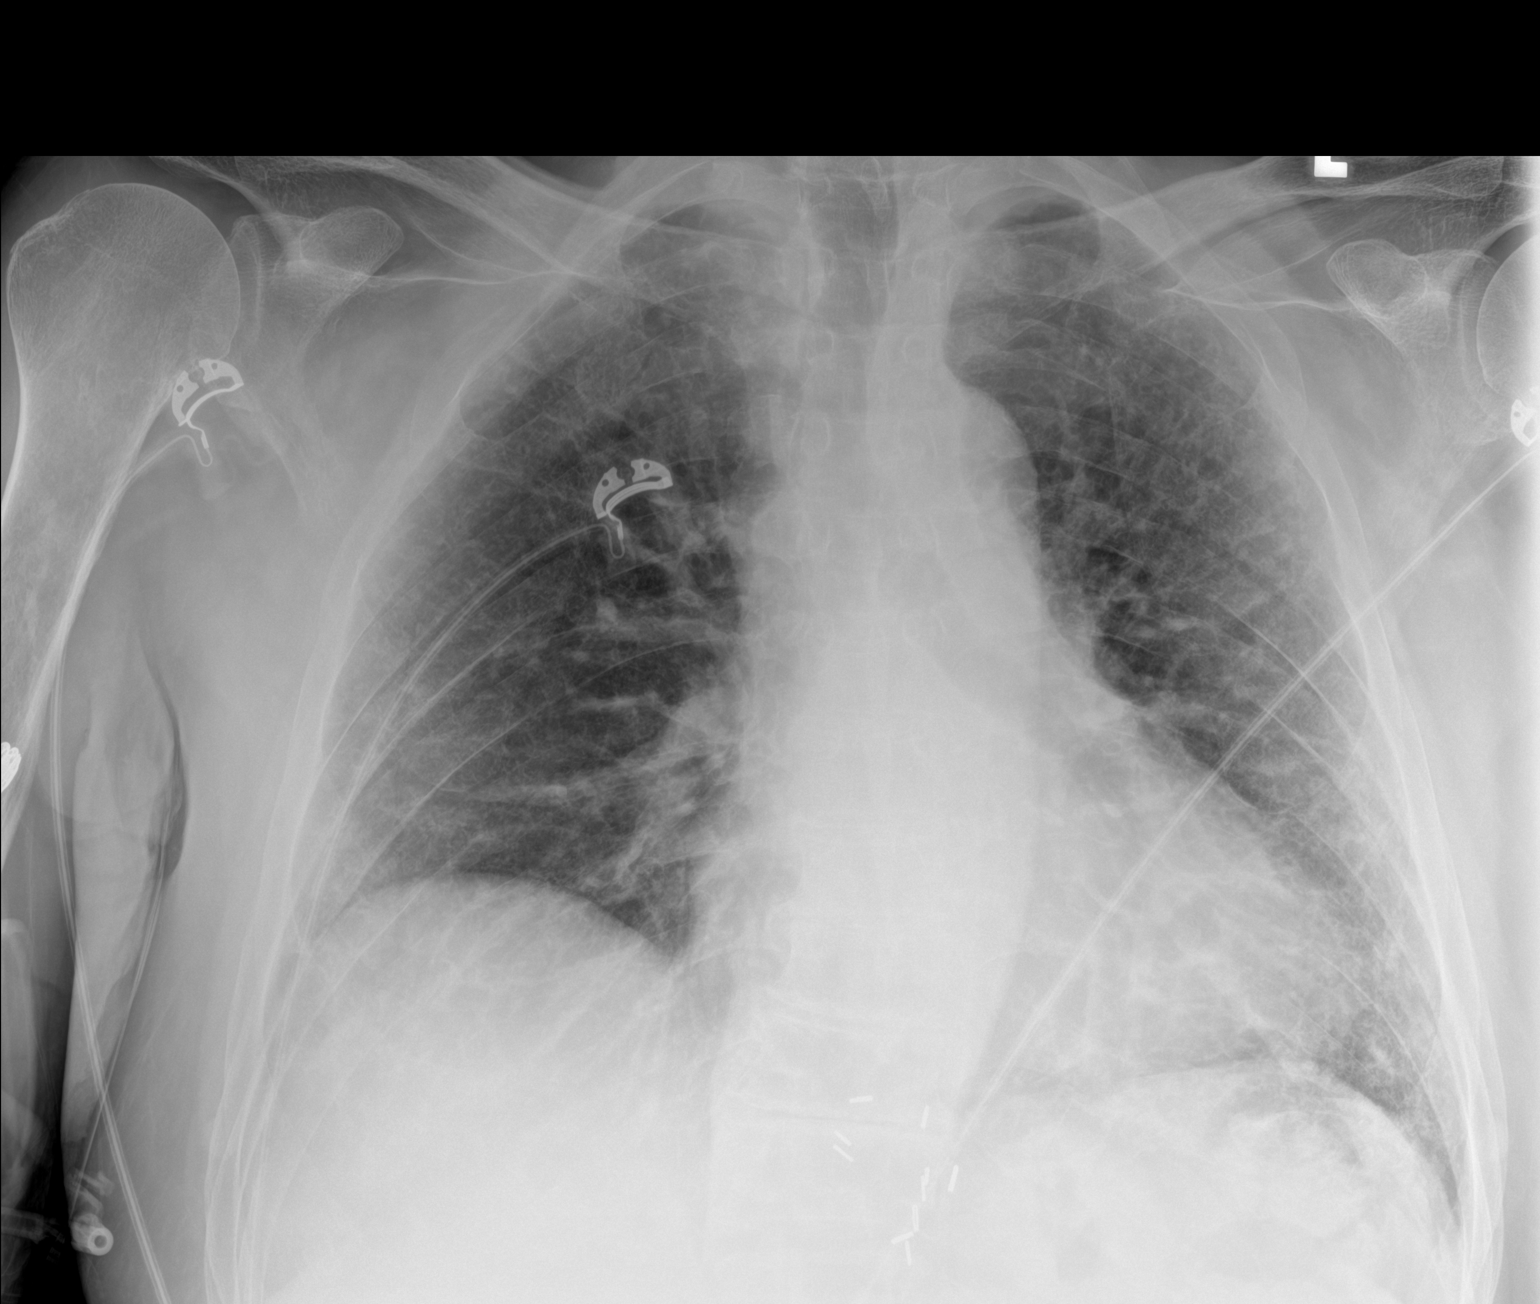

[1 of 1 positions shown; findings below may reference images not displayed]

FINDINGS: There is increased central vascular congestion with diffuse hazy and
reticular interstitial opacities with a slight basilar gradient. No
pneumothorax or effusion. Chronic elevation of the right
hemidiaphragm. Stable cardiomediastinal contours with a calcified
aorta. No acute osseous or soft tissue abnormality. Degenerative
changes are present in the imaged spine and shoulders.
Redemonstration of surgical clips at the GE junction. Telemetry
leads overlie the chest.
IMPRESSION: Hazy and interstitial opacities with vascular congestion likely
reflect edema though atypical infection could have a similar
appearance in the appropriate clinical setting.

## 2021-12-18 NOTE — Progress Notes (Unsigned)
Cardiology Office Note   Date:  12/20/2021   ID:  [name] [name], DOB Aug 01, 1933, MRN [id number]  PCP:  Gracelyn Nurse, MD  Cardiologist:   Dietrich Pates, MD   F/u of CAD     History of Present Illness: [name] [name] is a 88 y.o. male with a history of CAD, s/p NSTEMI . LHC 2/07 demonstrated an occluded OM, mild nonobstructive disease in the LAD with 20 and 40% lesions and preserved LV function. He had return of perfusion in his OM with wire passage alone. No balloon angioplasty or stenting was required.   Nuclear study 4/08: Prior lateral MI with mild peri-infarct ischemia. Echocardiogram 2/12: Mild LVH, EF 50-55%, grade 1 diastolic dysfunction, mild TR. Holter monitor 2/13: Sinus rhythm, frequent PVCs, couplets and triplets, occasional PACs.     I saw the patient in clinic in May 2021  The week prior he had been admitted for chest pain  Myovue was normal   After this visit the patient was admitted at the end of May for abdominal pain.  He went on to have excision of 2 feet of necrotic bowel.  Postoperatively he developed atrial fibrillation.  He was placed on Eliquis.  Monitor in Jan 2022 showed no atrial fibrillation   Frequent PVCs     Recomm that he stop Eliquis    I saw the pt in June 2022 The pt says about 1 month ago he had some SOB   Improved   THinks it was the weather.    He denies CP    No dizzienss   No falls    Pt will feel his heart skip a little when he is in bed Doesn't sleep that well   THis is a chronic problem    Outpatient Medications Prior to Visit  Medication Sig Dispense Refill   BEE POLLEN PO Take 1 capsule by mouth daily.     metoprolol succinate (TOPROL-XL) 25 MG 24 hr tablet TAKE 1 TABLET(25 MG) BY MOUTH DAILY 90 tablet 0   nitroGLYCERIN (NITROSTAT) 0.4 MG SL tablet place 1 tablet under the tongue if needed every 5 minutes for chest pain for 3 doses IF NO RELIEF AFTER 3RD DOSE CALL PRESCRIBER OR 911. 25 tablet 2   OVER THE COUNTER MEDICATION OMEGA XL 2  CAPSULES DAILY FOR ARTHRITIS IN HANDS AND SHOULDERS     rosuvastatin (CRESTOR) 20 MG tablet Take 1 tablet (20 mg total) by mouth daily. Please keep upcoming appt in April 2023 with Dr. Tenny Craw before anymore refills. Thank you 30 tablet 1   No facility-administered medications prior to visit.     Allergies:   Other; Tetanus toxoid, adsorbed; and Tetanus toxoids   Past Medical History:  Diagnosis Date   Bradycardia    relative, precluding up titration of his beta-blocker   CAD (coronary artery disease)    HLD (hyperlipidemia)    Non-ST elevated myocardial infarction (non-STEMI) (HCC)    2/07: LHC with occluded OM, LAD 20% + 40%; normal LVF => PTCA of OM with wire passage alone    PVC's (premature ventricular contractions)    Syncope    hx    Past Surgical History:  Procedure Laterality Date   APPLICATION OF WOUND VAC N/A 10/17/2019   Procedure: APPLICATION OF WOUND VAC;  Surgeon: Henrene Dodge, MD;  Location: [facility];  Service: General;  Laterality: N/A;  [id number]    APPLICATION OF WOUND VAC  10/18/2019   Procedure: APPLICATION OF WOUND VAC;  Surgeon: Henrene Dodge, MD;  Location: [facility];  Service: General;;   BOWEL RESECTION N/A 10/17/2019   Procedure: SMALL BOWEL RESECTION;  Surgeon: Henrene Dodge, MD;  Location: [facility];  Service: General;  Laterality: N/A;   BOWEL RESECTION  10/18/2019   Procedure: SMALL BOWEL RESECTION;  Surgeon: Henrene Dodge, MD;  Location: [facility];  Service: General;;   CARDIAC CATHETERIZATION     coronary arteriogram     EXPLORATORY LAPAROTOMY  1970s   for bleeding peptic ulcer   LAPAROTOMY N/A 10/17/2019   Procedure: EXPLORATORY LAPAROTOMY;  Surgeon: Henrene Dodge, MD;  Location: [facility];  Service: General;  Laterality: N/A;   LAPAROTOMY N/A 10/18/2019   Procedure: EXPLORATORY LAPAROTOMY;  Surgeon: Henrene Dodge, MD;  Location: [facility];  Service: General;  Laterality: N/A;   left ventriculogram     reperfusion of obtuse marginal with a wife alone        Social History:  The patient  reports that he has never smoked. He has quit using smokeless tobacco.  His smokeless tobacco use included chew. He reports that he does not drink alcohol and does not use drugs.   Family History:  The patient's family history includes Heart attack in his father and mother.    ROS:  Please see the history of present illness. All other systems are reviewed and  Negative to the above problem except as noted.    PHYSICAL EXAM: VS:  BP 116/72   Pulse 76   Ht 5\' 7"  (1.702 m)   Wt 157 lb 6.4 oz (71.4 kg)   SpO2 98%   BMI 24.65 kg/m   GEN: Well nourished in no acute distress  HEENT: NCAT   Neck: JVP normal.  No  carotid bruits, Cardiac: RRR;  Gr II/VI systolic murmur LSB / base.  No  LE edema  Respiratory:  clear to auscultation bilaterally,  GI: soft, nontender, nondistended, + BS  No hepatomegaly  Extremities: No lower extremity edema  EKG:  EKG is   ordered today.    SR 76  Occasional PVC     Lipid Panel    Component Value Date/Time   CHOL 148 01/18/2020 1641   TRIG 138 01/18/2020 1641   HDL 62 01/18/2020 1641   CHOLHDL 2.4 01/18/2020 1641   CHOLHDL 2.8 10/27/2018 0819   VLDL 17 10/27/2018 0819   LDLCALC 62 01/18/2020 1641      Wt Readings from Last 3 Encounters:  12/20/21 157 lb 6.4 oz (71.4 kg)  10/31/20 163 lb (73.9 kg)  04/28/20 172 lb (78 kg)      ASSESSMENT AND PLAN:  1.  CAD.  Remote cath in 2007 This showed LCX occlused  Otherwise nonobstructive CAD  His OM was reperfused with wire   No PTCA or stent    Myovue in May 2021 again showed no ischemia   Inferolateral and apical lateral infarct with mild periinfarct ischemia    Patient remains asymptomatic      3  HL   continue on Crestor   Will check lipomed   Last LDL over 70 in Feb    4  AV dz  Echo in May 2021  showed mild to mod aortic stenosis   Will repeat    5 PAF. Occurred after surgery   Monitor in Jan 2022 showsed no afib  Elqius stopped     6  PVCs   Monitor in  Aug 2022 showed SR with 13% PVCs  Pt asymptomatic  Keep on Toprol XL     F/u in Jan / Feb 2023   Current medicines are reviewed at length with the patient today.  The patient does not have concerns regarding medicines.  Signed, Dietrich Pates, MD  12/20/2021 8:18 AM    [facility] [address] [place], [place], [place]  [address] Phone: [phone]; Fax: [phone]

## 2021-12-20 ENCOUNTER — Encounter: Payer: Self-pay | Admitting: Internal Medicine

## 2021-12-20 ENCOUNTER — Ambulatory Visit: Payer: Medicare Other | Admitting: Internal Medicine

## 2021-12-20 VITALS — BP 116/72 | HR 76 | Ht 67.0 in | Wt 157.4 lb

## 2021-12-20 DIAGNOSIS — E782 Mixed hyperlipidemia: Secondary | ICD-10-CM | POA: Diagnosis not present

## 2021-12-20 DIAGNOSIS — I350 Nonrheumatic aortic (valve) stenosis: Secondary | ICD-10-CM

## 2021-12-20 NOTE — Patient Instructions (Signed)
Medication Instructions:   *If you need a refill on your cardiac medications before your next appointment, please call your pharmacy*   Lab Work: NMR If you have labs (blood work) drawn today and your tests are completely normal, you will receive your results only by: MyChart Message (if you have MyChart) OR A paper copy in the mail If you have any lab test that is abnormal or we need to change your treatment, we will call you to review the results.   Testing/Procedures: Your physician has requested that you have an echocardiogram. Echocardiography is a painless test that uses sound waves to create images of your heart. It provides your doctor with information about the size and shape of your heart and how well your heart's chambers and valves are working. This procedure takes approximately one hour. There are no restrictions for this procedure.    Follow-Up: At [facility], you and your health needs are our priority.  As part of our continuing mission to provide you with exceptional heart care, we have created designated Provider Care Teams.  These Care Teams include your primary Cardiologist (physician) and Advanced Practice Providers (APPs -  Physician Assistants and Nurse Practitioners) who all work together to provide you with the care you need, when you need it.  We recommend signing up for the patient portal called "MyChart".  Sign up information is provided on this After Visit Summary.  MyChart is used to connect with patients for Virtual Visits (Telemedicine).  Patients are able to view lab/test results, encounter notes, upcoming appointments, etc.  Non-urgent messages can be sent to your provider as well.   To learn more about what you can do with MyChart, go to [email or web address].    Your next appointment:   1 year(s)  The format for your next appointment:   In Person  DR PAULA ROSS  If primary card or EP is not listed click here to update    :1}    Other  Instructions   Important Information About Sugar

## 2021-12-21 LAB — NMR, LIPOPROFILE
Cholesterol, Total: 151 mg/dL (ref 100–199)
HDL Particle Number: 32.5 umol/L (ref >=30.5)
HDL-C: 57 mg/dL (ref >39)
LDL Particle Number: 677 nmol/L (ref <1000)
LDL Size: 20.9 nm (ref >20.5)
LDL-C (NIH Calc): 74 mg/dL (ref 0–99)
LP-IR Score: 26 (ref <=45)
Small LDL Particle Number: 224 nmol/L (ref <=527)
Triglycerides: 111 mg/dL (ref 0–149)

## 2021-12-22 ENCOUNTER — Telehealth: Payer: Self-pay

## 2021-12-22 MED ORDER — ROSUVASTATIN CALCIUM 40 MG PO TABS: 40.0000 mg | ORAL_TABLET | Freq: Every day | ORAL | 3 refills | Status: DC

## 2021-12-22 NOTE — Telephone Encounter (Signed)
Pt advised and will double up what he has and will pick up new Rx at the pharmacy when he is ready.

## 2021-12-22 NOTE — Telephone Encounter (Signed)
-----   Message from Dietrich Pates V, MD sent at 12/21/2021  3:15 PM EDT ----- LIpids are pretty good   I would increase Crestor to 40 mg to get a little tighter control of LDL   Curr at 74   Goal below 70

## 2021-12-27 ENCOUNTER — Ambulatory Visit (HOSPITAL_COMMUNITY): Payer: Medicare Other | Attending: Internal Medicine

## 2021-12-27 ENCOUNTER — Telehealth: Payer: Self-pay

## 2021-12-27 DIAGNOSIS — E782 Mixed hyperlipidemia: Secondary | ICD-10-CM | POA: Insufficient documentation

## 2021-12-27 DIAGNOSIS — I350 Nonrheumatic aortic (valve) stenosis: Secondary | ICD-10-CM | POA: Diagnosis not present

## 2021-12-27 DIAGNOSIS — Z79899 Other long term (current) drug therapy: Secondary | ICD-10-CM

## 2021-12-27 LAB — ECHOCARDIOGRAM COMPLETE
AR max vel: 0.95 cm2
AV Area VTI: 0.95 cm2
AV Area mean vel: 0.86 cm2
AV Mean grad: 22 mmHg
AV Peak grad: 35 mmHg
Ao pk vel: 2.96 m/s
Area-P 1/2: 3.29 cm2
S' Lateral: 2.8 cm

## 2021-12-27 NOTE — Telephone Encounter (Signed)
Echo and NMR for Nov 2023 ordered.

## 2021-12-27 NOTE — Telephone Encounter (Signed)
-----   Message from Pricilla Riffle, MD sent at 12/27/2021  4:46 PM EDT ----- Reviewed with patient    I think echo shows LVEF to be a little better than reported       The aortic valve is at least moderate /severe to severe  He denies SOB unless he is working hard   has not slowed   Denies CP  No dizziness I would recomm a repeat echo end of November 2023     We discussed lipomed panel   He admits to eating sweets   Will work harder   LDL near optimal      Repeat lipomed when he returns for echo

## 2022-01-10 ENCOUNTER — Other Ambulatory Visit: Payer: Self-pay | Admitting: Internal Medicine

## 2022-01-15 ENCOUNTER — Telehealth: Payer: Self-pay | Admitting: Internal Medicine

## 2022-01-15 NOTE — Telephone Encounter (Signed)
Patient has echo and lipo med panel scheduled in November.  I sent a message to [employer] to see if they have any information then we can call the patient back.

## 2022-01-15 NOTE — Telephone Encounter (Signed)
Patient states his insurance denial the test/procedure that Dr. Tenny Craw wanted to do. Please advise

## 2022-01-19 NOTE — Telephone Encounter (Signed)
From the looks of it, what was denied was the [phone] the 3D add-on and that was not done so the denial is basically for nothing. We always submit for the 3D image at the time of the echo auth just in case the MD would like to add it.   The echo did not require an [place]. We will continue to watch this one just to be sure.   Pt advised and will let us know if he has any further questions.

## 2022-04-19 ENCOUNTER — Ambulatory Visit (HOSPITAL_COMMUNITY): Payer: Medicare Other | Attending: Cardiology

## 2022-04-19 ENCOUNTER — Ambulatory Visit: Payer: Medicare Other

## 2022-04-19 DIAGNOSIS — I350 Nonrheumatic aortic (valve) stenosis: Secondary | ICD-10-CM | POA: Insufficient documentation

## 2022-04-19 DIAGNOSIS — Z79899 Other long term (current) drug therapy: Secondary | ICD-10-CM | POA: Insufficient documentation

## 2022-04-19 DIAGNOSIS — E782 Mixed hyperlipidemia: Secondary | ICD-10-CM

## 2022-04-19 LAB — ECHOCARDIOGRAM COMPLETE
AR max vel: 1.5 cm2
AV Area VTI: 1.51 cm2
AV Area mean vel: 1.52 cm2
AV Mean grad: 20 mmHg
AV Peak grad: 36 mmHg
Ao pk vel: 3 m/s
Area-P 1/2: 2.54 cm2
S' Lateral: 4 cm

## 2022-04-20 ENCOUNTER — Telehealth: Payer: Self-pay

## 2022-04-20 LAB — NMR, LIPOPROFILE
Cholesterol, Total: 146 mg/dL (ref 100–199)
HDL Particle Number: 33.3 umol/L (ref >=30.5)
HDL-C: 66 mg/dL (ref >39)
LDL Particle Number: 570 nmol/L (ref <1000)
LDL Size: 20.5 nm — ABNORMAL LOW (ref >20.5)
LDL-C (NIH Calc): 65 mg/dL (ref 0–99)
LP-IR Score: 34 (ref <=45)
Small LDL Particle Number: 274 nmol/L (ref <=527)
Triglycerides: 75 mg/dL (ref 0–149)

## 2022-04-20 NOTE — Telephone Encounter (Signed)
Left a message for the pt to call back.... waiting on his NMR.   He needs to make an appt with Dr Tenny Craw in Dec or early Jan. (Take any spot)

## 2022-04-23 NOTE — Telephone Encounter (Signed)
I spoke with the pt re: his Echo and his need to follow up with Dr Tenny Craw... he has an appt 05/23/22 but not able to reschedule to sooner.. will forward to Dr Tenny Craw to see where we can work him in if she would like to see him sooner or if we can call him and go over a plan based on his recent Echo results.

## 2022-04-23 NOTE — Telephone Encounter (Signed)
Left another message for the pt to call back.  

## 2022-04-23 NOTE — Telephone Encounter (Signed)
Pt is returning call. Transferred to Ann, RN.  

## 2022-04-24 NOTE — Telephone Encounter (Signed)
January appt is fine   Pt advised and given his NMR results.

## 2022-05-23 ENCOUNTER — Ambulatory Visit: Payer: Medicare Other | Admitting: Internal Medicine

## 2022-06-12 ENCOUNTER — Ambulatory Visit: Payer: Medicare Other | Admitting: Internal Medicine

## 2022-06-19 NOTE — Progress Notes (Unsigned)
Cardiology Office Note   Date:  06/22/2022   ID:  [name] [name], DOB Oct 17, 1933, MRN [id number]  PCP:  Baxter Hire, MD  Cardiologist:   Dorris Carnes, MD   F/u of CAD     History of Present Illness: [name] [name] is a 89 y.o. male with a history of CAD, s/p NSTEMI . [place] 2/07 demonstrated an occluded OM, mild nonobstructive disease in the LAD with 20 and 40% lesions and preserved LV function. He had return of perfusion in his OM with wire passage alone. No balloon angioplasty or stenting was required.   Nuclear study 4/08: Prior lateral MI with mild peri-infarct ischemia. Echocardiogram 2/12: Mild LVH, EF 25-05%, grade 1 diastolic dysfunction, mild TR. Holter monitor 2/13: Sinus rhythm, frequent PVCs, couplets and triplets, occasional PACs.     I saw the patient in clinic in May 2021  The week prior he had been admitted for chest pain  Myovue was normal   After this visit the patient was admitted at the end of May for abdominal pain.  He went on to have excision of 2 feet of necrotic bowel.  Postoperatively he developed atrial fibrillation.  He was placed on Eliquis.  Monitor in Jan 2022 showed no atrial fibrillation   Frequent PVCs     Recomm that he stop Eliquis    I saw the pt in Aug 2023  Since seen has a treadmill   Walking slowly for 1 hour   Deneis CP   No dizziness No PND  no edema   No palpitations      Outpatient Medications Prior to Visit  Medication Sig Dispense Refill   BEE POLLEN PO Take 1 capsule by mouth daily.     metoprolol succinate (TOPROL-XL) 25 MG 24 hr tablet TAKE 1 TABLET(25 MG) BY MOUTH DAILY 90 tablet 3   naproxen (NAPROSYN) 500 MG tablet Take 500 mg by mouth 2 (two) times daily with a meal.     nitroGLYCERIN (NITROSTAT) 0.4 MG SL tablet place 1 tablet under the tongue if needed every 5 minutes for chest pain for 3 doses IF NO RELIEF AFTER 3RD DOSE CALL PRESCRIBER OR 911. 25 tablet 2   OVER THE COUNTER MEDICATION OMEGA XL 2 CAPSULES DAILY FOR  ARTHRITIS IN HANDS AND SHOULDERS     rosuvastatin (CRESTOR) 40 MG tablet Take 1 tablet (40 mg total) by mouth daily. 90 tablet 3   No facility-administered medications prior to visit.     Allergies:   Other; Tetanus toxoid, adsorbed; and Tetanus toxoids   Past Medical History:  Diagnosis Date   Bradycardia    relative, precluding up titration of his beta-blocker   CAD (coronary artery disease)    HLD (hyperlipidemia)    Non-ST elevated myocardial infarction (non-STEMI) ([place])    2/07: LHC with occluded OM, LAD 20% + 40%; normal LVF => PTCA of OM with wire passage alone    PVC's (premature ventricular contractions)    Syncope    hx    Past Surgical History:  Procedure Laterality Date   APPLICATION OF WOUND VAC N/A 10/17/2019   Procedure: APPLICATION OF WOUND VAC;  Surgeon: Olean Ree, MD;  Location: [facility];  Service: General;  Laterality: N/A;  [id number]    APPLICATION OF WOUND VAC  10/18/2019   Procedure: APPLICATION OF WOUND VAC;  Surgeon: Olean Ree, MD;  Location: [facility];  Service: General;;   BOWEL RESECTION N/A 10/17/2019   Procedure: SMALL BOWEL RESECTION;  Surgeon: Olean Ree, MD;  Location: [facility];  Service: General;  Laterality: N/A;   BOWEL RESECTION  10/18/2019   Procedure: SMALL BOWEL RESECTION;  Surgeon: Olean Ree, MD;  Location: [facility];  Service: General;;   CARDIAC CATHETERIZATION     coronary arteriogram     EXPLORATORY LAPAROTOMY  1970s   for bleeding peptic ulcer   LAPAROTOMY N/A 10/17/2019   Procedure: EXPLORATORY LAPAROTOMY;  Surgeon: Olean Ree, MD;  Location: [facility];  Service: General;  Laterality: N/A;   LAPAROTOMY N/A 10/18/2019   Procedure: EXPLORATORY LAPAROTOMY;  Surgeon: Olean Ree, MD;  Location: [facility];  Service: General;  Laterality: N/A;   left ventriculogram     reperfusion of obtuse marginal with a wife alone       Social History:  The patient  reports that he has never smoked. He has quit using smokeless tobacco.   His smokeless tobacco use included chew. He reports that he does not drink alcohol and does not use drugs.   Family History:  The patient's family history includes Heart attack in his father and mother.    ROS:  Please see the history of present illness. All other systems are reviewed and  Negative to the above problem except as noted.    PHYSICAL EXAM: VS:  BP 124/70   Pulse 68   Ht 5\' 7"  (1.702 m)   Wt 162 lb (73.5 kg)   SpO2 97%   BMI 25.37 kg/m   GEN: thin 89 yo in no a cute distress  HEENT: NCAT   Neck: JVP normal.  Radiating murmur to R carotid  Cardiac: RRR;  Gr III/VI systolic murmur LSB / base.  Later peaking No  LE edema  Respiratory:  clear to auscultation bilaterally,  GI: soft, nontender, nondistended, + BS  No hepatomegaly  Extremities: No lower extremity edema  EKG:  EKG is  not ordered today   Lipid Panel    Component Value Date/Time   CHOL 148 01/18/2020 1641   TRIG 138 01/18/2020 1641   HDL 62 01/18/2020 1641   CHOLHDL 2.4 01/18/2020 1641   CHOLHDL 2.8 10/27/2018 0819   VLDL 17 10/27/2018 0819   LDLCALC 62 01/18/2020 1641      Wt Readings from Last 3 Encounters:  06/22/22 162 lb (73.5 kg)  12/20/21 157 lb 6.4 oz (71.4 kg)  10/31/20 163 lb (73.9 kg)      ASSESSMENT AND PLAN:  1.  CAD.  Remote cath in 2007 This showed LCX occlused  Otherwise nonobstructive CAD  His OM was reperfused with wire   No PTCA or stent    Myovue in May 2021 again showed no ischemia   Inferolateral and apical lateral infarct with mild periinfarct ischemia    Patient remains asymptomatic      3  HL   continue on Crestor   Will check lipomed   Last LDL over 70 in Feb    4  AV dz   Appears mod/severe to severe      5 PAF. Occurred after surgery   Monitor in Jan 2022 showed no afib  Elqius stopped     6  PVCs   Monitor in Aug 2022 showed SR with 13% PVCs  Pt asymptomatic   Keep on Toprol XL     F/u in Jan / Feb 2023   Current medicines are reviewed at length with the  patient today.  The patient does not have concerns regarding medicines.  Signed, Nevin Bloodgood  Harrington Challenger, MD  06/22/2022 9:07 AM    [facility] [place], [place], [place]  [address] Phone: [phone]; Fax: [phone]

## 2022-06-22 ENCOUNTER — Encounter: Payer: Self-pay | Admitting: Internal Medicine

## 2022-06-22 ENCOUNTER — Ambulatory Visit: Payer: Medicare Other | Attending: Internal Medicine | Admitting: Internal Medicine

## 2022-06-22 VITALS — BP 124/70 | HR 68 | Ht 67.0 in | Wt 162.0 lb

## 2022-06-22 DIAGNOSIS — I350 Nonrheumatic aortic (valve) stenosis: Secondary | ICD-10-CM | POA: Diagnosis not present

## 2022-06-22 NOTE — Patient Instructions (Signed)
Medication Instructions:  Your physician recommends that you continue on your current medications as directed. Please refer to the Current Medication list given to you today.  *If you need a refill on your cardiac medications before your next appointment, please call your pharmacy*  Lab Work: If you have labs (blood work) drawn today and your tests are completely normal, you will receive your results only by: [place] (if you have MyChart) OR A paper copy in the mail If you have any lab test that is abnormal or we need to change your treatment, we will call you to review the results.  Testing/Procedures: Your physician has requested that you have an echocardiogram end of March. Echocardiography is a painless test that uses sound waves to create images of your heart. It provides your doctor with information about the size and shape of your heart and how well your heart's chambers and valves are working. This procedure takes approximately one hour. There are no restrictions for this procedure. Please do NOT wear cologne, perfume, aftershave, or lotions (deodorant is allowed). Please arrive 15 minutes prior to your appointment time.  Follow-Up: At [facility], you and your health needs are our priority.  As part of our continuing mission to provide you with exceptional heart care, we have created designated Provider Care Teams.  These Care Teams include your primary Cardiologist (physician) and Advanced Practice Providers (APPs -  Physician Assistants and Nurse Practitioners) who all work together to provide you with the care you need, when you need it.  We recommend signing up for the patient portal called "MyChart".  Sign up information is provided on this After Visit Summary.  MyChart is used to connect with patients for Virtual Visits (Telemedicine).  Patients are able to view lab/test results, encounter notes, upcoming appointments, etc.  Non-urgent messages can be sent to your  provider as well.   To learn more about what you can do with MyChart, go to [email or web address].    Your next appointment:   6 month(s)  Provider:   Dorris Carnes, MD

## 2022-06-26 ENCOUNTER — Other Ambulatory Visit: Payer: Self-pay | Admitting: Student

## 2022-06-26 DIAGNOSIS — M5416 Radiculopathy, lumbar region: Secondary | ICD-10-CM

## 2022-06-26 DIAGNOSIS — M461 Sacroiliitis, not elsewhere classified: Secondary | ICD-10-CM

## 2022-07-10 ENCOUNTER — Ambulatory Visit
Admission: RE | Admit: 2022-07-10 | Discharge: 2022-07-10 | Disposition: A | Discharge status: Home / Self Care | Payer: Medicare Other | Source: Ambulatory Visit | Attending: Student | Admitting: Student

## 2022-07-10 DIAGNOSIS — M461 Sacroiliitis, not elsewhere classified: Secondary | ICD-10-CM

## 2022-07-10 DIAGNOSIS — M5416 Radiculopathy, lumbar region: Secondary | ICD-10-CM

## 2022-08-16 ENCOUNTER — Ambulatory Visit (HOSPITAL_COMMUNITY): Payer: Medicare Other | Attending: Internal Medicine

## 2022-08-16 DIAGNOSIS — I350 Nonrheumatic aortic (valve) stenosis: Secondary | ICD-10-CM | POA: Diagnosis not present

## 2022-08-16 LAB — ECHOCARDIOGRAM COMPLETE
AR max vel: 0.85 cm2
AV Area VTI: 1.12 cm2
AV Area mean vel: 0.8 cm2
AV Mean grad: 24 mmHg
AV Peak grad: 37.9 mmHg
Ao pk vel: 3.08 m/s
Area-P 1/2: 2.8 cm2
P 1/2 time: 346 msec
S' Lateral: 3.5 cm

## 2022-08-30 NOTE — Progress Notes (Signed)
Structural Heart Clinic Consult Note  Chief Complaint  Patient presents with   New Patient (Initial Visit)    Severe aortic stenosis   History of Present Illness: 89 yo [name] [name] with history of CAD, paroxysmal atrial fibrillation, HLD, PVCs and aortic stenosis who is here today as a new consult, referred by Dr. Tenny Craw, for further discussion regarding his aortic stenosis and possible TAVR. He had a NSTEMI in 2007 and had occlusion of the obtuse marginal branch. Flow was restored with passage of the wire and no angioplasty was performed. Mild disease in the LAD at that time. Nuclear stress test in 2021 with no ischemia. He developed post-operative atrial fibrillation following a bowel resection in 2021 and was on Eliquis for a short time but this was stopped in 2022 after a monitor did not demonstrate recurrence of his atrial fib. He has been followed for moderate aortic stenosis. Echo 08/16/22 with LVEF=50-[name]%. Moderately severe to severe aortic stenosis by calculations. The aortic valve leaflets are thickened and calcified and visually the valve appears to be severely stenotic. Mean gradient 24 mmHg, AVA 0.8 cm2, SVI 45, DI 0.29. 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   He tells me today that he has progressive dyspnea on exertion and fatigue. He has dizziness but no near syncope or syncope. No chest pain. No LE edema. He walks on the treadmill 3 days per week. He had been doing one hour but now has to stop at 15 minutes due to dyspnea and dizziness. He lives in [place] with his wife and son. He is a retired Freight forwarder. He has full dentures.   Primary Care Physician: Gracelyn Nurse, MD Primary Cardiologist: Tenny Craw Referring Cardiologist: Tenny Craw  Past Medical History:  Diagnosis Date   Bradycardia    relative, precluding up titration of his beta-blocker   CAD (coronary artery disease)    HLD (hyperlipidemia)    Non-ST elevated myocardial infarction (non-STEMI)     2/07: LHC with occluded OM, LAD 20% + 40%; normal LVF => PTCA of OM with wire passage alone    PVC's (premature ventricular contractions)    Syncope    hx    Past Surgical History:  Procedure Laterality Date   APPLICATION OF WOUND VAC N/A 10/17/2019   Procedure: APPLICATION OF WOUND VAC;  Surgeon: Henrene Dodge, MD;  Location: [facility];  Service: General;  Laterality: N/A;  [id number]    APPLICATION OF WOUND VAC  10/18/2019   Procedure: APPLICATION OF WOUND VAC;  Surgeon: Henrene Dodge, MD;  Location: [facility];  Service: General;;   BOWEL RESECTION N/A 10/17/2019   Procedure: SMALL BOWEL RESECTION;  Surgeon: Henrene Dodge, MD;  Location: [facility];  Service: General;  Laterality: N/A;   BOWEL RESECTION  10/18/2019   Procedure: SMALL BOWEL RESECTION;  Surgeon: Henrene Dodge, MD;  Location: [facility];  Service: General;;   CARDIAC CATHETERIZATION     coronary arteriogram     EXPLORATORY LAPAROTOMY  1970s   for bleeding peptic ulcer   LAPAROTOMY N/A 10/17/2019   Procedure: EXPLORATORY LAPAROTOMY;  Surgeon: Henrene Dodge, MD;  Location: [facility];  Service: General;  Laterality: N/A;   LAPAROTOMY N/A 10/18/2019   Procedure: EXPLORATORY LAPAROTOMY;  Surgeon: Henrene Dodge, MD;  Location: [facility];  Service: General;  Laterality: N/A;   left ventriculogram      Current Outpatient Medications  Medication Sig Dispense Refill   BEE POLLEN PO Take 1 capsule by mouth daily.  metoprolol succinate (TOPROL-XL) 25 MG 24 hr tablet TAKE 1 TABLET(25 MG) BY MOUTH DAILY 90 tablet 3   nitroGLYCERIN (NITROSTAT) 0.4 MG SL tablet place 1 tablet under the tongue if needed every 5 minutes for chest pain for 3 doses IF NO RELIEF AFTER 3RD DOSE CALL PRESCRIBER OR 911. 25 tablet 2   OVER THE COUNTER MEDICATION OMEGA XL 2 CAPSULES DAILY FOR ARTHRITIS IN HANDS AND SHOULDERS     rosuvastatin (CRESTOR) 40 MG tablet Take 1 tablet (40 mg total) by mouth daily. 90 tablet 3   No current facility-administered  medications for this visit.    Allergies  Allergen Reactions   Other Other (See Comments)    Tetanus shot   Tetanus Toxoid, Adsorbed     Tetanus shot   Tetanus Toxoids     Tetanus shot    Social History   Socioeconomic History   Marital status: Divorced    Spouse name: Not on file   Number of children: 3   Years of education: Not on file   Highest education level: Not on file  Occupational History   Occupation: Retired Naval architect  Tobacco Use   Smoking status: Never   Smokeless tobacco: Former    Types: Associate Professor Use: Never used  Substance and Sexual Activity   Alcohol use: No    Alcohol/week: 0.0 standard drinks of alcohol   Drug use: No   Sexual activity: Not on file  Other Topics Concern   Not on file  Social History Narrative   He is married, divorced, and now leaving with his ex-wife. Continues to drive tractor-trailers.    Social Determinants of Health   Financial Resource Strain: Not on file  Food Insecurity: Not on file  Transportation Needs: Not on file  Physical Activity: Not on file  Stress: Not on file  Social Connections: Not on file  Intimate Partner Violence: Not on file    Family History  Problem Relation Age of Onset   Heart attack Mother 52   Heart attack Father 32    Review of Systems:  As stated in the HPI and otherwise negative.   BP 104/62   Pulse 67   Ht 5\' 7"  (1.702 m)   Wt 72.2 kg   SpO2 97%   BMI 24.93 kg/m   Physical Examination: General: Well developed, well nourished, NAD  HEENT: OP clear, mucus membranes moist  SKIN: warm, dry. No rashes. Neuro: No focal deficits  Musculoskeletal: Muscle strength 5/5 all ext  Psychiatric: Mood and affect normal  Neck: No JVD, no carotid bruits, no thyromegaly, no lymphadenopathy.  Lungs:Clear bilaterally, no wheezes, rhonci, crackles Cardiovascular: Regular rate and rhythm. Loud, harsh, late peaking systolic murmur.  Abdomen:Soft. Bowel sounds present.  Non-tender.  Extremities: No lower extremity edema. Pulses are 2 + in the bilateral DP/PT.  EKG:  EKG is ordered today. The ekg ordered today demonstrates sinus rhythm. PVC  Echo 08/16/22: 1. Left ventricular ejection fraction, by estimation, is 50 to [name]%. The  left ventricle has low normal function. The left ventricle has no regional  wall motion abnormalities. There is mild concentric left ventricular  hypertrophy. Left ventricular  diastolic parameters are consistent with Grade I diastolic dysfunction  (impaired relaxation).   2. Right ventricular systolic function is normal. The right ventricular  size is normal. There is normal pulmonary artery systolic pressure.   3. Trivial mitral valve regurgitation.   4. LVOT may be overestimated, SVI closer  to ~39 borderline, AVA may also  be overestimated. DI 0.29. AV mean gradient 24 mmHg. . The aortic valve is  calcified. Aortic valve regurgitation is trivial. Moderate to severe  aortic valve stenosis.   5. The inferior vena cava is normal in size with greater than 50%  respiratory variability, suggesting right atrial pressure of 3 mmHg.   Comparison(s): No significant change from prior study.   Conclusion(s)/Recommendation(s): Mod-severe AS, can consider cardiac CT.   FINDINGS   Left Ventricle: Left ventricular ejection fraction, by estimation, is 50  to [name]%. The left ventricle has low normal function. The left ventricle has  no regional wall motion abnormalities. Global longitudinal strain  performed but not reported based on  interpreter judgement due to suboptimal tracking. The left ventricular  internal cavity size was normal in size. There is mild concentric left  ventricular hypertrophy. Left ventricular diastolic parameters are  consistent with Grade I diastolic dysfunction   (impaired relaxation).   Right Ventricle: The right ventricular size is normal. Right ventricular  systolic function is normal. There is normal  pulmonary artery systolic  pressure. The tricuspid regurgitant velocity is 2.23 m/s, and with an  assumed right atrial pressure of 3 mmHg,   the estimated right ventricular systolic pressure is 22.9 mmHg.   Left Atrium: Left atrial size was normal in size.   Right Atrium: Right atrial size was normal in size.   Pericardium: There is no evidence of pericardial effusion.   Mitral Valve: Mild to moderate mitral annular calcification. Trivial  mitral valve regurgitation.   Tricuspid Valve: Tricuspid valve regurgitation is mild.   Aortic Valve: LVOT may be overestimated, SVI closer to ~39 borderline, AVA  may also be overestimated. DI 0.29. AV mean gradient 24 mmHg. The aortic  valve is calcified. Aortic valve regurgitation is trivial. Aortic  regurgitation PHT measures 346 msec.  Moderate to severe aortic stenosis is present. Aortic valve mean gradient  measures 24.0 mmHg. Aortic valve peak gradient measures 37.9 mmHg. Aortic  valve area, by VTI measures 1.12 cm.   Pulmonic Valve: Pulmonic valve regurgitation is mild.   Aorta: The aortic root and ascending aorta are structurally normal, with  no evidence of dilitation.   Venous: The inferior vena cava is normal in size with greater than 50%  respiratory variability, suggesting right atrial pressure of 3 mmHg.   IAS/Shunts: No atrial level shunt detected by color flow Doppler.     LEFT VENTRICLE  PLAX 2D  LVIDd:         4.70 cm   Diastology  LVIDs:         3.50 cm   LV e' medial:    7.83 cm/s  LV PW:         1.10 cm   LV E/e' medial:  10.1  LV IVS:        1.30 cm   LV e' lateral:   8.27 cm/s  LVOT diam:     2.20 cm   LV E/e' lateral: 9.6  LV SV:         88  LV SV Index:   47        2D Longitudinal Strain  LVOT Area:     3.80 cm  2D Strain GLS (A2C):   -19.7 %                           2D Strain GLS (A3C):   -13.0 %  2D Strain GLS (A4C):   -17.6 %                           2D Strain GLS Avg:      -16.8 %   RIGHT VENTRICLE  RV Basal diam:  3.80 cm  RV S prime:     9.03 cm/s  TAPSE (M-mode): 1.8 cm  RVSP:           22.9 mmHg   LEFT ATRIUM             Index        RIGHT ATRIUM           Index  LA diam:        3.70 cm 2.00 cm/m   RA Pressure: 3.00 mmHg  LA Vol (A2C):   45.7 ml 24.72 ml/m  RA Area:     13.40 cm  LA Vol (A4C):   48.9 ml 26.45 ml/m  RA Volume:   32.40 ml  17.52 ml/m  LA Biplane Vol: 49.0 ml 26.50 ml/m   AORTIC VALVE  AV Area (Vmax):    0.85 cm  AV Area (Vmean):   0.80 cm  AV Area (VTI):     1.12 cm  AV Vmax:           308.00 cm/s  AV Vmean:          228.000 cm/s  AV VTI:            0.787 m  AV Peak Grad:      37.9 mmHg  AV Mean Grad:      24.0 mmHg  LVOT Vmax:         68.70 cm/s  LVOT Vmean:        48.200 cm/s  LVOT VTI:          0.231 m  LVOT/AV VTI ratio: 0.29  AI PHT:            346 msec    AORTA  Ao Root diam: 3.60 cm  Ao Asc diam:  3.70 cm   MITRAL VALVE                TRICUSPID VALVE  MV Area (PHT):              TR Peak grad:   19.9 mmHg  MV Decel Time:              TR Vmax:        223.00 cm/s  MV E velocity: 79.10 cm/s   Estimated RAP:  3.00 mmHg  MV A velocity: 105.00 cm/s  RVSP:           22.9 mmHg  MV E/A ratio:  0.75                              SHUNTS                              Systemic VTI:  0.23 m                              Systemic Diam: 2.20 cm   Recent Labs: No results found for requested labs within last 365 days.    Wt Readings from Last 3 Encounters:  08/31/22 72.2 kg  06/22/22 73.5 kg  12/20/21 71.4 kg    Assessment and Plan:   1. Severe Aortic Valve Stenosis: He appears to have severe paradoxical low flow/low gradient aortic valve stenosis. I have personally reviewed the echo images.The aortic valve is thickened, calcified with limited leaflet mobility. I have also reviewed his echo images with our IC structural team in the office today and there is agreement that his aortic stenosis is severe. He has NYHA class  3 symptoms. I think he would benefit from AVR. Given advanced age, he is not a good candidate for conventional AVR by surgical approach. I think he may be a good candidate for TAVR.   I have reviewed the natural history of aortic stenosis with the patient and their family members  who are present today. We have discussed the limitations of medical therapy and the poor prognosis associated with symptomatic aortic stenosis. We have reviewed potential treatment options, including palliative medical therapy, conventional surgical aortic valve replacement, and transcatheter aortic valve replacement. We discussed treatment options in the context of the patient's specific comorbid medical conditions.   He would like to proceed with planning for TAVR. I will arrange [facility] at 9am. Risks and benefits of the cath procedure and the valve procedure are reviewed with the patient. After the cath, he will have a cardiac CT, CTA of the chest/abdomen and pelvis and will then be referred to see one of the CT surgeons on our TAVR team.     Labs/ tests ordered today include:   Orders Placed This Encounter  Procedures   CBC   Basic metabolic panel   EKG 12-Lead   Disposition:   F/U will be planned with the structural team  Signed, Verne Carrowhristopher Shamarcus Hoheisel, MD, [facility] Center[facility] 08/31/2022 9:54 AM    [facility] [address], [place], [place]  [address] Phone: [phone]; Fax: [phone]

## 2022-08-30 NOTE — H&P (View-Only) (Signed)
 [facility] Consult Note  Chief Complaint  Patient presents with   New Patient (Initial Visit)    Severe aortic stenosis   History of Present Illness: 89 yo male with history of CAD, paroxysmal atrial fibrillation, HLD, PVCs and aortic stenosis who is here today as a new consult, referred by Dr. Ross, for further discussion regarding his aortic stenosis and possible TAVR. He had a NSTEMI in 2007 and had occlusion of the obtuse marginal branch. Flow was restored with passage of the wire and no angioplasty was performed. Mild disease in the LAD at that time. Nuclear stress test in 2021 with no ischemia. He developed post-operative atrial fibrillation following a bowel resection in 2021 and was on Eliquis for a short time but this was stopped in 2022 after a monitor did not demonstrate recurrence of his atrial fib. He has been followed for moderate aortic stenosis. Echo 08/16/22 with LVEF=50-55%. Moderately severe to severe aortic stenosis by calculations. The aortic valve leaflets are thickened and calcified and visually the valve appears to be severely stenotic. Mean gradient 24 mmHg, AVA 0.8 cm2, SVI 45, DI 0.29. 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   He tells me today that he has progressive dyspnea on exertion and fatigue. He has dizziness but no near syncope or syncope. No chest pain. No LE edema. He walks on the treadmill 3 days per week. He had been doing one hour but now has to stop at 15 minutes due to dyspnea and dizziness. He lives in [place] with his wife and son. He is a retired long distance truck driver. He has full dentures.   Primary Care Physician: Johnston, John D, MD Primary Cardiologist: Ross Referring Cardiologist: Ross  Past Medical History:  Diagnosis Date   Bradycardia    relative, precluding up titration of his beta-blocker   CAD (coronary artery disease)    HLD (hyperlipidemia)    Non-ST elevated myocardial infarction (non-STEMI)     2/07: LHC with occluded OM, LAD 20% + 40%; normal LVF => PTCA of OM with wire passage alone    PVC's (premature ventricular contractions)    Syncope    hx    Past Surgical History:  Procedure Laterality Date   APPLICATION OF WOUND VAC N/A 10/17/2019   Procedure: APPLICATION OF WOUND VAC;  Surgeon: Piscoya, Jose, MD;  Location: [facility];  Service: General;  Laterality: N/A;  [id number]    APPLICATION OF WOUND VAC  10/18/2019   Procedure: APPLICATION OF WOUND VAC;  Surgeon: Piscoya, Jose, MD;  Location: [facility];  Service: General;;   BOWEL RESECTION N/A 10/17/2019   Procedure: SMALL BOWEL RESECTION;  Surgeon: Piscoya, Jose, MD;  Location: [facility];  Service: General;  Laterality: N/A;   BOWEL RESECTION  10/18/2019   Procedure: SMALL BOWEL RESECTION;  Surgeon: Piscoya, Jose, MD;  Location: [facility];  Service: General;;   CARDIAC CATHETERIZATION     coronary arteriogram     EXPLORATORY LAPAROTOMY  1970s   for bleeding peptic ulcer   LAPAROTOMY N/A 10/17/2019   Procedure: EXPLORATORY LAPAROTOMY;  Surgeon: Piscoya, Jose, MD;  Location: [facility];  Service: General;  Laterality: N/A;   LAPAROTOMY N/A 10/18/2019   Procedure: EXPLORATORY LAPAROTOMY;  Surgeon: Piscoya, Jose, MD;  Location: [facility];  Service: General;  Laterality: N/A;   left ventriculogram      Current Outpatient Medications  Medication Sig Dispense Refill   BEE POLLEN PO Take 1 capsule by mouth daily.       metoprolol succinate (TOPROL-XL) 25 MG 24 hr tablet TAKE 1 TABLET(25 MG) BY MOUTH DAILY 90 tablet 3   nitroGLYCERIN (NITROSTAT) 0.4 MG SL tablet place 1 tablet under the tongue if needed every 5 minutes for chest pain for 3 doses IF NO RELIEF AFTER 3RD DOSE CALL PRESCRIBER OR 911. 25 tablet 2   OVER THE COUNTER MEDICATION OMEGA XL 2 CAPSULES DAILY FOR ARTHRITIS IN HANDS AND SHOULDERS     rosuvastatin (CRESTOR) 40 MG tablet Take 1 tablet (40 mg total) by mouth daily. 90 tablet 3   No current facility-administered  medications for this visit.    Allergies  Allergen Reactions   Other Other (See Comments)    Tetanus shot   Tetanus Toxoid, Adsorbed     Tetanus shot   Tetanus Toxoids     Tetanus shot    Social History   Socioeconomic History   Marital status: Divorced    Spouse name: Not on file   Number of children: 3   Years of education: Not on file   Highest education level: Not on file  Occupational History   Occupation: Retired truck driver  Tobacco Use   Smoking status: Never   Smokeless tobacco: Former    Types: Chew  Vaping Use   Vaping Use: Never used  Substance and Sexual Activity   Alcohol use: No    Alcohol/week: 0.0 standard drinks of alcohol   Drug use: No   Sexual activity: Not on file  Other Topics Concern   Not on file  Social History Narrative   He is married, divorced, and now leaving with his ex-wife. Continues to drive tractor-trailers.    Social Determinants of Health   Financial Resource Strain: Not on file  Food Insecurity: Not on file  Transportation Needs: Not on file  Physical Activity: Not on file  Stress: Not on file  Social Connections: Not on file  Intimate Partner Violence: Not on file    Family History  Problem Relation Age of Onset   Heart attack Mother 81   Heart attack Father 51    Review of Systems:  As stated in the HPI and otherwise negative.   BP 104/62   Pulse 67   Ht 5' 7" (1.702 m)   Wt 72.2 kg   SpO2 97%   BMI 24.93 kg/m   Physical Examination: General: Well developed, well nourished, NAD  HEENT: OP clear, mucus membranes moist  SKIN: warm, dry. No rashes. Neuro: No focal deficits  Musculoskeletal: Muscle strength 5/5 all ext  Psychiatric: Mood and affect normal  Neck: No JVD, no carotid bruits, no thyromegaly, no lymphadenopathy.  Lungs:Clear bilaterally, no wheezes, rhonci, crackles Cardiovascular: Regular rate and rhythm. Loud, harsh, late peaking systolic murmur.  Abdomen:Soft. Bowel sounds present.  Non-tender.  Extremities: No lower extremity edema. Pulses are 2 + in the bilateral DP/PT.  EKG:  EKG is ordered today. The ekg ordered today demonstrates sinus rhythm. PVC  Echo 08/16/22: 1. Left ventricular ejection fraction, by estimation, is 50 to 55%. The  left ventricle has low normal function. The left ventricle has no regional  wall motion abnormalities. There is mild concentric left ventricular  hypertrophy. Left ventricular  diastolic parameters are consistent with Grade I diastolic dysfunction  (impaired relaxation).   2. Right ventricular systolic function is normal. The right ventricular  size is normal. There is normal pulmonary artery systolic pressure.   3. Trivial mitral valve regurgitation.   4. LVOT may be overestimated, SVI closer   to ~39 borderline, AVA may also  be overestimated. DI 0.29. AV mean gradient 24 mmHg. . The aortic valve is  calcified. Aortic valve regurgitation is trivial. Moderate to severe  aortic valve stenosis.   5. The inferior vena cava is normal in size with greater than 50%  respiratory variability, suggesting right atrial pressure of 3 mmHg.   Comparison(s): No significant change from prior study.   Conclusion(s)/Recommendation(s): Mod-severe AS, can consider cardiac CT.   FINDINGS   Left Ventricle: Left ventricular ejection fraction, by estimation, is 50  to 55%. The left ventricle has low normal function. The left ventricle has  no regional wall motion abnormalities. Global longitudinal strain  performed but not reported based on  interpreter judgement due to suboptimal tracking. The left ventricular  internal cavity size was normal in size. There is mild concentric left  ventricular hypertrophy. Left ventricular diastolic parameters are  consistent with Grade I diastolic dysfunction   (impaired relaxation).   Right Ventricle: The right ventricular size is normal. Right ventricular  systolic function is normal. There is normal  pulmonary artery systolic  pressure. The tricuspid regurgitant velocity is 2.23 m/s, and with an  assumed right atrial pressure of 3 mmHg,   the estimated right ventricular systolic pressure is 22.9 mmHg.   Left Atrium: Left atrial size was normal in size.   Right Atrium: Right atrial size was normal in size.   Pericardium: There is no evidence of pericardial effusion.   Mitral Valve: Mild to moderate mitral annular calcification. Trivial  mitral valve regurgitation.   Tricuspid Valve: Tricuspid valve regurgitation is mild.   Aortic Valve: LVOT may be overestimated, SVI closer to ~39 borderline, AVA  may also be overestimated. DI 0.29. AV mean gradient 24 mmHg. The aortic  valve is calcified. Aortic valve regurgitation is trivial. Aortic  regurgitation PHT measures 346 msec.  Moderate to severe aortic stenosis is present. Aortic valve mean gradient  measures 24.0 mmHg. Aortic valve peak gradient measures 37.9 mmHg. Aortic  valve area, by VTI measures 1.12 cm.   Pulmonic Valve: Pulmonic valve regurgitation is mild.   Aorta: The aortic root and ascending aorta are structurally normal, with  no evidence of dilitation.   Venous: The inferior vena cava is normal in size with greater than 50%  respiratory variability, suggesting right atrial pressure of 3 mmHg.   IAS/Shunts: No atrial level shunt detected by color flow Doppler.     LEFT VENTRICLE  PLAX 2D  LVIDd:         4.70 cm   Diastology  LVIDs:         3.50 cm   LV e' medial:    7.83 cm/s  LV PW:         1.10 cm   LV E/e' medial:  10.1  LV IVS:        1.30 cm   LV e' lateral:   8.27 cm/s  LVOT diam:     2.20 cm   LV E/e' lateral: 9.6  LV SV:         88  LV SV Index:   47        2D Longitudinal Strain  LVOT Area:     3.80 cm  2D Strain GLS (A2C):   -19.7 %                           2D Strain GLS (A3C):   -13.0 %                             2D Strain GLS (A4C):   -17.6 %                           2D Strain GLS Avg:      -16.8 %   RIGHT VENTRICLE  RV Basal diam:  3.80 cm  RV S prime:     9.03 cm/s  TAPSE (M-mode): 1.8 cm  RVSP:           22.9 mmHg   LEFT ATRIUM             Index        RIGHT ATRIUM           Index  LA diam:        3.70 cm 2.00 cm/m   RA Pressure: 3.00 mmHg  LA Vol (A2C):   45.7 ml 24.72 ml/m  RA Area:     13.40 cm  LA Vol (A4C):   48.9 ml 26.45 ml/m  RA Volume:   32.40 ml  17.52 ml/m  LA Biplane Vol: 49.0 ml 26.50 ml/m   AORTIC VALVE  AV Area (Vmax):    0.85 cm  AV Area (Vmean):   0.80 cm  AV Area (VTI):     1.12 cm  AV Vmax:           308.00 cm/s  AV Vmean:          228.000 cm/s  AV VTI:            0.787 m  AV Peak Grad:      37.9 mmHg  AV Mean Grad:      24.0 mmHg  LVOT Vmax:         68.70 cm/s  LVOT Vmean:        48.200 cm/s  LVOT VTI:          0.231 m  LVOT/AV VTI ratio: 0.29  AI PHT:            346 msec    AORTA  Ao Root diam: 3.60 cm  Ao Asc diam:  3.70 cm   MITRAL VALVE                TRICUSPID VALVE  MV Area (PHT):              TR Peak grad:   19.9 mmHg  MV Decel Time:              TR Vmax:        223.00 cm/s  MV E velocity: 79.10 cm/s   Estimated RAP:  3.00 mmHg  MV A velocity: 105.00 cm/s  RVSP:           22.9 mmHg  MV E/A ratio:  0.75                              SHUNTS                              Systemic VTI:  0.23 m                              Systemic Diam: 2.20 cm   Recent Labs: No results found for requested labs within last 365 days.    Wt Readings from Last 3 Encounters:  08/31/22 72.2 kg    06/22/22 73.5 kg  12/20/21 71.4 kg    Assessment and Plan:   1. Severe Aortic Valve Stenosis: He appears to have severe paradoxical low flow/low gradient aortic valve stenosis. I have personally reviewed the echo images.The aortic valve is thickened, calcified with limited leaflet mobility. I have also reviewed his echo images with our IC structural team in the office today and there is agreement that his aortic stenosis is severe. He has NYHA class  3 symptoms. I think he would benefit from AVR. Given advanced age, he is not a good candidate for conventional AVR by surgical approach. I think he may be a good candidate for TAVR.   I have reviewed the natural history of aortic stenosis with the patient and their family members  who are present today. We have discussed the limitations of medical therapy and the poor prognosis associated with symptomatic aortic stenosis. We have reviewed potential treatment options, including palliative medical therapy, conventional surgical aortic valve replacement, and transcatheter aortic valve replacement. We discussed treatment options in the context of the patient's specific comorbid medical conditions.   He would like to proceed with planning for TAVR. I will arrange a right and left heart catheterization at [facility] 09/06/22 at 9am. Risks and benefits of the cath procedure and the valve procedure are reviewed with the patient. After the cath, he will have a cardiac CT, CTA of the chest/abdomen and pelvis and will then be referred to see one of the CT surgeons on our TAVR team.     Labs/ tests ordered today include:   Orders Placed This Encounter  Procedures   CBC   Basic metabolic panel   EKG 12-Lead   Disposition:   F/U will be planned with the structural team  Signed, Iris Tatsch, MD, FACC 08/31/2022 9:54 AM    [facility] [address], [place], [place]  [address] Phone: [phone]; Fax: [phone]     

## 2022-08-31 ENCOUNTER — Ambulatory Visit: Payer: Medicare Other | Attending: Cardiovascular Disease | Admitting: Cardiovascular Disease

## 2022-08-31 ENCOUNTER — Encounter: Payer: Self-pay | Admitting: Cardiovascular Disease

## 2022-08-31 VITALS — BP 104/62 | HR 67 | Ht 67.0 in | Wt 159.2 lb

## 2022-08-31 DIAGNOSIS — Z01812 Encounter for preprocedural laboratory examination: Secondary | ICD-10-CM

## 2022-08-31 DIAGNOSIS — I350 Nonrheumatic aortic (valve) stenosis: Secondary | ICD-10-CM | POA: Diagnosis not present

## 2022-08-31 NOTE — Patient Instructions (Signed)
Medication Instructions:  No changes *If you need a refill on your cardiac medications before your next appointment, please call your pharmacy*   Lab Work: Today: cbc, bmet   Testing/Procedures: Your physician has requested that you have a cardiac catheterization. Cardiac catheterization is used to diagnose and/or treat various heart conditions. Doctors may recommend this procedure for a number of different reasons. The most common reason is to evaluate chest pain. Chest pain can be a symptom of coronary artery disease (CAD), and cardiac catheterization can show whether plaque is narrowing or blocking your heart's arteries. This procedure is also used to evaluate the valves, as well as measure the blood flow and oxygen levels in different parts of your heart. For further information please visit [email or web address] Please follow instruction sheet, as given.    Follow-Up: Per Structural Heart Team        Cardiac/Peripheral Catheterization   You are scheduled for a Cardiac Catheterization on Thursday, April 18 with Dr. Verne Carrow.  1. Please arrive at the [facility] (Main Entrance A) at [facility]: [address] [place], [place] [address] at 7:00 AM (This time is two hours before your procedure to ensure your preparation). Free valet parking service is available. You will check in at ADMITTING. The support person will be asked to wait in the waiting room.  It is OK to have someone drop you off and come back when you are ready to be discharged.        Special note: Every effort is made to have your procedure done on time. Please understand that emergencies sometimes delay scheduled procedures.  2. Diet: Do not eat solid foods after midnight.  You may have clear liquids until 5 AM the day of the procedure.  3. Labs: You will need to have blood drawn today.  You do not need to be fasting.  4. Medication instructions in preparation for your procedure:   Contrast  Allergy: No   On the morning of your procedure, take Aspirin 81 mg and any morning medicines NOT listed above.  You may use sips of water.  5. Plan to go home the same day, you will only stay overnight if medically necessary. 6. You MUST have a responsible adult to drive you home. 7. An adult MUST be with you the first 24 hours after you arrive home. 8. Bring a current list of your medications, and the last time and date medication taken. 9. Bring ID and current insurance cards. 10.Please wear clothes that are easy to get on and off and wear slip-on shoes.  Thank you for allowing [place] to care for you!   -- [facility]

## 2022-08-31 NOTE — Progress Notes (Addendum)
Pre Surgical Assessment: 5 M Walk Test  28M=16.61ft  5 Meter Walk Test- trial 1: 5.98 seconds 5 Meter Walk Test- trial 2: 5.43 seconds 5 Meter Walk Test- trial 3: 4.87 seconds 5 Meter Walk Test Average: 5.42 seconds    STS surgical risk calculation:   Procedure Type: Isolated AVR PERIOPERATIVE OUTCOME ESTIMATE % Operative Mortality 3.02% Morbidity & Mortality 7.92% Stroke 1.48% Renal Failure 1% Reoperation 3.87% Prolonged Ventilation 3.35% Deep Sternal Wound Infection 0.041% Long Hospital Stay (>14 days) 4.07% Short Hospital Stay (<6 days)* 43.6%

## 2022-09-01 LAB — BASIC METABOLIC PANEL
BUN/Creatinine Ratio: 25 — ABNORMAL HIGH (ref 10–24)
BUN: 18 mg/dL (ref 8–27)
CO2: 25 mmol/L (ref 20–29)
Calcium: 9.6 mg/dL (ref 8.6–10.2)
Chloride: 99 mmol/L (ref 96–106)
Creatinine, Ser: 0.71 mg/dL — ABNORMAL LOW (ref 0.76–1.27)
Glucose: 105 mg/dL — ABNORMAL HIGH (ref 70–99)
Potassium: 4.5 mmol/L (ref 3.5–5.2)
Sodium: 139 mmol/L (ref 134–144)
eGFR: 89 mL/min/{1.73_m2} (ref >59)

## 2022-09-01 LAB — CBC
Hematocrit: 42.6 % (ref 37.5–51.0)
Hemoglobin: 13.9 g/dL (ref 13.0–17.7)
MCH: 32.4 pg (ref 26.6–33.0)
MCHC: 32.6 g/dL (ref 31.5–35.7)
MCV: 99 fL — ABNORMAL HIGH (ref 79–97)
Platelets: 186 10*3/uL (ref 150–450)
RBC: 4.29 x10E6/uL (ref 4.14–5.80)
RDW: 11.9 % (ref 11.6–15.4)
WBC: 9.1 10*3/uL (ref 3.4–10.8)

## 2022-09-05 ENCOUNTER — Telehealth: Payer: Self-pay | Admitting: *Deleted

## 2022-09-05 NOTE — Telephone Encounter (Signed)
Cardiac Catheterization scheduled at [facility] for: Thursday September 06, 2022 9 AM Arrival time [facility] Main Entrance A at: 7 AM  Nothing to eat after midnight prior to procedure, clear liquids until 5 AM day of procedure.  Medication instructions: -Usual morning medications can be taken with sips of water including aspirin 81 mg.  Confirmed patient has responsible adult to drive home post procedure and be with patient first 24 hours after arriving home.  Plan to go home the same day, you will only stay overnight if medically necessary.  Reviewed procedure instructions with patient.

## 2022-09-06 ENCOUNTER — Ambulatory Visit (HOSPITAL_COMMUNITY)
Admission: RE | Admit: 2022-09-06 | Discharge: 2022-09-06 | Disposition: A | Discharge status: Home / Self Care | Payer: Medicare Other | Attending: Cardiovascular Disease | Admitting: Cardiovascular Disease

## 2022-09-06 ENCOUNTER — Encounter (HOSPITAL_COMMUNITY)
Admission: RE | Disposition: A | Discharge status: Home / Self Care | Payer: Self-pay | Source: Home / Self Care | Attending: Cardiovascular Disease

## 2022-09-06 ENCOUNTER — Other Ambulatory Visit: Payer: Self-pay

## 2022-09-06 ENCOUNTER — Encounter (HOSPITAL_COMMUNITY): Payer: Self-pay | Admitting: Cardiovascular Disease

## 2022-09-06 DIAGNOSIS — I2510 Atherosclerotic heart disease of native coronary artery without angina pectoris: Secondary | ICD-10-CM

## 2022-09-06 DIAGNOSIS — I350 Nonrheumatic aortic (valve) stenosis: Secondary | ICD-10-CM

## 2022-09-06 DIAGNOSIS — E785 Hyperlipidemia, unspecified: Secondary | ICD-10-CM | POA: Insufficient documentation

## 2022-09-06 DIAGNOSIS — I252 Old myocardial infarction: Secondary | ICD-10-CM | POA: Diagnosis not present

## 2022-09-06 DIAGNOSIS — I480 Paroxysmal atrial fibrillation: Secondary | ICD-10-CM | POA: Insufficient documentation

## 2022-09-06 HISTORY — PX: RIGHT HEART CATH AND CORONARY ANGIOGRAPHY: CATH118264

## 2022-09-06 LAB — POCT I-STAT EG7
Acid-Base Excess: 2 mmol/L (ref 0.0–2.0)
Bicarbonate: 27.7 mmol/L (ref 20.0–28.0)
Calcium, Ion: 1.14 mmol/L — ABNORMAL LOW (ref 1.15–1.40)
HCT: 36 % — ABNORMAL LOW (ref 39.0–52.0)
Hemoglobin: 12.2 g/dL — ABNORMAL LOW (ref 13.0–17.0)
O2 Saturation: 72 %
Potassium: 3.7 mmol/L (ref 3.5–5.1)
Sodium: 141 mmol/L (ref 135–145)
TCO2: 29 mmol/L (ref 22–32)
pCO2, Ven: 47.5 mmHg (ref 44–60)
pH, Ven: 7.373 (ref 7.25–7.43)
pO2, Ven: 39 mmHg (ref 32–45)

## 2022-09-06 LAB — POCT I-STAT 7, (LYTES, BLD GAS, ICA,H+H)
Acid-Base Excess: 0 mmol/L (ref 0.0–2.0)
Bicarbonate: 25.1 mmol/L (ref 20.0–28.0)
Calcium, Ion: 1.13 mmol/L — ABNORMAL LOW (ref 1.15–1.40)
HCT: 36 % — ABNORMAL LOW (ref 39.0–52.0)
Hemoglobin: 12.2 g/dL — ABNORMAL LOW (ref 13.0–17.0)
O2 Saturation: 97 %
Potassium: 3.7 mmol/L (ref 3.5–5.1)
Sodium: 141 mmol/L (ref 135–145)
TCO2: 26 mmol/L (ref 22–32)
pCO2 arterial: 41.1 mmHg (ref 32–48)
pH, Arterial: 7.393 (ref 7.35–7.45)
pO2, Arterial: 89 mmHg (ref 83–108)

## 2022-09-06 SURGERY — RIGHT HEART CATH AND CORONARY ANGIOGRAPHY
Anesthesia: LOCAL

## 2022-09-06 MED ORDER — MIDAZOLAM HCL 2 MG/2ML IJ SOLN
INTRAMUSCULAR | Status: DC | PRN
  Administered 2022-09-06: 1 mg via INTRAVENOUS

## 2022-09-06 MED ORDER — IOHEXOL 350 MG/ML SOLN
INTRAVENOUS | Status: DC | PRN
  Administered 2022-09-06: 50 mL via INTRA_ARTERIAL

## 2022-09-06 MED ORDER — SODIUM CHLORIDE 0.9% FLUSH: 3.0000 mL | INTRAVENOUS | Status: DC | PRN

## 2022-09-06 MED ORDER — HEPARIN (PORCINE) IN NACL 1000-0.9 UT/500ML-% IV SOLN
INTRAVENOUS | Status: DC | PRN
  Administered 2022-09-06 (×2): 500 mL via INTRA_ARTERIAL

## 2022-09-06 MED ORDER — ASPIRIN 81 MG PO CHEW: 81.0000 mg | CHEWABLE_TABLET | ORAL | Status: DC

## 2022-09-06 MED ORDER — ACETAMINOPHEN 325 MG PO TABS: 650.0000 mg | ORAL_TABLET | ORAL | Status: DC | PRN

## 2022-09-06 MED ORDER — SODIUM CHLORIDE 0.9 % IV SOLN: INTRAVENOUS | Status: DC

## 2022-09-06 MED ORDER — SODIUM CHLORIDE 0.9 % WEIGHT BASED INFUSION
3.0000 mL/kg/h | INTRAVENOUS | Status: AC
  Administered 2022-09-06: 3 mL/kg/h via INTRAVENOUS

## 2022-09-06 MED ORDER — VERAPAMIL HCL 2.5 MG/ML IV SOLN
INTRAVENOUS | Status: AC
  Filled 2022-09-06: qty 2

## 2022-09-06 MED ORDER — SODIUM CHLORIDE 0.9 % IV SOLN: 250.0000 mL | INTRAVENOUS | Status: DC | PRN

## 2022-09-06 MED ORDER — FENTANYL CITRATE (PF) 100 MCG/2ML IJ SOLN
INTRAMUSCULAR | Status: AC
  Filled 2022-09-06: qty 2

## 2022-09-06 MED ORDER — SODIUM CHLORIDE 0.9 % WEIGHT BASED INFUSION: 1.0000 mL/kg/h | INTRAVENOUS | Status: DC

## 2022-09-06 MED ORDER — HEPARIN SODIUM (PORCINE) 1000 UNIT/ML IJ SOLN
INTRAMUSCULAR | Status: AC
  Filled 2022-09-06: qty 10

## 2022-09-06 MED ORDER — FENTANYL CITRATE (PF) 100 MCG/2ML IJ SOLN
INTRAMUSCULAR | Status: DC | PRN
  Administered 2022-09-06: 25 ug via INTRAVENOUS

## 2022-09-06 MED ORDER — HEPARIN SODIUM (PORCINE) 1000 UNIT/ML IJ SOLN
INTRAMUSCULAR | Status: DC | PRN
  Administered 2022-09-06: 3500 [IU] via INTRAVENOUS

## 2022-09-06 MED ORDER — SODIUM CHLORIDE 0.9% FLUSH: 3.0000 mL | Freq: Two times a day (BID) | INTRAVENOUS | Status: DC

## 2022-09-06 MED ORDER — HYDRALAZINE HCL 20 MG/ML IJ SOLN: 10.0000 mg | INTRAMUSCULAR | Status: DC | PRN

## 2022-09-06 MED ORDER — LABETALOL HCL 5 MG/ML IV SOLN: 10.0000 mg | INTRAVENOUS | Status: DC | PRN

## 2022-09-06 MED ORDER — LIDOCAINE HCL (PF) 1 % IJ SOLN
INTRAMUSCULAR | Status: AC
  Filled 2022-09-06: qty 30

## 2022-09-06 MED ORDER — MIDAZOLAM HCL 2 MG/2ML IJ SOLN
INTRAMUSCULAR | Status: AC
  Filled 2022-09-06: qty 2

## 2022-09-06 MED ORDER — ONDANSETRON HCL 4 MG/2ML IJ SOLN: 4.0000 mg | Freq: Four times a day (QID) | INTRAMUSCULAR | Status: DC | PRN

## 2022-09-06 MED ORDER — LIDOCAINE HCL (PF) 1 % IJ SOLN
INTRAMUSCULAR | Status: DC | PRN
  Administered 2022-09-06: 5 mL
  Administered 2022-09-06: 2 mL

## 2022-09-06 MED ORDER — VERAPAMIL HCL 2.5 MG/ML IV SOLN
INTRAVENOUS | Status: DC | PRN
  Administered 2022-09-06: 10 mL via INTRA_ARTERIAL

## 2022-09-06 SURGICAL SUPPLY — 14 items
BAND CMPR LRG ZPHR (HEMOSTASIS) ×1
BAND ZEPHYR COMPRESS 30 LONG (HEMOSTASIS) IMPLANT
CATH 5FR JL3.5 JR4 ANG PIG MP (CATHETERS) IMPLANT
CATH BALLN WEDGE 5F 110CM (CATHETERS) IMPLANT
GLIDESHEATH SLEND SS 6F .021 (SHEATH) IMPLANT
GUIDEWIRE .025 260CM (WIRE) IMPLANT
GUIDEWIRE INQWIRE 1.5J.035X260 (WIRE) IMPLANT
INQWIRE 1.5J .035X260CM (WIRE) ×1
KIT HEART LEFT (KITS) ×1 IMPLANT
PACK CARDIAC CATHETERIZATION (CUSTOM PROCEDURE TRAY) ×1 IMPLANT
SHEATH GLIDE SLENDER 4/5FR (SHEATH) IMPLANT
TRANSDUCER W/STOPCOCK (MISCELLANEOUS) ×1 IMPLANT
TUBING CIL FLEX 10 FLL-RA (TUBING) ×1 IMPLANT
WIRE HI TORQ VERSACORE-J 145CM (WIRE) IMPLANT

## 2022-09-06 NOTE — Interval H&P Note (Signed)
Cath Lab Visit (complete for each Cath Lab visit)  Clinical Evaluation Leading to the Procedure:   ACS: No.  Non-ACS:    Anginal Classification: CCS I  Anti-ischemic medical therapy: Minimal Therapy (1 class of medications)  Non-Invasive Test Results: No non-invasive testing performed  Prior CABG: No previous CABG      History and Physical Interval Note:  09/06/2022 7:17 AM  [name] [name]  has presented today for surgery, with the diagnosis of severe aortic stenosis.  The various methods of treatment have been discussed with the patient and family. After consideration of risks, benefits and other options for treatment, the patient has consented to  Procedure(s): RIGHT/LEFT HEART CATH AND CORONARY ANGIOGRAPHY (N/A) as a surgical intervention.  The patient's history has been reviewed, patient examined, no change in status, stable for surgery.  I have reviewed the patient's chart and labs.  Questions were answered to the patient's satisfaction.     Verne Carrow

## 2022-09-12 ENCOUNTER — Ambulatory Visit (HOSPITAL_COMMUNITY)
Admission: RE | Admit: 2022-09-12 | Discharge: 2022-09-12 | Disposition: A | Discharge status: Home / Self Care | Payer: Medicare Other | Source: Ambulatory Visit | Attending: Cardiovascular Disease | Admitting: Cardiovascular Disease

## 2022-09-12 DIAGNOSIS — I350 Nonrheumatic aortic (valve) stenosis: Secondary | ICD-10-CM | POA: Diagnosis present

## 2022-09-12 MED ORDER — NITROGLYCERIN 0.4 MG SL SUBL: 0.8000 mg | SUBLINGUAL_TABLET | Freq: Once | SUBLINGUAL | Status: DC

## 2022-09-12 MED ORDER — IOHEXOL 350 MG/ML SOLN
100.0000 mL | Freq: Once | INTRAVENOUS | Status: AC | PRN
  Administered 2022-09-12: 100 mL via INTRAVENOUS

## 2022-09-16 NOTE — Progress Notes (Unsigned)
[address]       [place] [address]             [phone]           [facility] Medical Record #[id number] Date of Birth: 06-29-1933  [name], MD  Chief Complaint: DOE   History of Present Illness:     Pt is a very pleasant and active 89 yo wm who over the past year has developed increasing DOE and along with that, lightheadedness but no syncope. Pt also feels intermittent fluttering in his chest that last happened 3 weeks ago. Pt has had work up with Dr Tenny Craw with TTE revealing severe AS with a mean gradient of and a AVA of 1.12cm2 but with a DVI of 0.29 and SVI of 39. Pt has an EF of 50%. He was felt to have parodoxical low flow low gradient AS and with very calcified Aortic valve leaflets that has poor movement on echo. Pt has a history of CAD with flow restored in OM after wire passed and recent cath with no CAD. Pt has had CTA and with anatomy for a TAVR sapien valve of 29mm. With access being Left femoral.   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 He lives with family and his grandaughter plans on attending his procedure and stay with him several days when he is home     Past Medical History:  Diagnosis Date   Bradycardia    relative, precluding up titration of his beta-blocker   CAD (coronary artery disease)    HLD (hyperlipidemia)    Non-ST elevated myocardial infarction (non-STEMI) (HCC)    2/07: LHC with occluded OM, LAD 20% + 40%; normal LVF => PTCA of OM with wire passage alone    PVC's (premature ventricular contractions)    Syncope    hx    Past Surgical History:  Procedure Laterality Date   APPLICATION OF WOUND VAC N/A 10/17/2019   Procedure: APPLICATION OF WOUND VAC;  Surgeon: Henrene Dodge, MD;  Location: [facility];  Service: General;  Laterality: N/A;  [id number]    APPLICATION OF WOUND VAC  10/18/2019   Procedure:  APPLICATION OF WOUND VAC;  Surgeon: Henrene Dodge, MD;  Location: [facility];  Service: General;;   BOWEL RESECTION N/A 10/17/2019   Procedure: SMALL BOWEL RESECTION;  Surgeon: Henrene Dodge, MD;  Location: [facility];  Service: General;  Laterality: N/A;   BOWEL RESECTION  10/18/2019   Procedure: SMALL BOWEL RESECTION;  Surgeon: Henrene Dodge, MD;  Location: [facility];  Service: General;;   CARDIAC CATHETERIZATION     coronary arteriogram     EXPLORATORY LAPAROTOMY  1970s   for bleeding peptic ulcer   LAPAROTOMY N/A 10/17/2019   Procedure: EXPLORATORY LAPAROTOMY;  Surgeon: Henrene Dodge, MD;  Location: [facility];  Service: General;  Laterality: N/A;   LAPAROTOMY N/A 10/18/2019   Procedure: EXPLORATORY LAPAROTOMY;  Surgeon: Henrene Dodge, MD;  Location: [facility];  Service: General;  Laterality: N/A;   left ventriculogram     RIGHT HEART CATH AND CORONARY ANGIOGRAPHY N/A 09/06/2022   Procedure: RIGHT HEART CATH AND CORONARY ANGIOGRAPHY;  Surgeon: Kathleene Hazel, MD;  Location: [facility];  Service: Cardiovascular;  Laterality: N/A;    Social History   Tobacco Use  Smoking Status Never  Smokeless Tobacco Former   Types: Sports administrator  Social History   Substance and Sexual Activity  Alcohol Use No   Alcohol/week: 0.0 standard drinks of alcohol    Social History   Socioeconomic History   Marital status: Divorced    Spouse name: Not on file   Number of children: 3   Years of education: Not on file   Highest education level: Not on file  Occupational History   Occupation: Retired Naval architect  Tobacco Use   Smoking status: Never   Smokeless tobacco: Former    Types: Associate Professor Use: Never used  Substance and Sexual Activity   Alcohol use: No    Alcohol/week: 0.0 standard drinks of alcohol   Drug use: No   Sexual activity: Not on file  Other Topics Concern   Not on file  Social History Narrative   He is married, divorced, and now leaving with his  ex-wife. Continues to drive tractor-trailers.    Social Determinants of Health   Financial Resource Strain: Not on file  Food Insecurity: Not on file  Transportation Needs: Not on file  Physical Activity: Not on file  Stress: Not on file  Social Connections: Not on file  Intimate Partner Violence: Not on file    Allergies  Allergen Reactions   Tetanus Toxoid, Adsorbed     Tetanus shot - altered mental state   Tetanus Toxoids     Tetanus shot - altered mental state    Current Outpatient Medications  Medication Sig Dispense Refill   BEE POLLEN PO Take 3 capsules by mouth daily.     loratadine (CLARITIN) 10 MG tablet Take 10 mg by mouth daily as needed for allergies.     metoprolol succinate (TOPROL-XL) 25 MG 24 hr tablet TAKE 1 TABLET(25 MG) BY MOUTH DAILY 90 tablet 3   nitroGLYCERIN (NITROSTAT) 0.4 MG SL tablet place 1 tablet under the tongue if needed every 5 minutes for chest pain for 3 doses IF NO RELIEF AFTER 3RD DOSE CALL PRESCRIBER OR 911. 25 tablet 2   Omega-3 Fatty Acids (OMEGA 3 PO) Take 2 capsules by mouth daily. Omega XL     rosuvastatin (CRESTOR) 40 MG tablet Take 1 tablet (40 mg total) by mouth daily. 90 tablet 3   No current facility-administered medications for this visit.     Family History  Problem Relation Age of Onset   Heart attack Mother 50   Heart attack Father 60       Physical Exam: Heathy appearing with teeth in good repair Lungs; clear Card: RR with harsh systolic murmur Ext: no edema Neuro: alert without focal deficits     Diagnostic Studies & Laboratory data: I have personally reviewed the following studies and agree with the findings   TTE (07/2022) IMPRESSIONS     1. Left ventricular ejection fraction, by estimation, is 50 to 55%. The  left ventricle has low normal function. The left ventricle has no regional  wall motion abnormalities. There is mild concentric left ventricular  hypertrophy. Left ventricular  diastolic  parameters are consistent with Grade I diastolic dysfunction  (impaired relaxation).   2. Right ventricular systolic function is normal. The right ventricular  size is normal. There is normal pulmonary artery systolic pressure.   3. Trivial mitral valve regurgitation.   4. LVOT may be overestimated, SVI closer to ~39 borderline, AVA may also  be overestimated. DI 0.29. AV mean gradient 24 mmHg. . The aortic valve is  calcified. Aortic valve regurgitation is trivial.  Moderate to severe  aortic valve stenosis.   5. The inferior vena cava is normal in size with greater than 50%  respiratory variability, suggesting right atrial pressure of 3 mmHg.   Comparison(s): No significant change from prior study.   Conclusion(s)/Recommendation(s): Mod-severe AS, can consider cardiac CT.   FINDINGS   Left Ventricle: Left ventricular ejection fraction, by estimation, is 50  to 55%. The left ventricle has low normal function. The left ventricle has  no regional wall motion abnormalities. Global longitudinal strain  performed but not reported based on  interpreter judgement due to suboptimal tracking. The left ventricular  internal cavity size was normal in size. There is mild concentric left  ventricular hypertrophy. Left ventricular diastolic parameters are  consistent with Grade I diastolic dysfunction   (impaired relaxation).   Right Ventricle: The right ventricular size is normal. Right ventricular  systolic function is normal. There is normal pulmonary artery systolic  pressure. The tricuspid regurgitant velocity is 2.23 m/s, and with an  assumed right atrial pressure of 3 mmHg,   the estimated right ventricular systolic pressure is 22.9 mmHg.   Left Atrium: Left atrial size was normal in size.   Right Atrium: Right atrial size was normal in size.   Pericardium: There is no evidence of pericardial effusion.   Mitral Valve: Mild to moderate mitral annular calcification. Trivial  mitral  valve regurgitation.   Tricuspid Valve: Tricuspid valve regurgitation is mild.   Aortic Valve: LVOT may be overestimated, SVI closer to ~39 borderline, AVA  may also be overestimated. DI 0.29. AV mean gradient 24 mmHg. The aortic  valve is calcified. Aortic valve regurgitation is trivial. Aortic  regurgitation PHT measures 346 msec.  Moderate to severe aortic stenosis is present. Aortic valve mean gradient  measures 24.0 mmHg. Aortic valve peak gradient measures 37.9 mmHg. Aortic  valve area, by VTI measures 1.12 cm.   Pulmonic Valve: Pulmonic valve regurgitation is mild.   Aorta: The aortic root and ascending aorta are structurally normal, with  no evidence of dilitation.   Venous: The inferior vena cava is normal in size with greater than 50%  respiratory variability, suggesting right atrial pressure of 3 mmHg.   IAS/Shunts: No atrial level shunt detected by color flow Doppler.     LEFT VENTRICLE  PLAX 2D  LVIDd:         4.70 cm   Diastology  LVIDs:         3.50 cm   LV e' medial:    7.83 cm/s  LV PW:         1.10 cm   LV E/e' medial:  10.1  LV IVS:        1.30 cm   LV e' lateral:   8.27 cm/s  LVOT diam:     2.20 cm   LV E/e' lateral: 9.6  LV SV:         88  LV SV Index:   47        2D Longitudinal Strain  LVOT Area:     3.80 cm  2D Strain GLS (A2C):   -19.7 %                           2D Strain GLS (A3C):   -13.0 %                           2D Strain GLS (  A4C):   -17.6 %                           2D Strain GLS Avg:     -16.8 %   RIGHT VENTRICLE  RV Basal diam:  3.80 cm  RV S prime:     9.03 cm/s  TAPSE (M-mode): 1.8 cm  RVSP:           22.9 mmHg   LEFT ATRIUM             Index        RIGHT ATRIUM           Index  LA diam:        3.70 cm 2.00 cm/m   RA Pressure: 3.00 mmHg  LA Vol (A2C):   45.7 ml 24.72 ml/m  RA Area:     13.40 cm  LA Vol (A4C):   48.9 ml 26.45 ml/m  RA Volume:   32.40 ml  17.52 ml/m  LA Biplane Vol: 49.0 ml 26.50 ml/m   AORTIC VALVE  AV Area  (Vmax):    0.85 cm  AV Area (Vmean):   0.80 cm  AV Area (VTI):     1.12 cm  AV Vmax:           308.00 cm/s  AV Vmean:          228.000 cm/s  AV VTI:            0.787 m  AV Peak Grad:      37.9 mmHg  AV Mean Grad:      24.0 mmHg  LVOT Vmax:         68.70 cm/s  LVOT Vmean:        48.200 cm/s  LVOT VTI:          0.231 m  LVOT/AV VTI ratio: 0.29  AI PHT:            346 msec    AORTA  Ao Root diam: 3.60 cm  Ao Asc diam:  3.70 cm   MITRAL VALVE                TRICUSPID VALVE  MV Area (PHT):              TR Peak grad:   19.9 mmHg  MV Decel Time:              TR Vmax:        223.00 cm/s  MV E velocity: 79.10 cm/s   Estimated RAP:  3.00 mmHg  MV A velocity: 105.00 cm/s  RVSP:           22.9 mmHg  MV E/A ratio:  0.75                              SHUNTS                              Systemic VTI:  0.23 m                              Systemic Diam: 2.20 cm   CATH (08/2022) Conclusion      Mid RCA lesion is 20% stenosed.   1st Mrg lesion is 20% stenosed.   Ost LM to Mid  LM lesion is 20% stenosed.   Ost LAD to Prox LAD lesion is 30% stenosed.   Mid LAD lesion is 40% stenosed.   2nd Diag lesion is 40% stenosed.   Mild non-obstructive CAD Normal right heart pressures Severe aortic stenosis by echo   Recent Radiology Findings:   TAVR CTA (08/2022) FINDINGS: Aortic Valve:   Tricuspid aortic valve with severely reduced cusp excursion. Severely thickened and severely calcified aortic valve cusps.   AV calcium score: 3105   Virtual Basal Annulus Measurements:   Maximum/Minimum Diameter: 30.1 x 26.7 mm   Perimeter: 88.9 mm   Area:  615 mm2   Mild LVOT calcifications below LCC.   Membranous septal length: 5.5 mm   Based on these measurements, the annulus would be suitable for a 29 mm Sapien 3 valve. Alternatively, Heart Team can consider 34 mm Evolut valve. Recommend Heart Team discussion for valve selection.   Sinus of Valsalva Measurements:   Non-coronary:  37 mm    Right - coronary:  35 mm   Left - coronary:  36 mm   Sinus of Valsalva Height:   Left: 29 mm   Right: 25.6 mm   Aorta: Conventional 3 vessel branch pattern of aortic arch.   Sinotubular Junction:  31 mm   Ascending Thoracic Aorta: 34.5 mm   Aortic Arch:  26 mm   Descending Thoracic Aorta:  25 mm   Coronary Artery Height above Annulus:   Left main: 22.8 mm   Right coronary: 20.5 mm   Coronary Arteries: Normal coronary origin. Right dominance. The study was performed without use of NTG and insufficient for plaque evaluation. Coronary artery calcifications.   Optimum Fluoroscopic Angle for Delivery: LAO 1 CAU 1   OTHER:   Left atrial appendage: No thrombus.   Mitral valve: Grossly normal, mild mitral annular calcifications.   Pulmonary artery: Normal caliber.   Pulmonary veins: Normal anatomy.   IMPRESSION: 1. Tricuspid aortic valve with severely reduced cusp excursion. Severely thickened and severely calcified aortic valve cusps. 2. Aortic valve calcium score: 3105 3. Annulus area: 615 mm2, suitable for 29 mm Sapien 3 valve. Mild LVOT calcifications. Membranous septal length 5.5 mm. 4. Sufficient coronary artery heights from annulus. 5. Optimum Fluoroscopic Angle for Delivery: LAO 1 CAU 1      Recent Lab Findings: Lab Results  Component Value Date   WBC 9.1 08/31/2022   HGB 12.2 (L) 09/06/2022   HCT 36.0 (L) 09/06/2022   PLT 186 08/31/2022   GLUCOSE 105 (H) 08/31/2022   CHOL 148 01/18/2020   TRIG 138 01/18/2020   HDL 62 01/18/2020   LDLCALC 62 01/18/2020   ALT 16 01/18/2020   AST 21 01/18/2020   NA 141 09/06/2022   K 3.7 09/06/2022   CL 99 08/31/2022   CREATININE 0.71 (L) 08/31/2022   BUN 18 08/31/2022   CO2 25 08/31/2022   TSH 1.250 01/18/2020   INR 1.1 10/20/2019      Assessment / Plan:     Pt with NYHA class 2 symptoms of severe paradoxical low gradient AS and with preserved LV function and no CAD. Pt without significant underlying  conduction issues. Pt is a candidate for AVR and with his age alone would favor TAVR and he understands the risks, goals, and recovery and wishes to proceed. Pt would benefit from a 29 sapien valve via the Left femoral access side and with his overall good health would be considered a bail out candidate depending on circumstances.  I have spent 60 min in review of the records, viewing studies and in face to face with patient and in coordination of future care    Eugenio Hoes 09/16/2022 10:17 AM

## 2022-09-17 ENCOUNTER — Encounter: Payer: Self-pay | Admitting: Thoracic Surgery (Cardiothoracic Vascular Surgery)

## 2022-09-17 ENCOUNTER — Institutional Professional Consult (permissible substitution): Payer: Medicare Other | Admitting: Thoracic Surgery (Cardiothoracic Vascular Surgery)

## 2022-09-17 VITALS — BP 112/69 | HR 62 | Resp 20 | Ht 67.0 in | Wt 164.0 lb

## 2022-09-17 DIAGNOSIS — I350 Nonrheumatic aortic (valve) stenosis: Secondary | ICD-10-CM | POA: Diagnosis not present

## 2022-09-17 NOTE — Patient Instructions (Signed)
TAVR

## 2022-09-21 ENCOUNTER — Other Ambulatory Visit: Payer: Self-pay

## 2022-09-21 DIAGNOSIS — I350 Nonrheumatic aortic (valve) stenosis: Secondary | ICD-10-CM

## 2022-09-28 ENCOUNTER — Ambulatory Visit (HOSPITAL_COMMUNITY)
Admission: RE | Admit: 2022-09-28 | Discharge: 2022-09-28 | Disposition: A | Discharge status: Home / Self Care | Payer: Medicare Other | Source: Ambulatory Visit | Attending: Cardiovascular Disease | Admitting: Cardiovascular Disease

## 2022-09-28 ENCOUNTER — Encounter (HOSPITAL_COMMUNITY)
Admission: RE | Admit: 2022-09-28 | Discharge: 2022-09-28 | Disposition: A | Discharge status: Home / Self Care | Payer: Medicare Other | Source: Ambulatory Visit | Attending: Cardiovascular Disease | Admitting: Cardiovascular Disease

## 2022-09-28 ENCOUNTER — Other Ambulatory Visit: Payer: Self-pay

## 2022-09-28 DIAGNOSIS — I350 Nonrheumatic aortic (valve) stenosis: Secondary | ICD-10-CM | POA: Insufficient documentation

## 2022-09-28 DIAGNOSIS — Z01818 Encounter for other preprocedural examination: Secondary | ICD-10-CM | POA: Insufficient documentation

## 2022-09-28 DIAGNOSIS — I493 Ventricular premature depolarization: Secondary | ICD-10-CM | POA: Insufficient documentation

## 2022-09-28 DIAGNOSIS — Z1152 Encounter for screening for COVID-19: Secondary | ICD-10-CM | POA: Insufficient documentation

## 2022-09-28 LAB — URINALYSIS, ROUTINE W REFLEX MICROSCOPIC
Bilirubin Urine: NEGATIVE
Glucose, UA: NEGATIVE mg/dL
Hgb urine dipstick: NEGATIVE
Ketones, ur: NEGATIVE mg/dL
Leukocytes,Ua: NEGATIVE
Nitrite: NEGATIVE
Protein, ur: NEGATIVE mg/dL
Specific Gravity, Urine: 1.015 (ref 1.005–1.030)
pH: 6 (ref 5.0–8.0)

## 2022-09-28 LAB — CBC
HCT: 43.4 % (ref 39.0–52.0)
Hemoglobin: 13.9 g/dL (ref 13.0–17.0)
MCH: 32.3 pg (ref 26.0–34.0)
MCHC: 32 g/dL (ref 30.0–36.0)
MCV: 100.9 fL — ABNORMAL HIGH (ref 80.0–100.0)
Platelets: 167 10*3/uL (ref 150–400)
RBC: 4.3 MIL/uL (ref 4.22–5.81)
RDW: 11.8 % (ref 11.5–15.5)
WBC: 8.7 10*3/uL (ref 4.0–10.5)
nRBC: 0 % (ref 0.0–0.2)

## 2022-09-28 LAB — COMPREHENSIVE METABOLIC PANEL
ALT: 18 U/L (ref 0–44)
AST: 20 U/L (ref 15–41)
Albumin: 3.9 g/dL (ref 3.5–5.0)
Alkaline Phosphatase: 63 U/L (ref 38–126)
Anion gap: 10 (ref 5–15)
BUN: 15 mg/dL (ref 8–23)
CO2: 26 mmol/L (ref 22–32)
Calcium: 9.2 mg/dL (ref 8.9–10.3)
Chloride: 101 mmol/L (ref 98–111)
Creatinine, Ser: 0.73 mg/dL (ref 0.61–1.24)
GFR, Estimated: >60 mL/min (ref >60)
Glucose, Bld: 123 mg/dL — ABNORMAL HIGH (ref 70–99)
Potassium: 4.2 mmol/L (ref 3.5–5.1)
Sodium: 137 mmol/L (ref 135–145)
Total Bilirubin: 0.9 mg/dL (ref 0.3–1.2)
Total Protein: 6.9 g/dL (ref 6.5–8.1)

## 2022-09-28 LAB — TYPE AND SCREEN
ABO/RH(D): A POS
Antibody Screen: NEGATIVE

## 2022-09-28 LAB — PROTIME-INR
INR: 1.1 (ref 0.8–1.2)
Prothrombin Time: 13.9 seconds (ref 11.4–15.2)

## 2022-09-28 LAB — SURGICAL PCR SCREEN
MRSA, PCR: NEGATIVE
Staphylococcus aureus: NEGATIVE

## 2022-09-28 LAB — SARS CORONAVIRUS 2 (TAT 6-24 HRS): SARS Coronavirus 2: NEGATIVE

## 2022-09-28 NOTE — Progress Notes (Signed)
Patient signed all consents at PAT lab appointment. CHG soap and instructions were given to patient. CHG surgical prep reviewed with patient and all questions answered.  Pt does endorse a runny nose related to seasonal allergies with a possible sinus infection about one month ago. Julieta Gutting, RN with TAVR team made aware.

## 2022-10-01 MED ORDER — POTASSIUM CHLORIDE 2 MEQ/ML IV SOLN
80.0000 meq | INTRAVENOUS | Status: DC
  Filled 2022-10-01 (×2): qty 40

## 2022-10-01 MED ORDER — DEXMEDETOMIDINE HCL IN NACL 400 MCG/100ML IV SOLN
0.1000 ug/kg/h | INTRAVENOUS | Status: AC
  Administered 2022-10-02: 37.2 ug via INTRAVENOUS
  Filled 2022-10-01 (×2): qty 100

## 2022-10-01 MED ORDER — CEFAZOLIN SODIUM-DEXTROSE 2-4 GM/100ML-% IV SOLN
2.0000 g | INTRAVENOUS | Status: AC
  Administered 2022-10-02: 2 g via INTRAVENOUS
  Filled 2022-10-01 (×2): qty 100

## 2022-10-01 MED ORDER — MAGNESIUM SULFATE 50 % IJ SOLN
40.0000 meq | INTRAMUSCULAR | Status: DC
  Filled 2022-10-01 (×2): qty 9.85

## 2022-10-01 MED ORDER — NOREPINEPHRINE 4 MG/250ML-% IV SOLN
0.0000 ug/min | INTRAVENOUS | Status: AC
  Administered 2022-10-02: 2 ug/min via INTRAVENOUS
  Filled 2022-10-01: qty 250

## 2022-10-01 MED ORDER — HEPARIN 30,000 UNITS/1000 ML (OHS) CELLSAVER SOLUTION
Status: DC
  Filled 2022-10-01 (×2): qty 1000

## 2022-10-01 NOTE — H&P (Signed)
[address]       [place] [address]             [phone]                                   [name] [name] [facility] Medical Record #[id number] Date of Birth: 1933-05-31   [name] Lords, MD   Chief Complaint: DOE    History of Present Illness:     Pt is a very pleasant and active 88 yo wm who over the past year has developed increasing DOE and along with that, lightheadedness but no syncope. Pt also feels intermittent fluttering in his chest that last happened 3 weeks ago. Pt has had work up with Dr Tenny Craw with TTE revealing severe AS with a mean gradient of and a AVA of 1.12cm2 but with a DVI of 0.29 and SVI of 39. Pt has an EF of 50%. He was felt to have parodoxical low flow low gradient AS and with very calcified Aortic valve leaflets that has poor movement on echo. Pt has a history of CAD with flow restored in OM after wire passed and recent cath with no CAD. Pt has had CTA and with anatomy for a TAVR sapien valve of 29mm. With access being Left femoral.   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 He lives with family and his grandaughter plans on attending his procedure and stay with him several days when he is home           Past Medical History:  Diagnosis Date   Bradycardia      relative, precluding up titration of his beta-blocker   CAD (coronary artery disease)     HLD (hyperlipidemia)     Non-ST elevated myocardial infarction (non-STEMI) (HCC)      2/07: LHC with occluded OM, LAD 20% + 40%; normal LVF => PTCA of OM with wire passage alone    PVC's (premature ventricular contractions)     Syncope      hx           Past Surgical History:  Procedure Laterality Date   APPLICATION OF WOUND VAC N/A 10/17/2019    Procedure: APPLICATION OF WOUND VAC;  Surgeon: Henrene Dodge, MD;  Location: [facility] OR[facility];  Service: General;  Laterality: N/A;  [id number]      APPLICATION OF WOUND VAC   10/18/2019    Procedure: APPLICATION OF WOUND VAC;  Surgeon: Henrene Dodge, MD;  Location: [facility];  Service: General;;   BOWEL RESECTION N/A 10/17/2019    Procedure: SMALL BOWEL RESECTION;  Surgeon: Henrene Dodge, MD;  Location: [facility] OR[facility];  Service: General;  Laterality: N/A;   BOWEL RESECTION   10/18/2019    Procedure: SMALL BOWEL RESECTION;  Surgeon: Henrene Dodge, MD;  Location: [facility];  Service: General;;   CARDIAC CATHETERIZATION       coronary arteriogram       EXPLORATORY LAPAROTOMY   1970s    for bleeding peptic ulcer   LAPAROTOMY N/A 10/17/2019    Procedure: EXPLORATORY LAPAROTOMY;  Surgeon: Henrene Dodge, MD;  Location: [facility];  Service: General;  Laterality: N/A;   LAPAROTOMY N/A 10/18/2019    Procedure: EXPLORATORY LAPAROTOMY;  Surgeon: Henrene Dodge, MD;  Location: [facility];  Service: General;  Laterality: N/A;  left ventriculogram       RIGHT HEART CATH AND CORONARY ANGIOGRAPHY N/A 09/06/2022    Procedure: RIGHT HEART CATH AND CORONARY ANGIOGRAPHY;  Surgeon: Kathleene Hazel, MD;  Location: [facility];  Service: Cardiovascular;  Laterality: N/A;      Social History        Tobacco Use  Smoking Status Never  Smokeless Tobacco Former   Types: Chew    Social History        Substance and Sexual Activity  Alcohol Use No   Alcohol/week: 0.0 standard drinks of alcohol      Social History         Socioeconomic History   Marital status: Divorced      Spouse name: Not on file   Number of children: 3   Years of education: Not on file   Highest education level: Not on file  Occupational History   Occupation: Retired Naval architect  Tobacco Use   Smoking status: Never   Smokeless tobacco: Former      Types: Associate Professor Use: Never used  Substance and Sexual Activity   Alcohol use: No      Alcohol/week: 0.0 standard drinks of alcohol   Drug use: No   Sexual activity: Not on file  Other Topics Concern    Not on file  Social History Narrative    He is married, divorced, and now leaving with his ex-wife. Continues to drive tractor-trailers.     Social Determinants of Health    Financial Resource Strain: Not on file  Food Insecurity: Not on file  Transportation Needs: Not on file  Physical Activity: Not on file  Stress: Not on file  Social Connections: Not on file  Intimate Partner Violence: Not on file           Allergies  Allergen Reactions   Tetanus Toxoid, Adsorbed        Tetanus shot - altered mental state   Tetanus Toxoids        Tetanus shot - altered mental state            Current Outpatient Medications  Medication Sig Dispense Refill   BEE POLLEN PO Take 3 capsules by mouth daily.       loratadine (CLARITIN) 10 MG tablet Take 10 mg by mouth daily as needed for allergies.       metoprolol succinate (TOPROL-XL) 25 MG 24 hr tablet TAKE 1 TABLET(25 MG) BY MOUTH DAILY 90 tablet 3   nitroGLYCERIN (NITROSTAT) 0.4 MG SL tablet place 1 tablet under the tongue if needed every 5 minutes for chest pain for 3 doses IF NO RELIEF AFTER 3RD DOSE CALL PRESCRIBER OR 911. 25 tablet 2   Omega-3 Fatty Acids (OMEGA 3 PO) Take 2 capsules by mouth daily. Omega XL       rosuvastatin (CRESTOR) 40 MG tablet Take 1 tablet (40 mg total) by mouth daily. 90 tablet 3    No current facility-administered medications for this visit.             Family History  Problem Relation Age of Onset   Heart attack Mother 80   Heart attack Father 1            Physical Exam: Heathy appearing with teeth in good repair Lungs; clear Card: RR with harsh systolic murmur Ext: no edema Neuro: alert without focal deficits         Diagnostic Studies & Laboratory  data: I have personally reviewed the following studies and agree with the findings   TTE (07/2022) IMPRESSIONS     1. Left ventricular ejection fraction, by estimation, is 50 to 55%. The  left ventricle has low normal function. The left  ventricle has no regional  wall motion abnormalities. There is mild concentric left ventricular  hypertrophy. Left ventricular  diastolic parameters are consistent with Grade I diastolic dysfunction  (impaired relaxation).   2. Right ventricular systolic function is normal. The right ventricular  size is normal. There is normal pulmonary artery systolic pressure.   3. Trivial mitral valve regurgitation.   4. LVOT may be overestimated, SVI closer to ~39 borderline, AVA may also  be overestimated. DI 0.29. AV mean gradient 24 mmHg. . The aortic valve is  calcified. Aortic valve regurgitation is trivial. Moderate to severe  aortic valve stenosis.   5. The inferior vena cava is normal in size with greater than 50%  respiratory variability, suggesting right atrial pressure of 3 mmHg.   Comparison(s): No significant change from prior study.   Conclusion(s)/Recommendation(s): Mod-severe AS, can consider cardiac CT.   FINDINGS   Left Ventricle: Left ventricular ejection fraction, by estimation, is 50  to 55%. The left ventricle has low normal function. The left ventricle has  no regional wall motion abnormalities. Global longitudinal strain  performed but not reported based on  interpreter judgement due to suboptimal tracking. The left ventricular  internal cavity size was normal in size. There is mild concentric left  ventricular hypertrophy. Left ventricular diastolic parameters are  consistent with Grade I diastolic dysfunction   (impaired relaxation).   Right Ventricle: The right ventricular size is normal. Right ventricular  systolic function is normal. There is normal pulmonary artery systolic  pressure. The tricuspid regurgitant velocity is 2.23 m/s, and with an  assumed right atrial pressure of 3 mmHg,   the estimated right ventricular systolic pressure is 22.9 mmHg.   Left Atrium: Left atrial size was normal in size.   Right Atrium: Right atrial size was normal in size.    Pericardium: There is no evidence of pericardial effusion.   Mitral Valve: Mild to moderate mitral annular calcification. Trivial  mitral valve regurgitation.   Tricuspid Valve: Tricuspid valve regurgitation is mild.   Aortic Valve: LVOT may be overestimated, SVI closer to ~39 borderline, AVA  may also be overestimated. DI 0.29. AV mean gradient 24 mmHg. The aortic  valve is calcified. Aortic valve regurgitation is trivial. Aortic  regurgitation PHT measures 346 msec.  Moderate to severe aortic stenosis is present. Aortic valve mean gradient  measures 24.0 mmHg. Aortic valve peak gradient measures 37.9 mmHg. Aortic  valve area, by VTI measures 1.12 cm.   Pulmonic Valve: Pulmonic valve regurgitation is mild.   Aorta: The aortic root and ascending aorta are structurally normal, with  no evidence of dilitation.   Venous: The inferior vena cava is normal in size with greater than 50%  respiratory variability, suggesting right atrial pressure of 3 mmHg.   IAS/Shunts: No atrial level shunt detected by color flow Doppler.     LEFT VENTRICLE  PLAX 2D  LVIDd:         4.70 cm   Diastology  LVIDs:         3.50 cm   LV e' medial:    7.83 cm/s  LV PW:         1.10 cm   LV E/e' medial:  10.1  LV IVS:  1.30 cm   LV e' lateral:   8.27 cm/s  LVOT diam:     2.20 cm   LV E/e' lateral: 9.6  LV SV:         88  LV SV Index:   47        2D Longitudinal Strain  LVOT Area:     3.80 cm  2D Strain GLS (A2C):   -19.7 %                           2D Strain GLS (A3C):   -13.0 %                           2D Strain GLS (A4C):   -17.6 %                           2D Strain GLS Avg:     -16.8 %   RIGHT VENTRICLE  RV Basal diam:  3.80 cm  RV S prime:     9.03 cm/s  TAPSE (M-mode): 1.8 cm  RVSP:           22.9 mmHg   LEFT ATRIUM             Index        RIGHT ATRIUM           Index  LA diam:        3.70 cm 2.00 cm/m   RA Pressure: 3.00 mmHg  LA Vol (A2C):   45.7 ml 24.72 ml/m  RA Area:      13.40 cm  LA Vol (A4C):   48.9 ml 26.45 ml/m  RA Volume:   32.40 ml  17.52 ml/m  LA Biplane Vol: 49.0 ml 26.50 ml/m   AORTIC VALVE  AV Area (Vmax):    0.85 cm  AV Area (Vmean):   0.80 cm  AV Area (VTI):     1.12 cm  AV Vmax:           308.00 cm/s  AV Vmean:          228.000 cm/s  AV VTI:            0.787 m  AV Peak Grad:      37.9 mmHg  AV Mean Grad:      24.0 mmHg  LVOT Vmax:         68.70 cm/s  LVOT Vmean:        48.200 cm/s  LVOT VTI:          0.231 m  LVOT/AV VTI ratio: 0.29  AI PHT:            346 msec    AORTA  Ao Root diam: 3.60 cm  Ao Asc diam:  3.70 cm   MITRAL VALVE                TRICUSPID VALVE  MV Area (PHT):              TR Peak grad:   19.9 mmHg  MV Decel Time:              TR Vmax:        223.00 cm/s  MV E velocity: 79.10 cm/s   Estimated RAP:  3.00 mmHg  MV A velocity: 105.00 cm/s  RVSP:  22.9 mmHg  MV E/A ratio:  0.75                              SHUNTS                              Systemic VTI:  0.23 m                              Systemic Diam: 2.20 cm    CATH (08/2022) Conclusion       Mid RCA lesion is 20% stenosed.   1st Mrg lesion is 20% stenosed.   Ost LM to Mid LM lesion is 20% stenosed.   Ost LAD to Prox LAD lesion is 30% stenosed.   Mid LAD lesion is 40% stenosed.   2nd Diag lesion is 40% stenosed.   Mild non-obstructive CAD Normal right heart pressures Severe aortic stenosis by echo    Recent Radiology Findings:   TAVR CTA (08/2022) FINDINGS: Aortic Valve:   Tricuspid aortic valve with severely reduced cusp excursion. Severely thickened and severely calcified aortic valve cusps.   AV calcium score: 3105   Virtual Basal Annulus Measurements:   Maximum/Minimum Diameter: 30.1 x 26.7 mm   Perimeter: 88.9 mm   Area:  615 mm2   Mild LVOT calcifications below LCC.   Membranous septal length: 5.5 mm   Based on these measurements, the annulus would be suitable for a 29 mm Sapien 3 valve. Alternatively, Heart Team  can consider 34 mm Evolut valve. Recommend Heart Team discussion for valve selection.   Sinus of Valsalva Measurements:   Non-coronary:  37 mm   Right - coronary:  35 mm   Left - coronary:  36 mm   Sinus of Valsalva Height:   Left: 29 mm   Right: 25.6 mm   Aorta: Conventional 3 vessel branch pattern of aortic arch.   Sinotubular Junction:  31 mm   Ascending Thoracic Aorta: 34.5 mm   Aortic Arch:  26 mm   Descending Thoracic Aorta:  25 mm   Coronary Artery Height above Annulus:   Left main: 22.8 mm   Right coronary: 20.5 mm   Coronary Arteries: Normal coronary origin. Right dominance. The study was performed without use of NTG and insufficient for plaque evaluation. Coronary artery calcifications.   Optimum Fluoroscopic Angle for Delivery: LAO 1 CAU 1   OTHER:   Left atrial appendage: No thrombus.   Mitral valve: Grossly normal, mild mitral annular calcifications.   Pulmonary artery: Normal caliber.   Pulmonary veins: Normal anatomy.   IMPRESSION: 1. Tricuspid aortic valve with severely reduced cusp excursion. Severely thickened and severely calcified aortic valve cusps. 2. Aortic valve calcium score: 3105 3. Annulus area: 615 mm2, suitable for 29 mm Sapien 3 valve. Mild LVOT calcifications. Membranous septal length 5.5 mm. 4. Sufficient coronary artery heights from annulus. 5. Optimum Fluoroscopic Angle for Delivery: LAO 1 CAU 1       Recent Lab Findings: Recent Labs       Lab Results  Component Value Date    WBC 9.1 08/31/2022    HGB 12.2 (L) 09/06/2022    HCT 36.0 (L) 09/06/2022    PLT 186 08/31/2022    GLUCOSE 105 (H) 08/31/2022    CHOL 148 01/18/2020    TRIG 138 01/18/2020  HDL 62 01/18/2020    LDLCALC 62 01/18/2020    ALT 16 01/18/2020    AST 21 01/18/2020    NA 141 09/06/2022    K 3.7 09/06/2022    CL 99 08/31/2022    CREATININE 0.71 (L) 08/31/2022    BUN 18 08/31/2022    CO2 25 08/31/2022    TSH 1.250 01/18/2020    INR 1.1  10/20/2019            Assessment / Plan:     Pt with NYHA class 2 symptoms of severe paradoxical low gradient AS and with preserved LV function and no CAD. Pt without significant underlying conduction issues. Pt is a candidate for AVR and with his age alone would favor TAVR and he understands the risks, goals, and recovery and wishes to proceed. Pt would benefit from a 29 sapien valve via the Left femoral access side and with his overall good health would be considered a bail out candidate depending on circumstances.

## 2022-10-02 ENCOUNTER — Inpatient Hospital Stay (HOSPITAL_COMMUNITY): Payer: Medicare Other | Admitting: Physician Assistant

## 2022-10-02 ENCOUNTER — Other Ambulatory Visit: Payer: Self-pay | Admitting: Physician Assistant

## 2022-10-02 ENCOUNTER — Encounter (HOSPITAL_COMMUNITY): Payer: Self-pay | Admitting: Cardiovascular Disease

## 2022-10-02 ENCOUNTER — Inpatient Hospital Stay (HOSPITAL_COMMUNITY)
Admission: RE | Admit: 2022-10-02 | Discharge: 2022-10-03 | DRG: 267 | Disposition: A | Discharge status: Home / Self Care | Payer: Medicare Other | Attending: Cardiovascular Disease | Admitting: Cardiovascular Disease

## 2022-10-02 ENCOUNTER — Inpatient Hospital Stay (HOSPITAL_COMMUNITY): Payer: Medicare Other | Admitting: Anesthesiology

## 2022-10-02 ENCOUNTER — Encounter (HOSPITAL_COMMUNITY)
Admission: RE | Disposition: A | Discharge status: Home / Self Care | Payer: Self-pay | Source: Home / Self Care | Attending: Cardiovascular Disease

## 2022-10-02 ENCOUNTER — Inpatient Hospital Stay (HOSPITAL_COMMUNITY): Payer: Medicare Other

## 2022-10-02 DIAGNOSIS — D696 Thrombocytopenia, unspecified: Secondary | ICD-10-CM | POA: Diagnosis present

## 2022-10-02 DIAGNOSIS — Z887 Allergy status to serum and vaccine status: Secondary | ICD-10-CM

## 2022-10-02 DIAGNOSIS — I480 Paroxysmal atrial fibrillation: Secondary | ICD-10-CM | POA: Insufficient documentation

## 2022-10-02 DIAGNOSIS — J849 Interstitial pulmonary disease, unspecified: Secondary | ICD-10-CM | POA: Diagnosis present

## 2022-10-02 DIAGNOSIS — Z87891 Personal history of nicotine dependence: Secondary | ICD-10-CM

## 2022-10-02 DIAGNOSIS — Z79899 Other long term (current) drug therapy: Secondary | ICD-10-CM

## 2022-10-02 DIAGNOSIS — E785 Hyperlipidemia, unspecified: Secondary | ICD-10-CM | POA: Diagnosis present

## 2022-10-02 DIAGNOSIS — I350 Nonrheumatic aortic (valve) stenosis: Secondary | ICD-10-CM | POA: Diagnosis present

## 2022-10-02 DIAGNOSIS — I119 Hypertensive heart disease without heart failure: Secondary | ICD-10-CM | POA: Diagnosis not present

## 2022-10-02 DIAGNOSIS — Z8249 Family history of ischemic heart disease and other diseases of the circulatory system: Secondary | ICD-10-CM | POA: Diagnosis not present

## 2022-10-02 DIAGNOSIS — Z9049 Acquired absence of other specified parts of digestive tract: Secondary | ICD-10-CM

## 2022-10-02 DIAGNOSIS — Z7982 Long term (current) use of aspirin: Secondary | ICD-10-CM | POA: Diagnosis not present

## 2022-10-02 DIAGNOSIS — R008 Other abnormalities of heart beat: Secondary | ICD-10-CM | POA: Diagnosis present

## 2022-10-02 DIAGNOSIS — I2510 Atherosclerotic heart disease of native coronary artery without angina pectoris: Secondary | ICD-10-CM

## 2022-10-02 DIAGNOSIS — Z952 Presence of prosthetic heart valve: Secondary | ICD-10-CM | POA: Diagnosis not present

## 2022-10-02 DIAGNOSIS — I252 Old myocardial infarction: Secondary | ICD-10-CM

## 2022-10-02 DIAGNOSIS — I493 Ventricular premature depolarization: Secondary | ICD-10-CM | POA: Insufficient documentation

## 2022-10-02 DIAGNOSIS — Z006 Encounter for examination for normal comparison and control in clinical research program: Secondary | ICD-10-CM

## 2022-10-02 HISTORY — DX: Nonrheumatic aortic (valve) stenosis: I35.0

## 2022-10-02 HISTORY — DX: Presence of prosthetic heart valve: Z95.2

## 2022-10-02 HISTORY — DX: Paroxysmal atrial fibrillation: I48.0

## 2022-10-02 HISTORY — PX: TRANSCATHETER AORTIC VALVE REPLACEMENT, TRANSFEMORAL: SHX6400

## 2022-10-02 HISTORY — PX: INTRAOPERATIVE TRANSTHORACIC ECHOCARDIOGRAM: SHX6523

## 2022-10-02 LAB — POCT I-STAT, CHEM 8
BUN: 17 mg/dL (ref 8–23)
Calcium, Ion: 1.25 mmol/L (ref 1.15–1.40)
Chloride: 101 mmol/L (ref 98–111)
Creatinine, Ser: 0.6 mg/dL — ABNORMAL LOW (ref 0.61–1.24)
Glucose, Bld: 122 mg/dL — ABNORMAL HIGH (ref 70–99)
HCT: 39 % (ref 39.0–52.0)
Hemoglobin: 13.3 g/dL (ref 13.0–17.0)
Potassium: 4.2 mmol/L (ref 3.5–5.1)
Sodium: 138 mmol/L (ref 135–145)
TCO2: 28 mmol/L (ref 22–32)

## 2022-10-02 LAB — ECHOCARDIOGRAM LIMITED
AR max vel: 2.98 cm2
AV Area VTI: 3.57 cm2
AV Area mean vel: 2.57 cm2
AV Mean grad: 3 mmHg
AV Peak grad: 5 mmHg
Ao pk vel: 1.12 m/s

## 2022-10-02 LAB — ABO/RH: ABO/RH(D): A POS

## 2022-10-02 SURGERY — IMPLANTATION, AORTIC VALVE, TRANSCATHETER, FEMORAL APPROACH
Anesthesia: Monitor Anesthesia Care

## 2022-10-02 MED ORDER — ONDANSETRON HCL 4 MG/2ML IJ SOLN
INTRAMUSCULAR | Status: DC | PRN
  Administered 2022-10-02: 4 mg via INTRAVENOUS

## 2022-10-02 MED ORDER — SODIUM CHLORIDE 0.9 % IV SOLN: INTRAVENOUS | Status: DC

## 2022-10-02 MED ORDER — MORPHINE SULFATE (PF) 2 MG/ML IV SOLN: 1.0000 mg | INTRAVENOUS | Status: DC | PRN

## 2022-10-02 MED ORDER — CEFAZOLIN SODIUM-DEXTROSE 2-4 GM/100ML-% IV SOLN
2.0000 g | Freq: Three times a day (TID) | INTRAVENOUS | Status: AC
  Administered 2022-10-02 (×2): 2 g via INTRAVENOUS
  Filled 2022-10-02 (×2): qty 100

## 2022-10-02 MED ORDER — IOHEXOL 350 MG/ML SOLN
INTRAVENOUS | Status: DC | PRN
  Administered 2022-10-02: 60 mL

## 2022-10-02 MED ORDER — ACETAMINOPHEN 500 MG PO TABS
1000.0000 mg | ORAL_TABLET | Freq: Once | ORAL | Status: AC
  Administered 2022-10-02: 1000 mg via ORAL
  Filled 2022-10-02: qty 2

## 2022-10-02 MED ORDER — HEPARIN (PORCINE) IN NACL 1000-0.9 UT/500ML-% IV SOLN
INTRAVENOUS | Status: DC | PRN
  Administered 2022-10-02 (×3): 500 mL

## 2022-10-02 MED ORDER — ACETAMINOPHEN 650 MG RE SUPP: 650.0000 mg | Freq: Four times a day (QID) | RECTAL | Status: DC | PRN

## 2022-10-02 MED ORDER — SODIUM CHLORIDE 0.9% FLUSH
3.0000 mL | Freq: Two times a day (BID) | INTRAVENOUS | Status: DC
  Administered 2022-10-03: 3 mL via INTRAVENOUS

## 2022-10-02 MED ORDER — CHLORHEXIDINE GLUCONATE 4 % EX SOLN: 30.0000 mL | CUTANEOUS | Status: DC

## 2022-10-02 MED ORDER — ACETAMINOPHEN 325 MG PO TABS
650.0000 mg | ORAL_TABLET | Freq: Four times a day (QID) | ORAL | Status: DC | PRN
  Administered 2022-10-02 – 2022-10-03 (×2): 650 mg via ORAL
  Filled 2022-10-02 (×2): qty 2

## 2022-10-02 MED ORDER — METOPROLOL SUCCINATE ER 25 MG PO TB24
25.0000 mg | ORAL_TABLET | Freq: Every day | ORAL | Status: DC
  Administered 2022-10-03: 25 mg via ORAL
  Filled 2022-10-02 (×2): qty 1

## 2022-10-02 MED ORDER — LIDOCAINE HCL (PF) 1 % IJ SOLN
INTRAMUSCULAR | Status: DC | PRN
  Administered 2022-10-02 (×2): 5 mL

## 2022-10-02 MED ORDER — LACTATED RINGERS IV SOLN: INTRAVENOUS | Status: DC

## 2022-10-02 MED ORDER — PHENYLEPHRINE 80 MCG/ML (10ML) SYRINGE FOR IV PUSH (FOR BLOOD PRESSURE SUPPORT)
PREFILLED_SYRINGE | INTRAVENOUS | Status: DC | PRN
  Administered 2022-10-02: 240 ug via INTRAVENOUS

## 2022-10-02 MED ORDER — NITROGLYCERIN IN D5W 200-5 MCG/ML-% IV SOLN: 0.0000 ug/min | INTRAVENOUS | Status: DC

## 2022-10-02 MED ORDER — LIDOCAINE HCL (PF) 1 % IJ SOLN
INTRAMUSCULAR | Status: AC
  Filled 2022-10-02: qty 30

## 2022-10-02 MED ORDER — ROSUVASTATIN CALCIUM 20 MG PO TABS
40.0000 mg | ORAL_TABLET | Freq: Every day | ORAL | Status: DC
  Administered 2022-10-02 – 2022-10-03 (×2): 40 mg via ORAL
  Filled 2022-10-02 (×2): qty 2

## 2022-10-02 MED ORDER — OXYCODONE HCL 5 MG PO TABS: 5.0000 mg | ORAL_TABLET | ORAL | Status: DC | PRN

## 2022-10-02 MED ORDER — CHLORHEXIDINE GLUCONATE 4 % EX SOLN: 60.0000 mL | Freq: Once | CUTANEOUS | Status: DC

## 2022-10-02 MED ORDER — SODIUM CHLORIDE 0.9 % IV SOLN: 250.0000 mL | INTRAVENOUS | Status: DC | PRN

## 2022-10-02 MED ORDER — ASPIRIN 81 MG PO CHEW
81.0000 mg | CHEWABLE_TABLET | Freq: Every day | ORAL | Status: DC
  Administered 2022-10-03: 81 mg via ORAL
  Filled 2022-10-02: qty 1

## 2022-10-02 MED ORDER — TRAMADOL HCL 50 MG PO TABS: 50.0000 mg | ORAL_TABLET | ORAL | Status: DC | PRN

## 2022-10-02 MED ORDER — PROTAMINE SULFATE 10 MG/ML IV SOLN
INTRAVENOUS | Status: DC | PRN
  Administered 2022-10-02: 100 mg via INTRAVENOUS

## 2022-10-02 MED ORDER — CHLORHEXIDINE GLUCONATE 0.12 % MT SOLN
15.0000 mL | Freq: Once | OROMUCOSAL | Status: AC
  Administered 2022-10-02: 15 mL via OROMUCOSAL
  Filled 2022-10-02: qty 15

## 2022-10-02 MED ORDER — HEPARIN SODIUM (PORCINE) 1000 UNIT/ML IJ SOLN
INTRAMUSCULAR | Status: DC | PRN
  Administered 2022-10-02: 10000 [IU] via INTRAVENOUS

## 2022-10-02 MED ORDER — SODIUM CHLORIDE 0.9 % IV SOLN: INTRAVENOUS | Status: AC

## 2022-10-02 MED ORDER — ONDANSETRON HCL 4 MG/2ML IJ SOLN
4.0000 mg | Freq: Four times a day (QID) | INTRAMUSCULAR | Status: DC | PRN
  Administered 2022-10-03: 4 mg via INTRAVENOUS
  Filled 2022-10-02: qty 2

## 2022-10-02 MED ORDER — SODIUM CHLORIDE 0.9% FLUSH: 3.0000 mL | INTRAVENOUS | Status: DC | PRN

## 2022-10-02 MED ORDER — FENTANYL CITRATE (PF) 250 MCG/5ML IJ SOLN
INTRAMUSCULAR | Status: DC | PRN
  Administered 2022-10-02: 25 ug via INTRAVENOUS

## 2022-10-02 MED ORDER — FENTANYL CITRATE (PF) 100 MCG/2ML IJ SOLN
INTRAMUSCULAR | Status: AC
  Filled 2022-10-02: qty 2

## 2022-10-02 SURGICAL SUPPLY — 33 items
BAG SNAP BAND KOVER 36X36 (MISCELLANEOUS) ×4 IMPLANT
CABLE ADAPT PACING TEMP 12FT (ADAPTER) IMPLANT
CATH COMMANDER DELIVERY SYS 29 (CATHETERS) IMPLANT
CATH DIAG 6FR PIGTAIL ANGLED (CATHETERS) IMPLANT
CATH INFINITI 5FR ANG PIGTAIL (CATHETERS) IMPLANT
CATH INFINITI 6F AL2 (CATHETERS) IMPLANT
CATH S G BIP PACING (CATHETERS) IMPLANT
CLOSURE MYNX CONTROL 6F/7F (Vascular Products) IMPLANT
CLOSURE PERCLOSE PROSTYLE (VASCULAR PRODUCTS) IMPLANT
CRIMPER (MISCELLANEOUS) IMPLANT
DEVICE INFLATION ATRION QL38 (MISCELLANEOUS) IMPLANT
ELECT DEFIB PAD ADLT CADENCE (PAD) IMPLANT
KIT HEART LEFT (KITS) ×2 IMPLANT
KIT MICROPUNCTURE NIT STIFF (SHEATH) IMPLANT
KIT SAPIAN 3 ULTRA RESILIA 29 (Valve) IMPLANT
PACK CARDIAC CATHETERIZATION (CUSTOM PROCEDURE TRAY) ×2 IMPLANT
SHEATH BRITE TIP 7FR 35CM (SHEATH) IMPLANT
SHEATH INTRODUCER SET 29 (SHEATH) IMPLANT
SHEATH PINNACLE 6F 10CM (SHEATH) IMPLANT
SHEATH PINNACLE 8F 10CM (SHEATH) IMPLANT
SHEATH PROBE COVER 6X72 (BAG) IMPLANT
SLEEVE REPOSITIONING LENGTH 30 (MISCELLANEOUS) IMPLANT
STOPCOCK MORSE 400PSI 3WAY (MISCELLANEOUS) ×4 IMPLANT
SYR MEDRAD MARK 7 150ML (SYRINGE) IMPLANT
TRANSDUCER W/STOPCOCK (MISCELLANEOUS) ×4 IMPLANT
TUBING ART PRESS 72  MALE/FEM (TUBING) ×4
TUBING ART PRESS 72 MALE/FEM (TUBING) IMPLANT
TUBING CONTRAST HIGH PRESS 48 (TUBING) IMPLANT
WIRE AMPLATZ SS-J .035X180CM (WIRE) IMPLANT
WIRE EMERALD 3MM-J .035X150CM (WIRE) IMPLANT
WIRE EMERALD 3MM-J .035X260CM (WIRE) IMPLANT
WIRE EMERALD ST .035X260CM (WIRE) IMPLANT
WIRE SAFARI SM CURVE 275 (WIRE) IMPLANT

## 2022-10-02 NOTE — Op Note (Signed)
[facility]   MULTIDISCIPLINARY HEART VALVE TEAM   TAVR OPERATIVE NOTE   Date of Procedure:  10/02/2022  Preoperative Diagnosis: Severe Aortic Stenosis   Postoperative Diagnosis: Same   Procedure:   Transcatheter Aortic Valve Replacement - Percutaneous left Transfemoral Approach  Edwards Sapien 3 Ultra THV (size 29 mm, model # 9755RSL, serial # [id number])   Co-Surgeons:  Eugenio Hoes MD and  Verne Carrow   Anesthesiologist:  Dr Sampson Goon  Echocardiographer:  Dr Eden Emms  Pre-operative Echo Findings: Severe aortic stenosis normal left ventricular systolic function  Post-operative Echo Findings: no paravalvular leak normal left ventricular systolic function   BRIEF CLINICAL NOTE AND INDICATIONS FOR SURGERY  Pt with NYHA class 2 symptoms of severe paradoxical low gradient AS and with preserved LV function and no CAD. Pt without significant underlying conduction issues. Pt is a candidate for AVR and with his age alone would favor TAVR and he understands the risks, goals, and recovery and wishes to proceed. Pt would benefit from a 29 sapien valve via the Left femoral access side and with his overall good health would be considered a bail out candidate depending on circumstances.     DETAILS OF THE OPERATIVE PROCEDURE  PREPARATION:    The patient was brought to the operating room on the above mentioned date and appropriate monitoring was established by the anesthesia team. The patient was placed in the supine position on the operating table.  Intravenous antibiotics were administered. The patient was monitored closely throughout the procedure under conscious sedation.  Baseline transthoracic echocardiogram was performed. The patient's abdomen and both groins were prepped and draped in a sterile manner. A time out procedure was performed.   PERIPHERAL ACCESS:    Using the modified Seldinger technique, femoral arterial and venous access was obtained with  placement of 6 Fr sheaths on the right side.  A pigtail diagnostic catheter was passed through the right arterial sheath under fluoroscopic guidance into the aortic root.  A temporary transvenous pacemaker catheter was passed through the right femoral venous sheath under fluoroscopic guidance into the right ventricle.  The pacemaker was tested to ensure stable lead placement and pacemaker capture. Aortic root angiography was performed in order to determine the optimal angiographic angle for valve deployment.   TRANSFEMORAL ACCESS:   Percutaneous transfemoral access and sheath placement was performed using ultrasound guidance.  The left common femoral artery was cannulated using a micropuncture needle and appropriate location was verified using hand injection angiogram.  A pair of Abbott Perclose percutaneous closure devices were placed and a 6 French sheath replaced into the femoral artery.  The patient was heparinized systemically and ACT verified > 250 seconds.    A 16 Fr transfemoral E-sheath was introduced into the left common femoral artery after progressively dilating over an Amplatz superstiff wire. An AL2 catheter was used to direct a straight-tip exchange length wire across the native aortic valve into the left ventricle. This was exchanged out for a pigtail catheter and position was confirmed in the LV apex. Simultaneous LV and Ao pressures were recorded with an LVEDP of The pigtail catheter was exchanged for a Safari wire in the LV apex.   BALLOON AORTIC VALVULOPLASTY:   Balloon aortic valvuloplasty wasnot performed TRANSCATHETER HEART VALVE DEPLOYMENT:   An Edwards Sapien 3 Ultra transcatheter heart valve (size 29 mm) was prepared and crimped per manufacturer's guidelines, and the proper orientation of the valve is confirmed on the [employer] delivery system. The valve was  advanced through the introducer sheath using normal technique until in an appropriate position in the  abdominal aorta beyond the sheath tip. The balloon was then retracted and using the fine-tuning wheel was centered on the valve. The valve was then advanced across the aortic arch using appropriate flexion of the catheter. The valve was carefully positioned across the aortic valve annulus. The Commander catheter was retracted using normal technique. Once final position of the valve has been confirmed by angiographic assessment, the valve is deployed during rapid ventricular pacing to maintain systolic blood pressure < 50 mmHg and pulse pressure < 10 mmHg. The balloon inflation is held for >3 seconds after reaching full deployment volume. Once the balloon has fully deflated the balloon is retracted into the ascending aorta and valve function is assessed using echocardiography. There is felt to be no paravalvular leak and no central aortic insufficiency.  The patient's hemodynamic recovery following valve deployment is good.  The deployment balloon and guidewire are both removed.    PROCEDURE COMPLETION:   The sheath was removed and femoral artery closure performed.  Protamine was administered once femoral arterial repair was complete. The temporary pacemaker, pigtail catheter and femoral sheaths were removed with manual pressure used for venous hemostasis.  A Mynx femoral closure device was utilized following removal of the diagnostic sheath in the right femoral artery.  The patient tolerated the procedure well and is transported to the cath lab recovery area in stable condition. There were no immediate intraoperative complications. All sponge instrument and needle counts are verified correct at completion of the operation.   No blood products were administered during the operation.  The patient received a total of 60 mL of intravenous contrast during the procedure.   Eugenio Hoes, MD 10/02/2022 10:47 AM

## 2022-10-02 NOTE — Discharge Summary (Incomplete)
[facility]   MULTIDISCIPLINARY HEART VALVE TEAM  Discharge Summary    Patient ID: [name] [name] MRN: [id number]; DOB: [birth year]  Admit date: 10/02/2022 Discharge date: 10/03/2022  Primary Care Provider: Gracelyn Nurse, MD  Primary Cardiologist: Dietrich Pates, MD / Dr. Clifton James & Dr. Leafy Ro (TAVR)  Discharge Diagnoses    Principal Problem:   S/P TAVR (transcatheter aortic valve replacement) Active Problems:   Hyperlipidemia   CAD (coronary artery disease)   PVC's (premature ventricular contractions)   PAF (paroxysmal atrial fibrillation) (HCC)   Severe aortic stenosis   Allergies Allergies  Allergen Reactions   Tetanus Toxoid, Adsorbed     Tetanus shot - altered mental state   Tetanus Toxoids     Tetanus shot - altered mental state    Diagnostic Studies/Procedures    [facility]  TAVR OPERATIVE NOTE     Date of Procedure:                10/02/2022   Preoperative Diagnosis:      Severe Aortic Stenosis    Postoperative Diagnosis:    Same    Procedure:        Transcatheter Aortic Valve Replacement - Transfemoral Approach             Edwards Sapien 3 THV (size 29 mm, model # [id number], serial #  [id number] )              Co-Surgeons:                        Verne Carrow, MD and Eugenio Hoes , MD    Anesthesiologist:                  Sampson Goon   Echocardiographer:              Eden Emms   Pre-operative Echo Findings: Severe aortic stenosis Normal left ventricular systolic function   Post-operative Echo Findings: No paravalvular leak Normal left ventricular systolic function _____________    Echo 10/03/22: completed but pending formal read at the time of discharge   History of Present Illness     [name] [name] is a 89 y.o. male with a history of CAD (NSTEMI in 2007 w/occlusion of OM branch; flow was restored with passage of wire), post op PAF with no recurrence (not on OAC), HLD, PVCs and severe LFLG aortic stenosis who  presented to [facility] on 10/02/22 for planned TAVR.   He had a NSTEMI in 2007 and had occlusion of the obtuse marginal branch. Flow was restored with passage of the wire and no angioplasty was performed. Mild disease in the LAD at that time. Nuclear stress test in 2021 with no ischemia. He developed post-operative atrial fibrillation following a bowel resection in 2021 and was on Eliquis for a short time but this was stopped in 2022 after a monitor did not demonstrate recurrence of his atrial fib. He has been followed for moderate aortic stenosis. He recently reported progressive dyspnea on exertion and fatigue. Echo 08/16/22 showed EF 50%, and severe paradoxical LFLG AS with a mean grad 24 mmHg, peak grad 37.9 mmHg, AVA 1.12 cm2, DVI 0.29, SVI 39. R/LHC 09/06/22 showed mild non-obstructive CAD. Normal right heart pressures.   The patient was evaluated by the multidisciplinary valve team and felt to have severe, symptomatic aortic stenosis and to be a suitable candidate for TAVR, which was set up for 10/02/22.   Hospital Course  Consultants: none   Severe LFLG AS: s/p successful TAVR with a 29 mm Edwards Sapien 3 Ultra Resilia THV via the TF approach on 10/02/22. Post operative echo completed but pending formal read. Groin sites are stable. ECG with sinus and no high grade heart block. Started on a baby Asprin 81mg  daily. Plan for discharge home today with close follow up in the outpatient setting.   CAD: recent L/RHC showed mild non obstructive CAD. Continue aspirin, statin and BB.  PAF: post op after a bowel resection with no recurrence. Not on OAC.  PVCs: continue Toprol XL 25mg  daily.   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 Dedicated ILD protocol CT could be performed for more complete evaluation. This will be discussed as an outpatient.   Elevated Raise score: given patients age, low voltage on ECG and severe LFLG AS, he was screened for  amyloid with a myeloma panel, urine immunofixation and kappa/lambda chains which are in process.   Thrombocytopenia: mild decrease in platelets to 116. Likely reactive.  _____________  Discharge Vitals Blood pressure 120/64, pulse 83, temperature 98.8 F (37.1 C), temperature source Oral, resp. rate 16, height 5\' 7"  (1.702 m), weight 71.5 kg, SpO2 96 %.  Filed Weights   10/02/22 0657 10/03/22 0300  Weight: 73.5 kg 71.5 kg     GEN: Well nourished, well developed, in no acute distress HEENT: normal Neck: no JVD or masses Cardiac: RRR; no murmurs, rubs, or gallops,no edema  Respiratory:  clear to auscultation bilaterally, normal work of breathing GI: soft, nontender, nondistended, + BS MS: no deformity or atrophy Skin: warm and dry, no rash.  Groin sites clear without hematoma or ecchymosis  Neuro:  Alert and Oriented x 3, Strength and sensation are intact Psych: euthymic mood, full affect   Labs & Radiologic Studies    CBC Recent Labs    10/02/22 1125 10/03/22 0159  WBC  --  10.3  HGB 13.3 13.0  HCT 39.0 38.9*  MCV  --  96.3  PLT  --  116*   Basic Metabolic Panel Recent Labs    40/98/11 1125 10/03/22 0159  NA 138 136  K 4.2 3.9  CL 101 101  CO2  --  27  GLUCOSE 122* 113*  BUN 17 12  CREATININE 0.60* 0.70  CALCIUM  --  8.6*  MG  --  1.8   Liver Function Tests No results for input(s): "AST", "ALT", "ALKPHOS", "BILITOT", "PROT", "ALBUMIN" in the last 72 hours. No results for input(s): "LIPASE", "AMYLASE" in the last 72 hours. Cardiac Enzymes No results for input(s): "CKTOTAL", "CKMB", "CKMBINDEX", "TROPONINI" in the last 72 hours. BNP Invalid input(s): "POCBNP" D-Dimer No results for input(s): "DDIMER" in the last 72 hours. Hemoglobin A1C No results for input(s): "HGBA1C" in the last 72 hours. Fasting Lipid Panel No results for input(s): "CHOL", "HDL", "LDLCALC", "TRIG", "CHOLHDL", "LDLDIRECT" in the last 72 hours. Thyroid Function Tests No results for  input(s): "TSH", "T4TOTAL", "T3FREE", "THYROIDAB" in the last 72 hours.  Invalid input(s): "FREET3" _____________  Structural Heart Procedure  Result Date: 10/02/2022 See surgical note for result.  ECHOCARDIOGRAM LIMITED  Result Date: 10/02/2022    ECHOCARDIOGRAM LIMITED REPORT   Patient Name:   ANTHONNY BURKLEY Date of Exam: 10/02/2022 Medical Rec #:  [id number]       Height:       67.0 in Accession #:    [id number]      Weight:  162.0 lb Date of Birth:  Nov 22, 1933       BSA:          1.849 m Patient Age:    88 years        BP:           133/70 mmHg Patient Gender: M               HR:           82 bpm. Exam Location:  Inpatient Procedure: Limited Echo, Color Doppler and Cardiac Doppler Indications:     Aortic Stenosis i35.0  History:         Patient has prior history of Echocardiogram examinations, most                  recent 08/16/2022. CAD, Arrythmias:Atrial Fibrillation; Risk                  Factors:Dyslipidemia.  Sonographer:     Irving Burton Senior RDCS Referring Phys:  [id number] Kathleene Hazel Diagnosing Phys: Charlton Haws MD  Sonographer Comments: 29mm Edwards [phone] TAVR Implanted IMPRESSIONS  1. Left ventricular ejection fraction, by estimation, is 50 to 55%. The left ventricle has low normal function. There is mild left ventricular hypertrophy.  2. Right ventricular systolic function is mildly reduced. The right ventricular size is mildly enlarged.  3. Right atrial size was mildly dilated.  4. The mitral valve is abnormal. Trivial mitral valve regurgitation. Moderate mitral annular calcification.  5. Pre TAVR: severely calcified tri leaflet valve mean gradient 24 peak 36 mmHg AVA 0.9 cm2 DVI 0.24 conistant with severe AS         Post TAVR: well placed 29 mm Sapien 3 valve with no PVL mean gradient 3 peak 5 mmHg AVA 2.6 cm2. The aortic valve is tricuspid. There is severe calcifcation of the aortic valve. There is severe thickening of the aortic valve. FINDINGS  Left Ventricle: Left ventricular  ejection fraction, by estimation, is 50 to 55%. The left ventricle has low normal function. The left ventricular internal cavity size was normal in size. There is mild left ventricular hypertrophy. Right Ventricle: The right ventricular size is mildly enlarged. Right ventricular systolic function is mildly reduced. Left Atrium: Left atrial size was normal in size. Right Atrium: Right atrial size was mildly dilated. Pericardium: There is no evidence of pericardial effusion. Mitral Valve: The mitral valve is abnormal. There is moderate thickening of the mitral valve leaflet(s). There is moderate calcification of the mitral valve leaflet(s). Moderate mitral annular calcification. Trivial mitral valve regurgitation. Tricuspid Valve: Tricuspid valve regurgitation is trivial. Aortic Valve: Pre TAVR: severely calcified tri leaflet valve mean gradient 24 peak 36 mmHg AVA 0.9 cm2 DVI 0.24 conistant with severe AS Post TAVR: well placed 29 mm Sapien 3 valve with no PVL mean gradient 3 peak 5 mmHg AVA 2.6 cm2. The aortic valve is tricuspid. There is severe calcifcation of the aortic valve. There is severe thickening of the aortic valve. Aortic valve mean gradient measures 3.0 mmHg. Aortic valve peak gradient measures 5.0 mmHg. Aortic valve area, by VTI measures 3.57 cm. Aorta: The aortic root was not well visualized. Additional Comments: Spectral Doppler performed. Color Doppler performed.  LEFT VENTRICLE PLAX 2D LVOT diam:     2.20 cm LV SV:         81 LV SV Index:   44 LVOT Area:     3.80 cm  AORTIC VALVE AV Area (Vmax):    2.98  cm AV Area (Vmean):   2.57 cm AV Area (VTI):     3.57 cm AV Vmax:           112.00 cm/s AV Vmean:          78.100 cm/s AV VTI:            0.227 m AV Peak Grad:      5.0 mmHg AV Mean Grad:      3.0 mmHg LVOT Vmax:         87.80 cm/s LVOT Vmean:        52.900 cm/s LVOT VTI:          0.213 m LVOT/AV VTI ratio: 0.94  SHUNTS Systemic VTI:  0.21 m Systemic Diam: 2.20 cm Charlton Haws MD Electronically  signed by Charlton Haws MD Signature Date/Time: 10/02/2022/10:32:02 AM    Final    DG Chest 2 View  Result Date: 10/01/2022 CLINICAL DATA:  Severe aortic stenosis. Preop aortic valve replacement. EXAM: CHEST - 2 VIEW COMPARISON:  Radiograph 10/20/2019. CT 09/12/2022 FINDINGS: The heart is normal in size. Sequela of interstitial lung disease with peripheral predominant subpleural reticulation. No evidence of acute or focal airspace disease. No pleural effusion or pneumothorax. Postsurgical change of the gastroesophageal junction. No acute osseous findings. IMPRESSION: Interstitial lung disease. No acute abnormality. Electronically Signed   By: Narda Rutherford M.D.   On: 10/01/2022 09:28   CT CORONARY MORPH W/CTA COR W/SCORE W/CA W/CM &/OR WO/CM  Addendum Date: 09/13/2022   ADDENDUM REPORT: 09/13/2022 23:15 CLINICAL DATA:  Aortic valve replacement (TAVR), pre-op eval EXAM: Cardiac TAVR CT TECHNIQUE: The patient was scanned on a Siemens Force 192 slice scanner. A 120 kV retrospective scan was triggered in the descending thoracic aorta at 111 HU's. Gantry rotation speed was 270 msecs and collimation was .9 mm. The 3D data set was reconstructed in 5% intervals of the R-R cycle. Systolic and diastolic phases were analyzed on a dedicated work station using MPR, MIP and VRT modes. The patient received OMNIPAQUE IOHEXOL 350 MG/ML SOLN of contrast. FINDINGS: Aortic Valve: Tricuspid aortic valve with severely reduced cusp excursion. Severely thickened and severely calcified aortic valve cusps. AV calcium score: 3105 Virtual Basal Annulus Measurements: Maximum/Minimum Diameter: 30.1 x 26.7 mm Perimeter: 88.9 mm Area:  615 mm2 Mild LVOT calcifications below LCC. Membranous septal length: 5.5 mm Based on these measurements, the annulus would be suitable for a 29 mm Sapien 3 valve. Alternatively, Heart Team can consider 34 mm Evolut valve. Recommend Heart Team discussion for valve selection. Sinus of Valsalva  Measurements: Non-coronary:  37 mm Right - coronary:  35 mm Left - coronary:  36 mm Sinus of Valsalva Height: Left: 29 mm Right: 25.6 mm Aorta: Conventional 3 vessel branch pattern of aortic arch. Sinotubular Junction:  31 mm Ascending Thoracic Aorta: 34.5 mm Aortic Arch:  26 mm Descending Thoracic Aorta:  25 mm Coronary Artery Height above Annulus: Left main: 22.8 mm Right coronary: 20.5 mm Coronary Arteries: Normal coronary origin. Right dominance. The study was performed without use of NTG and insufficient for plaque evaluation. Coronary artery calcifications. Optimum Fluoroscopic Angle for Delivery: LAO 1 CAU 1 OTHER: Left atrial appendage: No thrombus. Mitral valve: Grossly normal, mild mitral annular calcifications. Pulmonary artery: Normal caliber. Pulmonary veins: Normal anatomy. IMPRESSION: 1. Tricuspid aortic valve with severely reduced cusp excursion. Severely thickened and severely calcified aortic valve cusps. 2. Aortic valve calcium score: 3105 3. Annulus area: 615 mm2, suitable for 29 mm Sapien 3  valve. Mild LVOT calcifications. Membranous septal length 5.5 mm. 4. Sufficient coronary artery heights from annulus. 5. Optimum Fluoroscopic Angle for Delivery: LAO 1 CAU 1 Electronically Signed   By: Weston Brass M.D.   On: 09/13/2022 23:15   Result Date: 09/13/2022 EXAM: OVER-READ INTERPRETATION  CT CHEST The following report is a limited chest CT over-read performed by radiologist Dr. Allegra Lai of [facility] on 09/12/2022. This over-read does not include interpretation of cardiac or coronary anatomy or pathology. The cardiac TAVR interpretation by the cardiologist is attached. COMPARISON:  None Available. FINDINGS: Extracardiac findings will be described separately under dictation for contemporaneously obtained CTA chest, abdomen and pelvis. IMPRESSION: Please see separate dictation for contemporaneously obtained CTA chest, abdomen and pelvis dated 09/13/2022 for full description  of relevant extracardiac findings. Electronically Signed: By: Allegra Lai M.D. On: 09/13/2022 08:43   CT ANGIO CHEST AORTA W/CM & OR WO/CM  Result Date: 09/13/2022 CLINICAL DATA:  Preop evaluation for aortic valve replacement EXAM: CT ANGIOGRAPHY CHEST, ABDOMEN AND PELVIS TECHNIQUE: Multidetector CT imaging through the chest, abdomen and pelvis was performed using the standard protocol during bolus administration of intravenous contrast. Multiplanar reconstructed images and MIPs were obtained and reviewed to evaluate the vascular anatomy. RADIATION DOSE REDUCTION: This exam was performed according to the departmental dose-optimization program which includes automated exposure control, adjustment of the mA and/or kV according to patient size and/or use of iterative reconstruction technique. CONTRAST:  OMNIPAQUE IOHEXOL 350 MG/ML SOLN COMPARISON:  None Available. FINDINGS: CTA CHEST FINDINGS Cardiovascular: Normal heart size. No pericardial effusion. Mitral annular calcifications. Aortic valve thickening and calcifications. Normal caliber thoracic aorta with moderate atherosclerotic disease. Standard three-vessel aortic arch with no significant stenosis. Coronary artery calcifications of the lad and RCA. Mediastinum/Nodes: Esophagus and thyroid are unremarkable. No enlarged lymph nodes seen in the chest. Lungs/Pleura: Central airways are patent. No consolidation, pleural effusion or pneumothorax. Subpleural predominant reticular and ground-glass opacities with associated traction bronchiectasis. Musculoskeletal: No chest wall abnormality. No acute or significant osseous findings. CTA ABDOMEN AND PELVIS FINDINGS Hepatobiliary: No focal liver abnormality is seen. No gallstones, gallbladder wall thickening, or biliary dilatation. Pancreas: Unremarkable. No pancreatic ductal dilatation or surrounding inflammatory changes. Spleen: Normal in size without focal abnormality. Adrenals/Urinary Tract: Bilateral  adrenal glands are unremarkable. No hydronephrosis or nephrolithiasis. Stomach/Bowel: Stomach is within normal limits. Severe sigmoid diverticulosis. No evidence of bowel wall thickening, distention, or inflammatory changes. Vascular/lymphatic: Normal caliber abdominal aorta with moderate atherosclerotic disease. 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 Otherwise, no significant stenosis. Small linear filling defect of the right common femoral artery seen on series 3 image 572, likely a focal dissection. No enlarged lymph nodes seen in the abdomen or pelvis. Reproductive: Mild prostatomegaly. Other: Postsurgical changes of prior ventral wall hernia repair. No abdominopelvic ascites. Musculoskeletal: Mild dextrocurvature of the lumbar spine. No acute or significant osseous findings. VASCULAR MEASUREMENTS PERTINENT TO TAVR: AORTA: Minimal Aortic Diameter-13.8 mm Severity of Aortic Calcification-moderate RIGHT PELVIS: Right Common Iliac Artery - Minimal Diameter-7.3 mm Tortuosity-moderate Calcification-moderate Right External Iliac Artery - Minimal Diameter-7.9 mm Tortuosity-moderate Calcification-mild Right Common Femoral Artery - Minimal Diameter-7.9 mm Tortuosity-none Calcification-moderate LEFT PELVIS: Left Common Iliac Artery - Minimal Diameter-8.5 mm Tortuosity-none Calcification-moderate Left External Iliac Artery - Minimal Diameter-7.6 mm Tortuosity-moderate Calcification-none Left Common Femoral Artery - Minimal Diameter-8.7 mm Tortuosity-none Calcification-moderate Review of the MIP images confirms the above findings. IMPRESSION: Vascular: 1. Vascular  findings and measurements pertinent to potential TAVR procedure, as detailed above. 2. Thickening and calcification of the aortic valve, compatible with reported clinical history of aortic stenosis. 3. Moderate aortoiliac atherosclerosis. LAD and RCA coronary artery  disease. 4. Small linear filling defect of the right common femoral artery, likely a focal dissection. Nonvascular: 1. findings consistent with fibrotic interstitial lung disease. Dedicated ILD protocol CT could be performed for more complete evaluation. Electronically Signed   By: Allegra Lai M.D.   On: 09/13/2022 08:42   CT ANGIO ABDOMEN PELVIS  W & WO CONTRAST  Result Date: 09/13/2022 CLINICAL DATA:  Preop evaluation for aortic valve replacement EXAM: CT ANGIOGRAPHY CHEST, ABDOMEN AND PELVIS TECHNIQUE: Multidetector CT imaging through the chest, abdomen and pelvis was performed using the standard protocol during bolus administration of intravenous contrast. Multiplanar reconstructed images and MIPs were obtained and reviewed to evaluate the vascular anatomy. RADIATION DOSE REDUCTION: This exam was performed according to the departmental dose-optimization program which includes automated exposure control, adjustment of the mA and/or kV according to patient size and/or use of iterative reconstruction technique. CONTRAST:  OMNIPAQUE IOHEXOL 350 MG/ML SOLN COMPARISON:  None Available. FINDINGS: CTA CHEST FINDINGS Cardiovascular: Normal heart size. No pericardial effusion. Mitral annular calcifications. Aortic valve thickening and calcifications. Normal caliber thoracic aorta with moderate atherosclerotic disease. Standard three-vessel aortic arch with no significant stenosis. Coronary artery calcifications of the lad and RCA. Mediastinum/Nodes: Esophagus and thyroid are unremarkable. No enlarged lymph nodes seen in the chest. Lungs/Pleura: Central airways are patent. No consolidation, pleural effusion or pneumothorax. Subpleural predominant reticular and ground-glass opacities with associated traction bronchiectasis. Musculoskeletal: No chest wall abnormality. No acute or significant osseous findings. CTA ABDOMEN AND PELVIS FINDINGS Hepatobiliary: No focal liver abnormality is seen. No gallstones,  gallbladder wall thickening, or biliary dilatation. Pancreas: Unremarkable. No pancreatic ductal dilatation or surrounding inflammatory changes. Spleen: Normal in size without focal abnormality. Adrenals/Urinary Tract: Bilateral adrenal glands are unremarkable. No hydronephrosis or nephrolithiasis. Stomach/Bowel: Stomach is within normal limits. Severe sigmoid diverticulosis. No evidence of bowel wall thickening, distention, or inflammatory changes. Vascular/lymphatic: Normal caliber abdominal aorta with moderate atherosclerotic disease. 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 Otherwise, no significant stenosis. Small linear filling defect of the right common femoral artery seen on series 3 image 572, likely a focal dissection. No enlarged lymph nodes seen in the abdomen or pelvis. Reproductive: Mild prostatomegaly. Other: Postsurgical changes of prior ventral wall hernia repair. No abdominopelvic ascites. Musculoskeletal: Mild dextrocurvature of the lumbar spine. No acute or significant osseous findings. VASCULAR MEASUREMENTS PERTINENT TO TAVR: AORTA: Minimal Aortic Diameter-13.8 mm Severity of Aortic Calcification-moderate RIGHT PELVIS: Right Common Iliac Artery - Minimal Diameter-7.3 mm Tortuosity-moderate Calcification-moderate Right External Iliac Artery - Minimal Diameter-7.9 mm Tortuosity-moderate Calcification-mild Right Common Femoral Artery - Minimal Diameter-7.9 mm Tortuosity-none Calcification-moderate LEFT PELVIS: Left Common Iliac Artery - Minimal Diameter-8.5 mm Tortuosity-none Calcification-moderate Left External Iliac Artery - Minimal Diameter-7.6 mm Tortuosity-moderate Calcification-none Left Common Femoral Artery - Minimal Diameter-8.7 mm Tortuosity-none Calcification-moderate Review of the MIP images confirms the above findings. IMPRESSION: Vascular: 1. Vascular findings and measurements pertinent  to potential TAVR procedure, as detailed above. 2. Thickening and calcification of the aortic valve, compatible with reported clinical history of aortic stenosis. 3. Moderate aortoiliac atherosclerosis. LAD and RCA coronary artery disease. 4. Small linear filling defect of the right common femoral artery, likely a focal dissection. Nonvascular: 1. findings consistent with fibrotic interstitial  lung disease. Dedicated ILD protocol CT could be performed for more complete evaluation. Electronically Signed   By: Allegra Lai M.D.   On: 09/13/2022 08:42   CARDIAC CATHETERIZATION  Result Date: 09/06/2022   Mid RCA lesion is 20% stenosed.   1st Mrg lesion is 20% stenosed.   Ost LM to Mid LM lesion is 20% stenosed.   Ost LAD to Prox LAD lesion is 30% stenosed.   Mid LAD lesion is 40% stenosed.   2nd Diag lesion is 40% stenosed. Mild non-obstructive CAD Normal right heart pressures Severe aortic stenosis by echo Recommendations: Will continue with workup for TAVR.   Disposition   Pt is being discharged home today in good condition.  Follow-up Plans & Appointments     Follow-up Information     Janetta Hora, PA-C. Go on 10/12/2022.   Specialties: Cardiology, Radiology Why: @ 9:25am, please arrive at least 10 minutes early. Contact information: [address] [place] [address] [phone]                  Discharge Medications   Allergies as of 10/03/2022       Reactions   Tetanus Toxoid, Adsorbed    Tetanus shot - altered mental state   Tetanus Toxoids    Tetanus shot - altered mental state        Medication List     TAKE these medications    aspirin 81 MG chewable tablet Chew 1 tablet (81 mg total) by mouth daily. Does not have to be chewed. Can swallow whole.   BEE POLLEN PO Take 3 capsules by mouth daily.   metoprolol succinate 25 MG 24 hr tablet Commonly known as: TOPROL-XL TAKE 1 TABLET(25 MG) BY MOUTH DAILY   nitroGLYCERIN 0.4 MG SL  tablet Commonly known as: Nitrostat place 1 tablet under the tongue if needed every 5 minutes for chest pain for 3 doses IF NO RELIEF AFTER 3RD DOSE CALL PRESCRIBER OR 911.   rosuvastatin 40 MG tablet Commonly known as: CRESTOR Take 1 tablet (40 mg total) by mouth daily.        Outstanding Labs/Studies   none  Duration of Discharge Encounter   Greater than 30 minutes including physician time.  Signed, Cline Crock, PA-C 10/03/2022, 9:06 AM [phone]   I have personally seen and examined this patient. I agree with the assessment and plan as outlined above.  He is doing well post TAVR. Groins stable. BP stable. Echo with no PVL.  Sinus on tele.  NO events overnight.  Discharge home today   Verne Carrow, MD, [facility] 10/03/2022 3:56 PM

## 2022-10-02 NOTE — Progress Notes (Signed)
  [facility]   MULTIDISCIPLINARY HEART VALVE TEAM  Patient doing well s/p TAVR. He is hemodynamically stable. Groin sites stable. ECG with sinus and ventricular bigeminy and no high grade block. Plan for early ambulation after bedrest completed and hopeful discharge over the next 24-48 hours.   Cline Crock PA-C  [facility]  Pager [phone]

## 2022-10-02 NOTE — CV Procedure (Signed)
[facility]  TAVR OPERATIVE NOTE   Date of Procedure:  10/02/2022  Preoperative Diagnosis: Severe Aortic Stenosis   Postoperative Diagnosis: Same   Procedure:   Transcatheter Aortic Valve Replacement - Transfemoral Approach  Edwards Sapien 3 THV (size 29 mm, model # [id number], serial #  [id number] )   Co-Surgeons:  Verne Carrow, MD and Eugenio Hoes , MD   Anesthesiologist:  Sampson Goon  Echocardiographer:  Eden Emms  Pre-operative Echo Findings: Severe aortic stenosis Normal left ventricular systolic function  Post-operative Echo Findings: No paravalvular leak Normal left ventricular systolic function  BRIEF CLINICAL NOTE AND INDICATIONS FOR SURGERY  89 yo male with history of CAD, paroxysmal atrial fibrillation, HLD, PVCs and aortic stenosis. He had a NSTEMI in 2007 and had occlusion of the obtuse marginal branch. Flow was restored with passage of the wire and no angioplasty was performed. Mild disease in the LAD at that time. He now has severe paradoxical low flow/low gradient AS. Echo with LVEF=50-55%. Moderately severe to severe aortic stenosis by calculations. The aortic valve leaflets are thickened and calcified and visually the valve appears to be severely stenotic. Mean gradient 24 mmHg, AVA 0.8 cm2, SVI 45, DI 0.29.  During the course of the patient's preoperative work up they have been evaluated comprehensively by a multidisciplinary team of specialists coordinated through the Multidisciplinary Heart Valve Clinic in the [facility].  They have been demonstrated to suffer from symptomatic severe aortic stenosis as noted above. The patient has been counseled extensively as to the relative risks and benefits of all options for the treatment of severe aortic stenosis including long term medical therapy, conventional surgery for aortic valve replacement, and transcatheter aortic valve replacement.  The patient has been independently  evaluated by Dr. Leafy Ro with CT surgery and they are felt to be at high risk for conventional surgical aortic valve replacement. The surgeon indicated the patient would be a poor candidate for conventional surgery. Based upon review of all of the patient's preoperative diagnostic tests they are felt to be candidate for transcatheter aortic valve replacement using the transfemoral approach as an alternative to high risk conventional surgery.    Following the decision to proceed with transcatheter aortic valve replacement, a discussion has been held regarding what types of management strategies would be attempted intraoperatively in the event of life-threatening complications, including whether or not the patient would be considered a candidate for the use of cardiopulmonary bypass and/or conversion to open sternotomy for attempted surgical intervention.  The patient has been advised of a variety of complications that might develop peculiar to this approach including but not limited to risks of death, stroke, paravalvular leak, aortic dissection or other major vascular complications, aortic annulus rupture, device embolization, cardiac rupture or perforation, acute myocardial infarction, arrhythmia, heart block or bradycardia requiring permanent pacemaker placement, congestive heart failure, respiratory failure, renal failure, pneumonia, infection, other late complications related to structural valve deterioration or migration, or other complications that might ultimately cause a temporary or permanent loss of functional independence or other long term morbidity.  The patient provides full informed consent for the procedure as described and all questions were answered preoperatively.    DETAILS OF THE OPERATIVE PROCEDURE  PREPARATION:   The patient is brought to the operating room on the above mentioned date and central monitoring was established by the anesthesia team including placement of a radial  arterial line. The patient is placed in the supine position on the operating table.  Intravenous antibiotics are administered. Conscious sedation is used.   Baseline transthoracic echocardiogram was performed. The patient's chest, abdomen, both groins, and both lower extremities are prepared and draped in a sterile manner. A time out procedure is performed.   PERIPHERAL ACCESS:   Using the modified Seldinger technique, femoral arterial and venous access were obtained with placement of a 6 Fr sheath in the artery and a 7 Fr sheath in the vein on the right side using u/s guidance.  A pigtail diagnostic catheter was passed through the femoral arterial sheath under fluoroscopic guidance into the aortic root.  A temporary transvenous pacemaker catheter was passed through the femoral venous sheath under fluoroscopic guidance into the right ventricle.  The pacemaker was tested to ensure stable lead placement and pacemaker capture. Aortic root angiography was performed in order to determine the optimal angiographic angle for valve deployment.  TRANSFEMORAL ACCESS:  A micropuncture kit was used to gain access to the left femoral artery using u/s guidance. Position confirmed with angiography. Pre-closure with double ProGlide closure devices. The patient was heparinized systemically and ACT verified > 250 seconds.    A 16 Fr transfemoral E-sheath was introduced into the left femoral artery after progressively dilating over an Amplatz superstiff wire. An AL-2 catheter was used to direct a straight-tip exchange length wire across the native aortic valve into the left ventricle. This was exchanged out for a pigtail catheter and position was confirmed in the LV apex. Simultaneous LV and Ao pressures were recorded.  The pigtail catheter was then exchanged for a Safari wire in the LV apex.   TRANSCATHETER HEART VALVE DEPLOYMENT:  An Edwards Sapien 3 THV (size 29 mm) was prepared and crimped per manufacturer's  guidelines, and the proper orientation of the valve is confirmed on the [employer] delivery system. The valve was advanced through the introducer sheath using normal technique until in an appropriate position in the abdominal aorta beyond the sheath tip. The balloon was then retracted and using the fine-tuning wheel was centered on the valve. The valve was then advanced across the aortic arch using appropriate flexion of the catheter. The valve was carefully positioned across the aortic valve annulus. The Commander catheter was retracted using normal technique. Once final position of the valve has been confirmed by angiographic assessment, the valve is deployed while temporarily holding ventilation and during rapid ventricular pacing to maintain systolic blood pressure < 50 mmHg and pulse pressure < 10 mmHg. The balloon inflation is held for >3 seconds after reaching full deployment volume. Once the balloon has fully deflated the balloon is retracted into the ascending aorta and valve function is assessed using TTE. There is felt to be no paravalvular leak and no central aortic insufficiency.  The patient's hemodynamic recovery following valve deployment is good.  The deployment balloon and guidewire are both removed. Echo demostrated acceptable post-procedural gradients, stable mitral valve function, and no AI.   PROCEDURE COMPLETION:  The sheath was then removed and closure devices were completed. Protamine was administered once femoral arterial repair was complete. The temporary pacemaker, pigtail catheters and femoral sheaths were removed with a Mynx closure device placed in the artery and manual pressure used for venous hemostasis.    The patient tolerated the procedure well and is transported to the surgical intensive care in stable condition. There were no immediate intraoperative complications. All sponge instrument and needle counts are verified correct at completion of the operation.   No  blood products were administered during the  operation.  The patient received a total of 60 mL of intravenous contrast during the procedure.  LVEDP: 5 mmHg  Verne Carrow MD, [facility] 10/02/2022 10:45 AM

## 2022-10-02 NOTE — Transfer of Care (Signed)
Immediate Anesthesia Transfer of Care Note  Patient: [name] [name]  Procedure(s) Performed: Transcatheter Aortic Valve Replacement, Transfemoral (Left) INTRAOPERATIVE TRANSTHORACIC ECHOCARDIOGRAM  Patient Location: Cath Lab  Anesthesia Type:MAC  Level of Consciousness: drowsy and patient cooperative  Airway & Oxygen Therapy: Patient Spontanous Breathing and Patient connected to nasal cannula oxygen  Post-op Assessment: Report given to RN, Post -op Vital signs reviewed and stable, and Patient moving all extremities X 4  Post vital signs: Reviewed and stable  Last Vitals:  Vitals Value Taken Time  BP 92/55   Temp    Pulse 61   Resp 25 10/02/22 1050  SpO2 96   Vitals shown include unvalidated device data.  Last Pain:  Vitals:   10/02/22 0708  TempSrc:   PainSc: 0-No pain      Patients Stated Pain Goal: 0 (10/02/22 0708)  Complications: There were no known notable events for this encounter.

## 2022-10-02 NOTE — Interval H&P Note (Signed)
History and Physical Interval Note:  10/02/2022 8:53 AM  [name] [name]  has presented today for surgery, with the diagnosis of Severe Aortic Stenosis.  The various methods of treatment have been discussed with the patient and family. After consideration of risks, benefits and other options for treatment, the patient has consented to  Procedure(s): Transcatheter Aortic Valve Replacement, Transfemoral (Left) INTRAOPERATIVE TRANSTHORACIC ECHOCARDIOGRAM (N/A) as a surgical intervention.  The patient's history has been reviewed, patient examined, no change in status, stable for surgery.  I have reviewed the patient's chart and labs.  Questions were answered to the patient's satisfaction.     Eugenio Hoes

## 2022-10-02 NOTE — Discharge Instructions (Signed)

## 2022-10-02 NOTE — Anesthesia Preprocedure Evaluation (Signed)
Anesthesia Evaluation  Patient identified by MRN, date of birth, ID band Patient awake    Reviewed: Allergy & Precautions, NPO status , Patient's Chart, lab work & pertinent test results  Airway Mallampati: II  TM Distance: >3 FB Neck ROM: Full    Dental   Pulmonary neg pulmonary ROS   breath sounds clear to auscultation       Cardiovascular hypertension, Pt. on home beta blockers and Pt. on medications + CAD and + Past MI  + Valvular Problems/Murmurs AS  Rhythm:Regular Rate:Normal + Systolic murmurs 1. Left ventricular ejection fraction, by estimation, is 50 to 55%. The  left ventricle has low normal function. The left ventricle has no regional  wall motion abnormalities. There is mild concentric left ventricular  hypertrophy. Left ventricular  diastolic parameters are consistent with Grade I diastolic dysfunction  (impaired relaxation).   2. Right ventricular systolic function is normal. The right ventricular  size is normal. There is normal pulmonary artery systolic pressure.   3. Trivial mitral valve regurgitation.   4. LVOT may be overestimated, SVI closer to ~39 borderline, AVA may also  be overestimated. DI 0.29. AV mean gradient 24 mmHg. . The aortic valve is  calcified. Aortic valve regurgitation is trivial. Moderate to severe  aortic valve stenosis.   5. The inferior vena cava is normal in size with greater than 50%  respiratory variability, suggesting right atrial pressure of 3 mmHg.     Neuro/Psych negative neurological ROS     GI/Hepatic negative GI ROS, Neg liver ROS,,,  Endo/Other  negative endocrine ROS    Renal/GU negative Renal ROS     Musculoskeletal   Abdominal   Peds  Hematology negative hematology ROS (+)   Anesthesia Other Findings   Reproductive/Obstetrics                             Anesthesia Physical Anesthesia Plan  ASA: 4  Anesthesia Plan: MAC    Post-op Pain Management: Tylenol PO (pre-op)*   Induction:   PONV Risk Score and Plan: 1 and Propofol infusion, Ondansetron and Treatment may vary due to age or medical condition  Airway Management Planned: Natural Airway and Simple Face Mask  Additional Equipment: Arterial line  Intra-op Plan:   Post-operative Plan:   Informed Consent: I have reviewed the patients History and Physical, chart, labs and discussed the procedure including the risks, benefits and alternatives for the proposed anesthesia with the patient or authorized representative who has indicated his/her understanding and acceptance.       Plan Discussed with: CRNA  Anesthesia Plan Comments:        Anesthesia Quick Evaluation

## 2022-10-02 NOTE — Progress Notes (Signed)
Pt admitted to rm 17 from cath lab. Initiated tele. VSS. Oriented pt to the unit. Call bell within reach.   Lawson Radar, RN

## 2022-10-03 ENCOUNTER — Inpatient Hospital Stay (HOSPITAL_COMMUNITY): Payer: Medicare Other

## 2022-10-03 ENCOUNTER — Encounter (HOSPITAL_COMMUNITY): Payer: Self-pay | Admitting: Cardiovascular Disease

## 2022-10-03 DIAGNOSIS — J849 Interstitial pulmonary disease, unspecified: Secondary | ICD-10-CM | POA: Diagnosis not present

## 2022-10-03 DIAGNOSIS — Z952 Presence of prosthetic heart valve: Secondary | ICD-10-CM

## 2022-10-03 DIAGNOSIS — I350 Nonrheumatic aortic (valve) stenosis: Secondary | ICD-10-CM | POA: Diagnosis not present

## 2022-10-03 DIAGNOSIS — Z006 Encounter for examination for normal comparison and control in clinical research program: Secondary | ICD-10-CM | POA: Diagnosis not present

## 2022-10-03 DIAGNOSIS — D696 Thrombocytopenia, unspecified: Secondary | ICD-10-CM | POA: Diagnosis not present

## 2022-10-03 LAB — BASIC METABOLIC PANEL
Anion gap: 8 (ref 5–15)
BUN: 12 mg/dL (ref 8–23)
CO2: 27 mmol/L (ref 22–32)
Calcium: 8.6 mg/dL — ABNORMAL LOW (ref 8.9–10.3)
Chloride: 101 mmol/L (ref 98–111)
Creatinine, Ser: 0.7 mg/dL (ref 0.61–1.24)
GFR, Estimated: >60 mL/min (ref >60)
Glucose, Bld: 113 mg/dL — ABNORMAL HIGH (ref 70–99)
Potassium: 3.9 mmol/L (ref 3.5–5.1)
Sodium: 136 mmol/L (ref 135–145)

## 2022-10-03 LAB — CBC
HCT: 38.9 % — ABNORMAL LOW (ref 39.0–52.0)
Hemoglobin: 13 g/dL (ref 13.0–17.0)
MCH: 32.2 pg (ref 26.0–34.0)
MCHC: 33.4 g/dL (ref 30.0–36.0)
MCV: 96.3 fL (ref 80.0–100.0)
Platelets: 116 10*3/uL — ABNORMAL LOW (ref 150–400)
RBC: 4.04 MIL/uL — ABNORMAL LOW (ref 4.22–5.81)
RDW: 11.8 % (ref 11.5–15.5)
WBC: 10.3 10*3/uL (ref 4.0–10.5)
nRBC: 0 % (ref 0.0–0.2)

## 2022-10-03 LAB — ECHOCARDIOGRAM COMPLETE
AR max vel: 2.32 cm2
AV Area VTI: 2.46 cm2
AV Area mean vel: 2.32 cm2
AV Mean grad: 4 mmHg
AV Peak grad: 6.8 mmHg
Ao pk vel: 1.31 m/s
Area-P 1/2: 2.83 cm2
Calc EF: 63.5 %
Height: 67 in
MV VTI: 2.54 cm2
S' Lateral: 3.1 cm
Single Plane A2C EF: 63.4 %
Single Plane A4C EF: 64.7 %
Weight: 2523.2 oz

## 2022-10-03 LAB — KAPPA/LAMBDA LIGHT CHAINS
Kappa free light chain: 27.3 mg/L — ABNORMAL HIGH (ref 3.3–19.4)
Kappa, lambda light chain ratio: 2.13 — ABNORMAL HIGH (ref 0.26–1.65)
Lambda free light chains: 12.8 mg/L (ref 5.7–26.3)

## 2022-10-03 LAB — MAGNESIUM: Magnesium: 1.8 mg/dL (ref 1.7–2.4)

## 2022-10-03 MED ORDER — ASPIRIN 81 MG PO CHEW: 81.0000 mg | CHEWABLE_TABLET | Freq: Every day | ORAL | Status: AC

## 2022-10-03 MED FILL — Fentanyl Citrate Preservative Free (PF) Inj 100 MCG/2ML: INTRAMUSCULAR | Qty: 0.5 | Status: AC

## 2022-10-03 NOTE — Progress Notes (Signed)
CARDIAC REHAB PHASE I   PRE:  Rate/Rhythm: 75 SR with PVCs    BP: sitting 107/66    SpO2: 95 RA  MODE:  Ambulation: 340 ft   POST:  Rate/Rhythm: 90 SR with PVCs    BP: sitting 129/66     SpO2: 95 RA  Pt ambulated without difficulty. X2 sways, able to recover. Granddaughter will be with him at home when ambulating. Discussed restrictions, walking, and CRPII. Receptive. Will refer to [facility] CRPII.   [phone]  Ethelda Chick BS, ACSM-CEP 10/03/2022 11:11 AM

## 2022-10-03 NOTE — Progress Notes (Signed)
Pt being d/c, VSS, IV removed, education complete.   Balinda Quails, RN 10/03/2022 10:42 AM

## 2022-10-04 ENCOUNTER — Telehealth: Payer: Self-pay | Admitting: Physician Assistant

## 2022-10-04 NOTE — Telephone Encounter (Signed)
  [facility]   MULTIDISCIPLINARY HEART VALVE TEAM   Patient contacted regarding discharge from [facility] on 5/15  Patient understands to follow up with a structural heart APP on 5/24 at [address]  Patient understands discharge instructions? yes Patient understands medications and regimen? yes Patient understands to bring all medications to this visit? yes  Tonnya Garbett PA-C  [facility]     

## 2022-10-04 NOTE — Anesthesia Postprocedure Evaluation (Signed)
Anesthesia Post Note  Patient: [name] [name]  Procedure(s) Performed: Transcatheter Aortic Valve Replacement, Transfemoral (Left) INTRAOPERATIVE TRANSTHORACIC ECHOCARDIOGRAM     Patient location during evaluation: PACU Anesthesia Type: MAC Level of consciousness: awake and alert Pain management: pain level controlled Vital Signs Assessment: post-procedure vital signs reviewed and stable Respiratory status: spontaneous breathing, nonlabored ventilation, respiratory function stable and patient connected to nasal cannula oxygen Cardiovascular status: stable and blood pressure returned to baseline Postop Assessment: no apparent nausea or vomiting Anesthetic complications: no   There were no known notable events for this encounter.  Last Vitals:  Vitals:   10/03/22 0300 10/03/22 0743  BP: 111/64 120/64  Pulse: 87 83  Resp: 19 16  Temp: 37 C 37.1 C  SpO2: 91% 96%    Last Pain:  Vitals:   10/03/22 0743  TempSrc: Oral  PainSc: 0-No pain                 Kennieth Rad

## 2022-10-05 LAB — MULTIPLE MYELOMA PANEL, SERUM
Albumin SerPl Elph-Mcnc: 3.3 g/dL (ref 2.9–4.4)
Albumin/Glob SerPl: 1.5 (ref 0.7–1.7)
Alpha 1: 0.2 g/dL (ref 0.0–0.4)
Alpha2 Glob SerPl Elph-Mcnc: 0.6 g/dL (ref 0.4–1.0)
B-Globulin SerPl Elph-Mcnc: 0.8 g/dL (ref 0.7–1.3)
Gamma Glob SerPl Elph-Mcnc: 0.6 g/dL (ref 0.4–1.8)
Globulin, Total: 2.3 g/dL (ref 2.2–3.9)
IgA: 281 mg/dL (ref 61–437)
IgG (Immunoglobin G), Serum: 791 mg/dL (ref 603–1613)
IgM (Immunoglobulin M), Srm: 30 mg/dL (ref 15–143)
Total Protein ELP: 5.6 g/dL — ABNORMAL LOW (ref 6.0–8.5)

## 2022-10-05 LAB — IMMUNOFIXATION, URINE

## 2022-10-11 NOTE — Progress Notes (Signed)
[facility]   MULTIDISCIPLINARY HEART VALVE CLINIC                                     Cardiology Office Note:    Date:  10/12/2022   ID:  [name] [name], DOB 1934/04/24, MRN [id number]  PCP:  Gracelyn Nurse, MD  [facility] Cardiologist:  Dietrich Pates, MD  / Dr. Clifton James & Dr. Leafy Ro (TAVR)  [facility] Electrophysiologist:  None   Referring MD: Gracelyn Nurse, MD   [facility] s/p TAVR   History of Present Illness:    [name] [name] is a 89 y.o. 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   He had a NSTEMI in 2007 and had occlusion of the obtuse marginal branch. Flow was restored with passage of the wire and no angioplasty was performed. Mild disease in the LAD at that time. Nuclear stress test in 2021 with no ischemia. He developed post-operative atrial fibrillation following a bowel resection in 2021 and was on Eliquis for a short time but this was stopped in 2022 after a monitor did not demonstrate recurrence of his atrial fib. He has been followed for moderate aortic stenosis. He recently reported progressive dyspnea on exertion and fatigue. Echo 08/16/22 showed EF 50%, and severe paradoxical LFLG AS with a mean grad 24 mmHg, peak grad 37.9 mmHg, AVA 1.12 cm2, DVI 0.29, SVI 39. R/LHC 09/06/22 showed mild non-obstructive CAD. Normal right heart pressures.    The patient was evaluated by the multidisciplinary valve team and underwent successful TAVR with a 29 mm Edwards Sapien 3 Ultra Resilia THV via the TF approach on 10/02/22. Post operative echo showed EF 60%, moderate concentric LVH, normally functioning TAVR with a mean gradient of 4 mmHg and two jets of trivial PVL. He was discharged on a baby aspirin 81mg  daily.   Today the patient presents to clinic for follow up.  He can tell a big difference in how he feels since TAVR with more energy and easier breathing. Anxious to get back to his normal activities. No CP or SOB. No LE edema, orthopnea or PND. No dizziness or syncope. No blood in stool or urine. No palpitations.    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 Clifton James & Dr. Leafy Ro   Severe aortic stenosis    Syncope    hx    Past Surgical History:  Procedure Laterality Date   APPLICATION OF WOUND VAC N/A 10/17/2019   Procedure: APPLICATION OF WOUND VAC;  Surgeon: Henrene Dodge, MD;  Location: [facility] ORS;  Service: General;  Laterality: N/A;  [id number]  APPLICATION OF WOUND VAC  10/18/2019   Procedure: APPLICATION OF WOUND VAC;  Surgeon: Henrene Dodge, MD;  Location: [facility] ORS;  Service: General;;   BOWEL RESECTION N/A 10/17/2019   Procedure: SMALL BOWEL RESECTION;  Surgeon: Henrene Dodge, MD;  Location: [facility] ORS;  Service: General;  Laterality: N/A;   BOWEL RESECTION  10/18/2019   Procedure: SMALL BOWEL RESECTION;  Surgeon: Henrene Dodge, MD;  Location: [facility] ORS;  Service: General;;   CARDIAC CATHETERIZATION     coronary arteriogram     EXPLORATORY LAPAROTOMY  1970s   for bleeding peptic ulcer   INTRAOPERATIVE TRANSTHORACIC ECHOCARDIOGRAM N/A 10/02/2022   Procedure: INTRAOPERATIVE TRANSTHORACIC ECHOCARDIOGRAM;  Surgeon: Kathleene Hazel, MD;  [facility]: [facility];  Service: Open Heart Surgery;  Laterality: N/A;   LAPAROTOMY  N/A 10/17/2019   Procedure: EXPLORATORY LAPAROTOMY;  Surgeon: Henrene Dodge, MD;  Location: [facility] ORS;  Service: General;  Laterality: N/A;   LAPAROTOMY N/A 10/18/2019   Procedure: EXPLORATORY LAPAROTOMY;  Surgeon: Henrene Dodge, MD;  Location: [facility] ORS;  Service: General;  Laterality: N/A;   left ventriculogram     RIGHT HEART CATH AND CORONARY ANGIOGRAPHY N/A 09/06/2022   Procedure: RIGHT HEART CATH AND CORONARY ANGIOGRAPHY;  Surgeon: Kathleene Hazel, MD;  Location: [facility];  Service: Cardiovascular;  Laterality: N/A;   TRANSCATHETER AORTIC VALVE REPLACEMENT, TRANSFEMORAL Left 10/02/2022   Procedure: Transcatheter Aortic Valve Replacement, Transfemoral;  Surgeon: Kathleene Hazel, MD;  Location: [facility];  Service: Open Heart Surgery;  Laterality: Left;    Current Medications: Current Meds  Medication Sig   aspirin 81 MG chewable tablet Chew 1 tablet (81 mg total) by mouth daily. Does not have to be chewed. Can swallow whole.   BEE POLLEN PO Take 3 capsules by mouth daily.   nitroGLYCERIN (NITROSTAT) 0.4 MG SL tablet place 1 tablet under the tongue if needed every 5 minutes for chest pain for 3 doses IF NO RELIEF AFTER 3RD DOSE CALL PRESCRIBER OR 911.   [DISCONTINUED] metoprolol succinate (TOPROL-XL) 25 MG 24 hr tablet TAKE 1 TABLET(25 MG) BY MOUTH DAILY   [DISCONTINUED] rosuvastatin (CRESTOR) 40 MG tablet Take 1 tablet (40 mg total) by mouth daily.     Allergies:   Tetanus toxoid, adsorbed and Tetanus toxoids   Social History   Socioeconomic History   Marital status: Divorced    Spouse name: Not on file   Number of children: 3   Years of education: Not on file   Highest education level: Not on file  Occupational History   Occupation: Retired Naval architect  Tobacco Use   Smoking status: Never   Smokeless tobacco: Former    Types: Associate Professor Use: Never used  Substance and Sexual Activity   Alcohol use: No    Alcohol/week: 0.0  standard drinks of alcohol   Drug use: No   Sexual activity: Not on file  Other Topics Concern   Not on file  Social History Narrative   He is married, divorced, and now leaving with his ex-wife. Continues to drive tractor-trailers.    Social Determinants of Health   Financial Resource Strain: Not on file  Food Insecurity: Not on file  Transportation Needs: Not on file  Physical Activity: Not on file  Stress: Not on file  Social Connections: Not on file     Family History: The patient's family history includes Heart attack (age of onset: 2) in his father; Heart attack (age  of onset: 31) in his mother.  ROS:   Please see the history of present illness.    All other systems reviewed and are negative.  EKGs/Labs/Other Studies Reviewed:    Cardiac Studies & Procedures   CARDIAC CATHETERIZATION  CARDIAC CATHETERIZATION 09/06/2022  Narrative   Mid RCA lesion is 20% stenosed.   1st Mrg lesion is 20% stenosed.   Ost LM to Mid LM lesion is 20% stenosed.   Ost LAD to Prox LAD lesion is 30% stenosed.   Mid LAD lesion is 40% stenosed.   2nd Diag lesion is 40% stenosed.  Mild non-obstructive CAD Normal right heart pressures Severe aortic stenosis by echo  Recommendations: Will continue with workup for TAVR.  Findings Coronary Findings Diagnostic  Dominance: Right  Left Main Ost LM to Mid LM lesion is 20% stenosed.  Left Anterior Descending Vessel is large. Ost LAD to Prox LAD lesion is 30% stenosed. Mid LAD lesion is 40% stenosed.  Second Diagonal Branch Vessel is moderate in size. 2nd Diag lesion is 40% stenosed.  Left Circumflex Vessel is large.  First Obtuse Marginal Branch 1st Mrg lesion is 20% stenosed.  Right Coronary Artery Vessel is large. Mid RCA lesion is 20% stenosed.  Intervention  No interventions have been documented.   STRESS TESTS  NM MYOCAR MULTI W/SPECT W 09/21/2019  Narrative  T wave inversion was noted during stress in the III  leads.  There was no ST segment deviation noted during stress.  Defect 1: There is a medium defect of severe severity present in the mid inferolateral and apical lateral location.  Findings consistent with prior myocardial infarction with mild peri-infarct ischemia.  This is an intermediate risk study.  Nuclear stress EF: 50%.   ECHOCARDIOGRAM  ECHOCARDIOGRAM COMPLETE 10/03/2022  Narrative ECHOCARDIOGRAM REPORT    Patient Name:   [name] [name] Date of Exam: 10/03/2022 Medical Rec #:  [id number]       Height:       67.0 in Accession #:    [id number]      Weight:       157.7 lb Date of Birth:  08-26-1933       BSA:          1.828 m Patient Age:    88 years        BP:           121/66 mmHg Patient Gender: M               HR:           78 bpm. Exam Location:  Inpatient  Procedure: 2D Echo, Cardiac Doppler and Color Doppler  Indications:    Post TAVR evaluation  History:        Patient has prior history of Echocardiogram examinations, most recent 10/02/2022. CAD, Aortic Valve Disease, Arrythmias:PVC and Atrial Fibrillation; Risk Factors:Dyslipidemia. Aortic Valve: 29 mm Sapien prosthetic, stented (TAVR) valve is present in the aortic position. Procedure Date: 10/02/2022.  Sonographer:    Wallie Char Referring Phys: [id number] Wille Celeste Rossana Molchan   Sonographer Comments: Technically challenging study due to limited acoustic windows and no subcostal window. IMPRESSIONS   1. There is a 29 mm Sapien valve placed. There is trivial paravalvular leak, there are two separate jets along the intraventricular septum. Mean gradient 4 mm Hg, Peak gradient 7 mm hg. DVI is normal. EOAi 1.23 cm2/m2 . 2. Left ventricular ejection fraction, by estimation, is 60 to 65%. The left ventricle has normal function. The  left ventricle has no regional wall motion abnormalities. There is moderate concentric left ventricular hypertrophy. Left ventricular diastolic parameters are indeterminate. 3. Right  ventricular systolic function is normal. The right ventricular size is dilated. 4. The mitral valve is abnormal. Trivial mitral valve regurgitation. No evidence of mitral stenosis. Moderate to severe mitral annular calcification.  Comparison(s): Stable TAVR Placement, PVL not seen on prior study.  FINDINGS Left Ventricle: Left ventricular ejection fraction, by estimation, is 60 to 65%. The left ventricle has normal function. The left ventricle has no regional wall motion abnormalities. The left ventricular internal cavity size was normal in size. There is moderate concentric left ventricular hypertrophy. Left ventricular diastolic parameters are indeterminate.  Right Ventricle: The right ventricular size is dilated. No increase in right ventricular wall thickness. Right ventricular systolic function is normal.  Left Atrium: Left atrial size was normal in size.  Right Atrium: Right atrial size was normal in size.  Pericardium: There is no evidence of pericardial effusion.  Mitral Valve: The mitral valve is abnormal. Moderate to severe mitral annular calcification. Trivial mitral valve regurgitation. No evidence of mitral valve stenosis. MV peak gradient, 6.5 mmHg. The mean mitral valve gradient is 2.0 mmHg.  Tricuspid Valve: The tricuspid valve is normal in structure. Tricuspid valve regurgitation is mild . No evidence of tricuspid stenosis.  Aortic Valve: There is trivial paravalvular leak, there are two separate jets along the intraventricular septum. Mean gradient 4 mm Hg, Peak gradient 7 mm hg. DVI is normal. EOAi 1.23 cm2/m2. The aortic valve has been repaired/replaced. Aortic valve regurgitation is trivial. Aortic valve mean gradient measures 4.0 mmHg. Aortic valve peak gradient measures 6.8 mmHg. Aortic valve area, by VTI measures 2.46 cm. There is a 29 mm Sapien prosthetic, stented (TAVR) valve present in the aortic position. Procedure Date: 10/02/2022.  Pulmonic Valve: The pulmonic  valve was not well visualized. Pulmonic valve regurgitation is trivial. No evidence of pulmonic stenosis.  Aorta: The aortic root and ascending aorta are structurally normal, with no evidence of dilitation.  IAS/Shunts: No atrial level shunt detected by color flow Doppler.   LEFT VENTRICLE PLAX 2D LVIDd:         4.70 cm     Diastology LVIDs:         3.10 cm     LV e' medial:    5.88 cm/s LV PW:         1.30 cm     LV E/e' medial:  11.4 LV IVS:        1.30 cm     LV e' lateral:   6.50 cm/s LVOT diam:     1.90 cm     LV E/e' lateral: 10.3 LV SV:         60 LV SV Index:   33 LVOT Area:     2.84 cm  LV Volumes (MOD) LV vol d, MOD A2C: 68.1 ml LV vol d, MOD A4C: 88.5 ml LV vol s, MOD A2C: 24.9 ml LV vol s, MOD A4C: 31.2 ml LV SV MOD A2C:     43.2 ml LV SV MOD A4C:     88.5 ml LV SV MOD BP:      50.0 ml  RIGHT VENTRICLE RV Basal diam:  3.70 cm RV S prime:     15.90 cm/s TAPSE (M-mode): 1.7 cm  LEFT ATRIUM           Index        RIGHT ATRIUM  Index LA diam:      3.70 cm 2.02 cm/m   RA Area:     12.80 cm LA Vol (A2C): 34.4 ml 18.82 ml/m  RA Volume:   25.90 ml  14.17 ml/m LA Vol (A4C): 48.5 ml 26.53 ml/m AORTIC VALVE AV Area (Vmax):    2.32 cm AV Area (Vmean):   2.32 cm AV Area (VTI):     2.46 cm AV Vmax:           130.67 cm/s AV Vmean:          92.300 cm/s AV VTI:            0.244 m AV Peak Grad:      6.8 mmHg AV Mean Grad:      4.0 mmHg LVOT Vmax:         107.00 cm/s LVOT Vmean:        75.550 cm/s LVOT VTI:          0.212 m LVOT/AV VTI ratio: 0.87  AORTA Ao Root diam: 3.10 cm Ao Asc diam:  3.30 cm  MITRAL VALVE                TRICUSPID VALVE MV Area (PHT): 2.83 cm     TR Peak grad:   27.7 mmHg MV Area VTI:   2.54 cm     TR Vmax:        263.00 cm/s MV Peak grad:  6.5 mmHg MV Mean grad:  2.0 mmHg     SHUNTS MV Vmax:       1.27 m/s     Systemic VTI:  0.21 m MV Vmean:      57.9 cm/s    Systemic Diam: 1.90 cm MV Decel Time: 268 msec MV E velocity:  67.00 cm/s MV A velocity: 113.00 cm/s MV E/A ratio:  0.59  Riley Lam MD Electronically signed by Riley Lam MD Signature Date/Time: 10/03/2022/10:56:58 AM    Final    MONITORS  LONG TERM MONITOR (3-14 DAYS) 12/22/2020  Narrative Patch Wear Time:  3 days and 0 hours (2022-07-29T08:14:50-0400 to 2022-08-01T08:48:15-0400)  Predominant rhythm:   SR   50 to 105 bpm  Average HR 68 bpm   Frequent PVCs  13% total  Occasional PAC Short burst SVT  Self limited  Not detected  Longest 5 beats at 171 bpm.  No triggered events   CT SCANS  CT CORONARY MORPH W/CTA COR W/SCORE 09/13/2022  Addendum 09/13/2022 11:17 PM ADDENDUM REPORT: 09/13/2022 23:15  CLINICAL DATA:  Aortic valve replacement (TAVR), pre-op eval  EXAM: Cardiac TAVR CT  TECHNIQUE: The patient was scanned on a Siemens Force 192 slice scanner. A 120 kV retrospective scan was triggered in the descending thoracic aorta at 111 HU's. Gantry rotation speed was 270 msecs and collimation was .9 mm. The 3D data set was reconstructed in 5% intervals of the R-R cycle. Systolic and diastolic phases were analyzed on a dedicated work station using MPR, MIP and VRT modes. The patient received OMNIPAQUE IOHEXOL 350 MG/ML SOLN of contrast.  FINDINGS: Aortic Valve:  Tricuspid aortic valve with severely reduced cusp excursion. Severely thickened and severely calcified aortic valve cusps.  AV calcium score: 3105  Virtual Basal Annulus Measurements:  Maximum/Minimum Diameter: 30.1 x 26.7 mm  Perimeter: 88.9 mm  Area:  615 mm2  Mild LVOT calcifications below LCC.  Membranous septal length: 5.5 mm  Based on these measurements, the annulus would be suitable for a 29 mm Sapien 3 valve.  Alternatively, Heart Team can consider 34 mm Evolut valve. Recommend Heart Team discussion for valve selection.  Sinus of Valsalva Measurements:  Non-coronary:  37 mm  Right - coronary:  35 mm  Left - coronary:  36  mm  Sinus of Valsalva Height:  Left: 29 mm  Right: 25.6 mm  Aorta: Conventional 3 vessel branch pattern of aortic arch.  Sinotubular Junction:  31 mm  Ascending Thoracic Aorta: 34.5 mm  Aortic Arch:  26 mm  Descending Thoracic Aorta:  25 mm  Coronary Artery Height above Annulus:  Left main: 22.8 mm  Right coronary: 20.5 mm  Coronary Arteries: Normal coronary origin. Right dominance. The study was performed without use of NTG and insufficient for plaque evaluation. Coronary artery calcifications.  Optimum Fluoroscopic Angle for Delivery: LAO 1 CAU 1  OTHER:  Left atrial appendage: No thrombus.  Mitral valve: Grossly normal, mild mitral annular calcifications.  Pulmonary artery: Normal caliber.  Pulmonary veins: Normal anatomy.  IMPRESSION: 1. Tricuspid aortic valve with severely reduced cusp excursion. Severely thickened and severely calcified aortic valve cusps. 2. Aortic valve calcium score: 3105 3. Annulus area: 615 mm2, suitable for 29 mm Sapien 3 valve. Mild LVOT calcifications. Membranous septal length 5.5 mm. 4. Sufficient coronary artery heights from annulus. 5. Optimum Fluoroscopic Angle for Delivery: LAO 1 CAU 1   Electronically Signed By: Weston Brass M.D. On: 09/13/2022 23:15  Narrative EXAM: OVER-READ INTERPRETATION  CT CHEST  The following report is a limited chest CT over-read performed by radiologist Dr. Allegra Lai of [facility] on 09/12/2022. This over-read does not include interpretation of cardiac or coronary anatomy or pathology. The cardiac TAVR interpretation by the cardiologist is attached.  COMPARISON:  None Available.  FINDINGS: Extracardiac findings will be described separately under dictation for contemporaneously obtained CTA chest, abdomen and pelvis.  IMPRESSION: Please see separate dictation for contemporaneously obtained CTA chest, abdomen and pelvis dated 09/13/2022 for full description  of relevant extracardiac findings.  Electronically Signed: By: Allegra Lai M.D. On: 09/13/2022 08:43          EKG:  EKG is ordered today.  The ekg ordered today demonstrates NSR HR 60  Recent Labs: 09/28/2022: ALT 18 10/03/2022: BUN 12; Creatinine, Ser 0.70; Hemoglobin 13.0; Magnesium 1.8; Platelets 116; Potassium 3.9; Sodium 136  Recent Lipid Panel    Component Value Date/Time   CHOL 148 01/18/2020 1641   TRIG 138 01/18/2020 1641   HDL 62 01/18/2020 1641   CHOLHDL 2.4 01/18/2020 1641   CHOLHDL 2.8 10/27/2018 0819   VLDL 17 10/27/2018 0819   LDLCALC 62 01/18/2020 1641     Risk Assessment/Calculations:      Physical Exam:    VS:  BP 122/78   Pulse 61   Ht 5\' 7"  (1.702 m)   Wt 159 lb (72.1 kg)   SpO2 96%   BMI 24.90 kg/m     Wt Readings from Last 3 Encounters:  10/12/22 159 lb (72.1 kg)  10/03/22 157 lb 11.2 oz (71.5 kg)  09/17/22 164 lb (74.4 kg)     GEN:  Well nourished, well developed in no acute distress HEENT: Normal NECK: No JVD LYMPHATICS: No lymphadenopathy CARDIAC: RRR, no murmurs, rubs, gallops RESPIRATORY:  Clear to auscultation without rales, wheezing or rhonchi  ABDOMEN: Soft, non-tender, non-distended MUSCULOSKELETAL:  No edema; No deformity  SKIN: Warm and dry.  Groin sites clear without hematoma or ecchymosis  NEUROLOGIC:  Alert and oriented x 3 PSYCHIATRIC:  Normal affect  ASSESSMENT:    1. S/P TAVR (transcatheter aortic valve replacement)   2. Coronary artery disease involving native coronary artery of native heart without angina pectoris   3. PAF (paroxysmal atrial fibrillation) (HCC)   4. PVC's (premature ventricular contractions)   5. Abnormal CT scan of lung   6. Amyloid heart disease (HCC)    PLAN:    In order of problems listed above:  Severe LFLG AS: doing great 1 week out from TAVR. Groin sites healing well. ECG with no HAVB. Continue a baby Asprin 81mg  daily. SBE prophylaxis discussed; the patient is edentulous and  does not go to the dentist. I will see him back for 1 month follow up and echo.    CAD: recent L/RHC showed mild non obstructive CAD. Continue aspirin, statin and BB.   PAF: post op after a bowel resection with no recurrence. Not on OAC.   PVCs: continue Toprol XL 25mg  daily.    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 Dedicated ILD protocol CT could be performed for more complete evaluation. Discussed with pt today and he is not having any SOB and would not like to pursue follow up imaging.    Elevated Raise score: given patients age, low voltage on ECG and severe LFLG AS, he was screened for amyloid with a myeloma panel, urine immunofixation and kappa/lambda chains while admitted. There was mild elevation in light chains but otherwise unremarkable. Will order PYP to complete the screening process.    Medication Adjustments/Labs and Tests Ordered: Current medicines are reviewed at length with the patient today.  Concerns regarding medicines are outlined above.  Orders Placed This Encounter  Procedures   MYOCARDIAL AMYLOID IMAGING PLANAR AND SPECT   EKG 12-Lead   Meds ordered this encounter  Medications   metoprolol succinate (TOPROL-XL) 25 MG 24 hr tablet    Sig: TAKE 1 TABLET(25 MG) BY MOUTH DAILY    Dispense:  90 tablet    Refill:  3   rosuvastatin (CRESTOR) 40 MG tablet    Sig: Take 1 tablet (40 mg total) by mouth daily.    Dispense:  90 tablet    Refill:  3    DOSE CHANGE    Patient Instructions  Medication Instructions:  Your physician recommends that you continue on your current medications as directed. Please refer to the Current Medication list given to you today.  *If you need a refill on your cardiac medications before your next appointment, please call your pharmacy*   Lab Work: NONE If you have labs (blood work) drawn today and your tests are completely normal, you will receive your results only  by: MyChart Message (if you have MyChart) OR A paper copy in the mail If you have any lab test that is abnormal or we need to change your treatment, we will call you to review the results.   Testing/Procedures: Your physician has requested that you have an Amyloid Scan.   Follow-Up:As scheduled At [facility], you and your health needs are our priority.  As part of our continuing mission to provide you with exceptional heart care, we have created designated Provider Care Teams.  These Care Teams include your primary Cardiologist (physician) and Advanced Practice Providers (APPs -  Physician Assistants and Nurse Practitioners) who all work together to provide you with the care you need, when you need it.  We recommend signing up for the patient portal called "MyChart".  Sign up information  is provided on this After Visit Summary.  MyChart is used to connect with patients for Virtual Visits (Telemedicine).  Patients are able to view lab/test results, encounter notes, upcoming appointments, etc.  Non-urgent messages can be sent to your provider as well.   To learn more about what you can do with MyChart, go to [email or web address].       Signed, Cline Crock, PA-C  10/12/2022 9:58 AM    [facility]

## 2022-10-12 ENCOUNTER — Ambulatory Visit: Payer: Medicare Other | Attending: Physician Assistant | Admitting: Physician Assistant

## 2022-10-12 VITALS — BP 122/78 | HR 61 | Ht 67.0 in | Wt 159.0 lb

## 2022-10-12 DIAGNOSIS — I2510 Atherosclerotic heart disease of native coronary artery without angina pectoris: Secondary | ICD-10-CM

## 2022-10-12 DIAGNOSIS — I493 Ventricular premature depolarization: Secondary | ICD-10-CM

## 2022-10-12 DIAGNOSIS — R918 Other nonspecific abnormal finding of lung field: Secondary | ICD-10-CM

## 2022-10-12 DIAGNOSIS — I480 Paroxysmal atrial fibrillation: Secondary | ICD-10-CM | POA: Diagnosis not present

## 2022-10-12 DIAGNOSIS — Z952 Presence of prosthetic heart valve: Secondary | ICD-10-CM

## 2022-10-12 DIAGNOSIS — E854 Organ-limited amyloidosis: Secondary | ICD-10-CM

## 2022-10-12 DIAGNOSIS — I43 Cardiomyopathy in diseases classified elsewhere: Secondary | ICD-10-CM

## 2022-10-12 MED ORDER — METOPROLOL SUCCINATE ER 25 MG PO TB24: ORAL_TABLET | ORAL | 3 refills | Status: DC

## 2022-10-12 MED ORDER — ROSUVASTATIN CALCIUM 40 MG PO TABS: 40.0000 mg | ORAL_TABLET | Freq: Every day | ORAL | 3 refills | Status: DC

## 2022-10-12 NOTE — Patient Instructions (Signed)
Medication Instructions:  Your physician recommends that you continue on your current medications as directed. Please refer to the Current Medication list given to you today.  *If you need a refill on your cardiac medications before your next appointment, please call your pharmacy*   Lab Work: NONE If you have labs (blood work) drawn today and your tests are completely normal, you will receive your results only by: MyChart Message (if you have MyChart) OR A paper copy in the mail If you have any lab test that is abnormal or we need to change your treatment, we will call you to review the results.   Testing/Procedures: Your physician has requested that you have an Amyloid Scan.   Follow-Up:As scheduled At [facility], you and your health needs are our priority.  As part of our continuing mission to provide you with exceptional heart care, we have created designated Provider Care Teams.  These Care Teams include your primary Cardiologist (physician) and Advanced Practice Providers (APPs -  Physician Assistants and Nurse Practitioners) who all work together to provide you with the care you need, when you need it.  We recommend signing up for the patient portal called "MyChart".  Sign up information is provided on this After Visit Summary.  MyChart is used to connect with patients for Virtual Visits (Telemedicine).  Patients are able to view lab/test results, encounter notes, upcoming appointments, etc.  Non-urgent messages can be sent to your provider as well.   To learn more about what you can do with MyChart, go to [email or web address].

## 2022-10-16 ENCOUNTER — Telehealth (HOSPITAL_COMMUNITY): Payer: Self-pay | Admitting: *Deleted

## 2022-10-16 NOTE — Telephone Encounter (Signed)
Left message for AMYLOID study scheduled on 10/18/22.

## 2022-10-18 ENCOUNTER — Ambulatory Visit (HOSPITAL_COMMUNITY): Payer: Medicare Other | Attending: Cardiology

## 2022-10-18 DIAGNOSIS — Z952 Presence of prosthetic heart valve: Secondary | ICD-10-CM | POA: Insufficient documentation

## 2022-10-18 DIAGNOSIS — E854 Organ-limited amyloidosis: Secondary | ICD-10-CM | POA: Diagnosis present

## 2022-10-18 DIAGNOSIS — I43 Cardiomyopathy in diseases classified elsewhere: Secondary | ICD-10-CM | POA: Diagnosis not present

## 2022-10-18 LAB — MYOCARDIAL AMYLOID PLANAR & SPECT: H/CL Ratio: 1.23

## 2022-10-18 MED ORDER — TECHNETIUM TC 99M PYROPHOSPHATE
21.4000 | Freq: Once | INTRAVENOUS | Status: AC
Start: 2022-10-18 — End: 2022-10-18
  Administered 2022-10-18: 21.4 via INTRAVENOUS

## 2022-11-02 ENCOUNTER — Ambulatory Visit: Payer: Medicare Other | Admitting: Physician Assistant

## 2022-11-02 ENCOUNTER — Ambulatory Visit (HOSPITAL_COMMUNITY): Payer: Medicare Other | Attending: Cardiology

## 2022-11-02 VITALS — BP 118/70 | HR 61 | Ht 67.0 in | Wt 144.4 lb

## 2022-11-02 DIAGNOSIS — Z952 Presence of prosthetic heart valve: Secondary | ICD-10-CM | POA: Diagnosis present

## 2022-11-02 DIAGNOSIS — E854 Organ-limited amyloidosis: Secondary | ICD-10-CM

## 2022-11-02 DIAGNOSIS — I480 Paroxysmal atrial fibrillation: Secondary | ICD-10-CM

## 2022-11-02 DIAGNOSIS — I43 Cardiomyopathy in diseases classified elsewhere: Secondary | ICD-10-CM | POA: Diagnosis present

## 2022-11-02 DIAGNOSIS — I2510 Atherosclerotic heart disease of native coronary artery without angina pectoris: Secondary | ICD-10-CM | POA: Diagnosis not present

## 2022-11-02 DIAGNOSIS — I493 Ventricular premature depolarization: Secondary | ICD-10-CM | POA: Insufficient documentation

## 2022-11-02 LAB — ECHOCARDIOGRAM COMPLETE
AV Mean grad: 5 mmHg
AV Peak grad: 8.8 mmHg
Ao pk vel: 1.48 m/s
Area-P 1/2: 1.98 cm2
S' Lateral: 2.8 cm

## 2022-11-02 NOTE — Progress Notes (Signed)
[facility]   MULTIDISCIPLINARY HEART VALVE CLINIC                                     Cardiology Office Note:    Date:  11/03/2022   ID:  [name], DOB [birth year], MRN [id number]  PCP:  Gracelyn Nurse, MD  [facility] Cardiologist:  Dietrich Pates, MD  / Dr. Clifton James & Dr. Leafy Ro (TAVR)  [facility] Electrophysiologist:  None   Referring MD: Gracelyn Nurse, MD   1 month s/p TAVR   History of Present Illness:    [name] [name] is a 89 y.o. 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   He had a NSTEMI in 2007 and had occlusion of the obtuse marginal branch. Flow was restored with passage of the wire and no angioplasty was performed. Mild disease in the LAD at that time. Nuclear stress test in 2021 with no ischemia. He developed post-operative atrial fibrillation following a bowel resection in 2021 and was on Eliquis for a short time but this was stopped in 2022 after a monitor did not demonstrate recurrence of his atrial fib. He has been followed for moderate aortic stenosis. He recently reported progressive dyspnea on exertion and fatigue. Echo 08/16/22 showed EF 50%, and severe paradoxical LFLG AS with a mean grad 24 mmHg, peak grad 37.9 mmHg, AVA 1.12 cm2, DVI 0.29, SVI 39. R/LHC 09/06/22 showed mild non-obstructive CAD. Normal right heart pressures.    The patient was evaluated by the multidisciplinary valve team and underwent successful TAVR with a 29 mm Edwards Sapien 3 Ultra Resilia THV via the TF approach on 10/02/22. Post operative echo showed EF 60%, moderate concentric LVH, normally functioning TAVR with a mean gradient of 4 mmHg and two jets of trivial PVL. He was discharged on a baby aspirin 81mg  daily.   Today the patient presents to clinic for follow  up. He can tell a big difference in how he feels since TAVR with more energy and easier breathing. No CP or SOB. No LE edema, orthopnea or PND. No dizziness or syncope. No blood in stool or urine. No palpitations.   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 Clifton James & Dr. Leafy Ro   Severe aortic stenosis    Syncope    hx    Past Surgical History:  Procedure Laterality Date   APPLICATION OF WOUND VAC N/A 10/17/2019   Procedure: APPLICATION OF WOUND VAC;  Surgeon: Henrene Dodge, MD;  Location: [facility] ORS;  Service: General;  Laterality: N/A;  [id number]    APPLICATION OF WOUND VAC  10/18/2019  Procedure: APPLICATION OF WOUND VAC;  Surgeon: Henrene Dodge, MD;  Location: [facility] ORS;  Service: General;;   BOWEL RESECTION N/A 10/17/2019   Procedure: SMALL BOWEL RESECTION;  Surgeon: Henrene Dodge, MD;  Location: [facility] ORS;  Service: General;  Laterality: N/A;   BOWEL RESECTION  10/18/2019   Procedure: SMALL BOWEL RESECTION;  Surgeon: Henrene Dodge, MD;  Location: [facility] ORS;  Service: General;;   CARDIAC CATHETERIZATION     coronary arteriogram     EXPLORATORY LAPAROTOMY  1970s   for bleeding peptic ulcer   INTRAOPERATIVE TRANSTHORACIC ECHOCARDIOGRAM N/A 10/02/2022   Procedure: INTRAOPERATIVE TRANSTHORACIC ECHOCARDIOGRAM;  Surgeon: Kathleene Hazel, MD;  [facility]: [facility];  Service: Open Heart Surgery;  Laterality: N/A;   LAPAROTOMY N/A 10/17/2019   Procedure: EXPLORATORY  LAPAROTOMY;  Surgeon: Henrene Dodge, MD;  Location: [facility] ORS;  Service: General;  Laterality: N/A;   LAPAROTOMY N/A 10/18/2019   Procedure: EXPLORATORY LAPAROTOMY;  Surgeon: Henrene Dodge, MD;  Location: [facility] OR[facility];  Service: General;  Laterality: N/A;   left ventriculogram     RIGHT HEART CATH AND CORONARY ANGIOGRAPHY N/A 09/06/2022   Procedure: RIGHT HEART CATH AND CORONARY ANGIOGRAPHY;  Surgeon: Kathleene Hazel, MD;  Location: [facility];  Service: Cardiovascular;  Laterality: N/A;   TRANSCATHETER AORTIC VALVE REPLACEMENT, TRANSFEMORAL Left 10/02/2022   Procedure: Transcatheter Aortic Valve Replacement, Transfemoral;  Surgeon: Kathleene Hazel, MD;  Location: [facility];  Service: Open Heart Surgery;  Laterality: Left;    Current Medications: Current Meds  Medication Sig   aspirin 81 MG chewable tablet Chew 1 tablet (81 mg total) by mouth daily. Does not have to be chewed. Can swallow whole.   BEE POLLEN PO Take 3 capsules by mouth daily.   metoprolol succinate (TOPROL-XL) 25 MG 24 hr tablet TAKE 1 TABLET(25 MG) BY MOUTH DAILY   nitroGLYCERIN (NITROSTAT) 0.4 MG SL tablet place 1 tablet under the tongue if needed every 5 minutes for chest pain for 3 doses IF NO RELIEF AFTER 3RD DOSE CALL PRESCRIBER OR 911.   rosuvastatin (CRESTOR) 40 MG tablet Take 1 tablet (40 mg total) by mouth daily.     Allergies:   Tetanus toxoid, adsorbed and Tetanus toxoids   Social History   Socioeconomic History   Marital status: Divorced    Spouse name: Not on file   Number of children: 3   Years of education: Not on file   Highest education level: Not on file  Occupational History   Occupation: Retired Naval architect  Tobacco Use   Smoking status: Never   Smokeless tobacco: Former    Types: Associate Professor Use: Never used  Substance and Sexual Activity   Alcohol use: No    Alcohol/week: 0.0 standard drinks of alcohol   Drug use: No   Sexual activity: Not on  file  Other Topics Concern   Not on file  Social History Narrative   He is married, divorced, and now leaving with his ex-wife. Continues to drive tractor-trailers.    Social Determinants of Health   Financial Resource Strain: Not on file  Food Insecurity: Not on file  Transportation Needs: Not on file  Physical Activity: Not on file  Stress: Not on file  Social Connections: Not on file     Family History: The patient's family history includes Heart attack (age of onset: 79) in his father; Heart attack (age of onset: 34) in his mother.  ROS:  Please see the history of present illness.    All other systems reviewed and are negative.  EKGs/Labs/Other Studies Reviewed:    Cardiac Studies & Procedures   CARDIAC CATHETERIZATION  CARDIAC CATHETERIZATION 09/06/2022  Narrative   Mid RCA lesion is 20% stenosed.   1st Mrg lesion is 20% stenosed.   Ost LM to Mid LM lesion is 20% stenosed.   Ost LAD to Prox LAD lesion is 30% stenosed.   Mid LAD lesion is 40% stenosed.   2nd Diag lesion is 40% stenosed.  Mild non-obstructive CAD Normal right heart pressures Severe aortic stenosis by echo  Recommendations: Will continue with workup for TAVR.  Findings Coronary Findings Diagnostic  Dominance: Right  Left Main Ost LM to Mid LM lesion is 20% stenosed.  Left Anterior Descending Vessel is large. Ost LAD to Prox LAD lesion is 30% stenosed. Mid LAD lesion is 40% stenosed.  Second Diagonal Branch Vessel is moderate in size. 2nd Diag lesion is 40% stenosed.  Left Circumflex Vessel is large.  First Obtuse Marginal Branch 1st Mrg lesion is 20% stenosed.  Right Coronary Artery Vessel is large. Mid RCA lesion is 20% stenosed.  Intervention  No interventions have been documented.   STRESS TESTS  NM MYOCAR MULTI W/SPECT W 09/21/2019  Narrative  T wave inversion was noted during stress in the III leads.  There was no ST segment deviation noted during stress.   Defect 1: There is a medium defect of severe severity present in the mid inferolateral and apical lateral location.  Findings consistent with prior myocardial infarction with mild peri-infarct ischemia.  This is an intermediate risk study.  Nuclear stress EF: 50%.   ECHOCARDIOGRAM  ECHOCARDIOGRAM COMPLETE 11/02/2022  Narrative ECHOCARDIOGRAM REPORT    Patient Name:   [name] [name] Date of Exam: 11/02/2022 Medical Rec #:  [id number]       Height:       67.0 in Accession #:    [id number]      Weight:       159.0 lb Date of Birth:  November 20, 1933       BSA:          1.834 m Patient Age:    88 years        BP:           134/78 mmHg Patient Gender: M               HR:           56 bpm. Exam Location:  [address]  Procedure: 2D Echo, Cardiac Doppler, Color Doppler and Strain Analysis  Indications:    Z95.2 Post TAVR evaluation (29mm Edwards Sapien 3 Ultra Resilia)  History:        Patient has prior history of Echocardiogram examinations, most recent 10/02/2022. CAD, Arrythmias:PVC and Atrial Fibrillation; Risk Factors:Dyslipidemia. Severe aortic stenosis.  Sonographer:    Cathie Beams RCS Referring Phys: [id number] Janika Jedlicka R Sherrill Mckamie  IMPRESSIONS   1. Left ventricular ejection fraction, by estimation, is 55 to 60%. The left ventricle has normal function. The left ventricle has no regional wall motion abnormalities. There is mild concentric left ventricular hypertrophy. Left ventricular diastolic parameters are consistent with Grade I diastolic dysfunction (impaired relaxation). GLS -14.5%. 2. The mitral valve is normal in structure. Trivial mitral valve regurgitation. 3. There is a well seated 29mm Edwards Sapien prosthesis in the aortic position. There is mild paravalvular regurgitation. Procedure Date: 10/02/22. Aortic valve mean gradient measures 5.0 mmHg. 4.  Right ventricular systolic function is normal. The right ventricular size is normal. 5. The inferior vena cava is normal in  size with greater than 50% respiratory variability, suggesting right atrial pressure of 3 mmHg.  FINDINGS Left Ventricle: Left ventricular ejection fraction, by estimation, is 55 to 60%. The left ventricle has normal function. The left ventricle has no regional wall motion abnormalities. The left ventricular internal cavity size was normal in size. There is mild concentric left ventricular hypertrophy. Left ventricular diastolic parameters are consistent with Grade I diastolic dysfunction (impaired relaxation).  Right Ventricle: The right ventricular size is normal. No increase in right ventricular wall thickness. Right ventricular systolic function is normal.  Left Atrium: Left atrial size was normal in size.  Right Atrium: Right atrial size was normal in size.  Pericardium: There is no evidence of pericardial effusion.  Mitral Valve: The mitral valve is normal in structure. There is mild calcification of the mitral valve leaflet(s). Mild mitral annular calcification. Trivial mitral valve regurgitation.  Tricuspid Valve: The tricuspid valve is normal in structure. Tricuspid valve regurgitation is mild.  Aortic Valve: The aortic valve has been repaired/replaced. Aortic valve regurgitation is mild. Aortic valve mean gradient measures 5.0 mmHg. Aortic valve peak gradient measures 8.8 mmHg. There is a 29 mm Sapien prosthetic, stented (TAVR) valve present in the aortic position.  Pulmonic Valve: The pulmonic valve was normal in structure. Pulmonic valve regurgitation is trivial.  Aorta: The aortic root is normal in size and structure.  Venous: The inferior vena cava is normal in size with greater than 50% respiratory variability, suggesting right atrial pressure of 3 mmHg.  IAS/Shunts: No atrial level shunt detected by color flow Doppler.   LEFT VENTRICLE PLAX 2D LVIDd:         4.50 cm Diastology LVIDs:         2.80 cm LV e' medial:    5.98 cm/s LV PW:         1.20 cm LV E/e' medial:   9.6 LV IVS:        1.20 cm LV e' lateral:   8.16 cm/s LV E/e' lateral: 7.1   RIGHT VENTRICLE RV Basal diam:  3.30 cm RV S prime:     7.94 cm/s TAPSE (M-mode): 2.0 cm RVSP:           26.4 mmHg  LEFT ATRIUM           Index        RIGHT ATRIUM           Index LA diam:      4.10 cm 2.24 cm/m   RA Pressure: 3.00 mmHg LA Vol (A2C): 39.6 ml 21.59 ml/m  RA Area:     13.60 cm LA Vol (A4C): 32.3 ml 17.61 ml/m  RA Volume:   29.70 ml  16.19 ml/m AORTIC VALVE                    PULMONIC VALVE AV Vmax:           148.00 cm/s  PR End Diast Vel: 6.66 msec AV Vmean:          112.000 cm/s AV VTI:            0.332 m AV Peak Grad:      8.8 mmHg AV Mean Grad:      5.0 mmHg LVOT Vmax:         74.30 cm/s LVOT Vmean:  43.800 cm/s LVOT VTI:          0.166 m LVOT/AV VTI ratio: 0.50  AORTA Ao Asc diam: 3.70 cm  MITRAL VALVE               TRICUSPID VALVE MV Area (PHT): 1.98 cm    TR Peak grad:   23.4 mmHg MV Decel Time: 383 msec    TR Vmax:        242.00 cm/s MV E velocity: 57.60 cm/s  Estimated RAP:  3.00 mmHg MV A velocity: 92.00 cm/s  RVSP:           26.4 mmHg MV E/A ratio:  0.63 SHUNTS Systemic VTI: 0.17 m  Aditya Sabharwal Electronically signed by Dorthula Nettles Signature Date/Time: 11/02/2022/6:21:27 PM    Final    MONITORS  LONG TERM MONITOR (3-14 DAYS) 12/22/2020  Narrative Patch Wear Time:  3 days and 0 hours (2022-07-29T08:14:50-0400 to 2022-08-01T08:48:15-0400)  Predominant rhythm:   SR   50 to 105 bpm  Average HR 68 bpm   Frequent PVCs  13% total  Occasional PAC Short burst SVT  Self limited  Not detected  Longest 5 beats at 171 bpm.  No triggered events   CT SCANS  CT CORONARY MORPH W/CTA COR W/SCORE 09/13/2022  Addendum 09/13/2022 11:17 PM ADDENDUM REPORT: 09/13/2022 23:15  CLINICAL DATA:  Aortic valve replacement (TAVR), pre-op eval  EXAM: Cardiac TAVR CT  TECHNIQUE: The patient was scanned on a Siemens Force 192 slice scanner. A 120 kV retrospective  scan was triggered in the descending thoracic aorta at 111 HU's. Gantry rotation speed was 270 msecs and collimation was .9 mm. The 3D data set was reconstructed in 5% intervals of the R-R cycle. Systolic and diastolic phases were analyzed on a dedicated work station using MPR, MIP and VRT modes. The patient received OMNIPAQUE IOHEXOL 350 MG/ML SOLN of contrast.  FINDINGS: Aortic Valve:  Tricuspid aortic valve with severely reduced cusp excursion. Severely thickened and severely calcified aortic valve cusps.  AV calcium score: 3105  Virtual Basal Annulus Measurements:  Maximum/Minimum Diameter: 30.1 x 26.7 mm  Perimeter: 88.9 mm  Area:  615 mm2  Mild LVOT calcifications below LCC.  Membranous septal length: 5.5 mm  Based on these measurements, the annulus would be suitable for a 29 mm Sapien 3 valve. Alternatively, Heart Team can consider 34 mm Evolut valve. Recommend Heart Team discussion for valve selection.  Sinus of Valsalva Measurements:  Non-coronary:  37 mm  Right - coronary:  35 mm  Left - coronary:  36 mm  Sinus of Valsalva Height:  Left: 29 mm  Right: 25.6 mm  Aorta: Conventional 3 vessel branch pattern of aortic arch.  Sinotubular Junction:  31 mm  Ascending Thoracic Aorta: 34.5 mm  Aortic Arch:  26 mm  Descending Thoracic Aorta:  25 mm  Coronary Artery Height above Annulus:  Left main: 22.8 mm  Right coronary: 20.5 mm  Coronary Arteries: Normal coronary origin. Right dominance. The study was performed without use of NTG and insufficient for plaque evaluation. Coronary artery calcifications.  Optimum Fluoroscopic Angle for Delivery: LAO 1 CAU 1  OTHER:  Left atrial appendage: No thrombus.  Mitral valve: Grossly normal, mild mitral annular calcifications.  Pulmonary artery: Normal caliber.  Pulmonary veins: Normal anatomy.  IMPRESSION: 1. Tricuspid aortic valve with severely reduced cusp excursion. Severely thickened and  severely calcified aortic valve cusps. 2. Aortic valve calcium score: 3105 3. Annulus area: 615 mm2, suitable for 29 mm  Sapien 3 valve. Mild LVOT calcifications. Membranous septal length 5.5 mm. 4. Sufficient coronary artery heights from annulus. 5. Optimum Fluoroscopic Angle for Delivery: LAO 1 CAU 1   Electronically Signed By: Weston Brass M.D. On: 09/13/2022 23:15  Narrative EXAM: OVER-READ INTERPRETATION  CT CHEST  The following report is a limited chest CT over-read performed by radiologist Dr. Allegra Lai of [facility] on 09/12/2022. This over-read does not include interpretation of cardiac or coronary anatomy or pathology. The cardiac TAVR interpretation by the cardiologist is attached.  COMPARISON:  None Available.  FINDINGS: Extracardiac findings will be described separately under dictation for contemporaneously obtained CTA chest, abdomen and pelvis.  IMPRESSION: Please see separate dictation for contemporaneously obtained CTA chest, abdomen and pelvis dated 09/13/2022 for full description of relevant extracardiac findings.  Electronically Signed: By: Allegra Lai M.D. On: 09/13/2022 08:43    PYP SCAN  MYOCARDIAL AMYLOID PLANAR AND SPECT 10/18/2022  Narrative   Myocardial uptake was negative for radiotracer uptake. The visual grade of myocardial uptake relative to the ribs was Grade 0 (No myocardial uptake and normal bone uptake).   Findings are not suggestive (Grade 0) of cardiac ATTR amyloidosis.        EKG:  EKG is NOT ordered today  Recent Labs: 09/28/2022: ALT 18 10/03/2022: BUN 12; Creatinine, Ser 0.70; Hemoglobin 13.0; Magnesium 1.8; Platelets 116; Potassium 3.9; Sodium 136  Recent Lipid Panel    Component Value Date/Time   CHOL 148 01/18/2020 1641   TRIG 138 01/18/2020 1641   HDL 62 01/18/2020 1641   CHOLHDL 2.4 01/18/2020 1641   CHOLHDL 2.8 10/27/2018 0819   VLDL 17 10/27/2018 0819   LDLCALC 62 01/18/2020 1641      Risk Assessment/Calculations:      Physical Exam:    VS:  BP 118/70   Pulse 61   Ht 5\' 7"  (1.702 m)   Wt 144 lb 6.4 oz (65.5 kg)   SpO2 99%   BMI 22.62 kg/m     Wt Readings from Last 3 Encounters:  11/02/22 144 lb 6.4 oz (65.5 kg)  10/18/22 159 lb (72.1 kg)  10/12/22 159 lb (72.1 kg)     GEN:  Well nourished, well developed in no acute distress HEENT: Normal NECK: No JVD LYMPHATICS: No lymphadenopathy CARDIAC: RRR, no murmurs, rubs, gallops RESPIRATORY:  Clear to auscultation without rales, wheezing or rhonchi  ABDOMEN: Soft, non-tender, non-distended MUSCULOSKELETAL:  No edema; No deformity  SKIN: Warm and dry.  NEUROLOGIC:  Alert and oriented x 3 PSYCHIATRIC:  Normal affect   ASSESSMENT:    1. S/P TAVR (transcatheter aortic valve replacement)   2. Coronary artery disease involving native coronary artery of native heart without angina pectoris   3. PAF (paroxysmal atrial fibrillation) (HCC)   4. PVC's (premature ventricular contractions)   5. Amyloid heart disease screening     PLAN:    In order of problems listed above:  Severe LFLG AS s/p TAVR: echo today shows EF 55%, normally functioning TAVR with a mean gradient of 5 mm hg and mild PVL. He has NYHA class I symptoms. Continue a baby Asprin 81mg  daily. SBE prophylaxis discussed; the patient is edentulous and does not go to the dentist. I will see him back for 1 year follow up and echo.    CAD: recent L/RHC showed mild non obstructive CAD. Continue aspirin, statin and BB.   PAF: post op after a bowel resection with no recurrence. Not on OAC.   PVCs:  continue Toprol XL 25mg  daily.    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 Dedicated ILD protocol CT could be performed for more complete evaluation. Discussed with pt and he is not having any SOB and would not like to pursue follow up imaging.    Elevated Raise score: given patients age, low voltage  on ECG and severe LFLG AS, he was screened for amyloid with a myeloma panel, urine immunofixation and kappa/lambda chains while admitted. There was mild elevation in light chains but otherwise unremarkable. PYP was negative for amyloid   Medication Adjustments/Labs and Tests Ordered: Current medicines are reviewed at length with the patient today.  Concerns regarding medicines are outlined above.  No orders of the defined types were placed in this encounter.  No orders of the defined types were placed in this encounter.   Patient Instructions  Medication Instructions:  Your physician recommends that you continue on your current medications as directed. Please refer to the Current Medication list given to you today.  *If you need a refill on your cardiac medications before your next appointment, please call your pharmacy*   Lab Work: NONE If you have labs (blood work) drawn today and your tests are completely normal, you will receive your results only by: MyChart Message (if you have MyChart) OR A paper copy in the mail If you have any lab test that is abnormal or we need to change your treatment, we will call you to review the results.   Testing/Procedures: NONE   Follow-Up: At [facility], you and your health needs are our priority.  As part of our continuing mission to provide you with exceptional heart care, we have created designated Provider Care Teams.  These Care Teams include your primary Cardiologist (physician) and Advanced Practice Providers (APPs -  Physician Assistants and Nurse Practitioners) who all work together to provide you with the care you need, when you need it.  We recommend signing up for the patient portal called "MyChart".  Sign up information is provided on this After Visit Summary.  MyChart is used to connect with patients for Virtual Visits (Telemedicine).  Patients are able to view lab/test results, encounter notes, upcoming appointments, etc.   Non-urgent messages can be sent to your provider as well.   To learn more about what you can do with MyChart, go to [email or web address].    Your next appointment:   KEEP SCHEDULED FOLLOW-UP   Signed, Cline Crock, PA-C  11/03/2022 10:01 AM    [facility]

## 2022-11-02 NOTE — Patient Instructions (Signed)
Medication Instructions:  Your physician recommends that you continue on your current medications as directed. Please refer to the Current Medication list given to you today.  *If you need a refill on your cardiac medications before your next appointment, please call your pharmacy*   Lab Work: NONE If you have labs (blood work) drawn today and your tests are completely normal, you will receive your results only by: MyChart Message (if you have MyChart) OR A paper copy in the mail If you have any lab test that is abnormal or we need to change your treatment, we will call you to review the results.   Testing/Procedures: NONE   Follow-Up: At [facility], you and your health needs are our priority.  As part of our continuing mission to provide you with exceptional heart care, we have created designated Provider Care Teams.  These Care Teams include your primary Cardiologist (physician) and Advanced Practice Providers (APPs -  Physician Assistants and Nurse Practitioners) who all work together to provide you with the care you need, when you need it.  We recommend signing up for the patient portal called "MyChart".  Sign up information is provided on this After Visit Summary.  MyChart is used to connect with patients for Virtual Visits (Telemedicine).  Patients are able to view lab/test results, encounter notes, upcoming appointments, etc.  Non-urgent messages can be sent to your provider as well.   To learn more about what you can do with MyChart, go to [email or web address].    Your next appointment:   KEEP SCHEDULED FOLLOW-UP 

## 2022-12-23 NOTE — Progress Notes (Deleted)
[facility]   MULTIDISCIPLINARY HEART VALVE CLINIC                                     Cardiology Office Note:    Date:  12/23/2022   ID:  [name] [name], DOB 04/05/1934, MRN [id number]  PCP:  Gracelyn Nurse, MD  [facility] Cardiologist:  Dietrich Pates, MD  / Dr. Clifton James & Dr. Leafy Ro (TAVR)  [facility] Electrophysiologist:  None   Referring MD: Gracelyn Nurse, MD   1 month s/p TAVR   History of Present Illness:    [name] [name] is a 89 y.o. 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   He had a NSTEMI in 2007 and had occlusion of the obtuse marginal branch. Flow was restored with passage of the wire and no angioplasty was performed. Mild disease in the LAD at that time. Nuclear stress test in 2021 with no ischemia. He developed post-operative atrial fibrillation following a bowel resection in 2021 and was on Eliquis for a short time but this was stopped in 2022 after a monitor did not demonstrate recurrence of his atrial fib. He has been followed for moderate aortic stenosis. He recently reported progressive dyspnea on exertion and fatigue. Echo 08/16/22 showed EF 50%, and severe paradoxical LFLG AS with a mean grad 24 mmHg, peak grad 37.9 mmHg, AVA 1.12 cm2, DVI 0.29, SVI 39. R/LHC 09/06/22 showed mild non-obstructive CAD. Normal right heart pressures.    The patient was evaluated by the multidisciplinary valve team and underwent successful TAVR with a 29 mm Edwards Sapien 3 Ultra Resilia THV via the TF approach on 10/02/22. Post operative echo showed EF 60%, moderate concentric LVH, normally functioning TAVR with a mean gradient of 4 mmHg and two jets of trivial PVL. He was discharged on a baby aspirin 81mg  daily.   Today the patient presents to clinic for follow  up. He can tell a big difference in how he feels since TAVR with more energy and easier breathing. No CP or SOB. No LE edema, orthopnea or PND. No dizziness or syncope. No blood in stool or urine. No palpitations.   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 Clifton James & Dr. Leafy Ro   Severe aortic stenosis    Syncope    hx    Past Surgical History:  Procedure Laterality Date   APPLICATION OF WOUND VAC N/A 10/17/2019   Procedure: APPLICATION OF WOUND VAC;  Surgeon: Henrene Dodge, MD;  Location: [facility] OR[facility];  Service: General;  Laterality: N/A;  [id number]    APPLICATION OF WOUND VAC  10/18/2019  Procedure: APPLICATION OF WOUND VAC;  Surgeon: Henrene Dodge, MD;  Location: [facility] OR[facility];  Service: General;;   BOWEL RESECTION N/A 10/17/2019   Procedure: SMALL BOWEL RESECTION;  Surgeon: Henrene Dodge, MD;  Location: [facility] OR[facility];  Service: General;  Laterality: N/A;   BOWEL RESECTION  10/18/2019   Procedure: SMALL BOWEL RESECTION;  Surgeon: Henrene Dodge, MD;  Location: [facility];  Service: General;;   CARDIAC CATHETERIZATION     coronary arteriogram     EXPLORATORY LAPAROTOMY  1970s   for bleeding peptic ulcer   INTRAOPERATIVE TRANSTHORACIC ECHOCARDIOGRAM N/A 10/02/2022   Procedure: INTRAOPERATIVE TRANSTHORACIC ECHOCARDIOGRAM;  Surgeon: Kathleene Hazel, MD;  Location: [facility];  Service: Open Heart Surgery;  Laterality: N/A;   LAPAROTOMY N/A 10/17/2019   Procedure: EXPLORATORY  LAPAROTOMY;  Surgeon: Henrene Dodge, MD;  Location: [facility];  Service: General;  Laterality: N/A;   LAPAROTOMY N/A 10/18/2019   Procedure: EXPLORATORY LAPAROTOMY;  Surgeon: Henrene Dodge, MD;  Location: [facility];  Service: General;  Laterality: N/A;   left ventriculogram     RIGHT HEART CATH AND CORONARY ANGIOGRAPHY N/A 09/06/2022   Procedure: RIGHT HEART CATH AND CORONARY ANGIOGRAPHY;  Surgeon: Kathleene Hazel, MD;  Location: [facility];  Service: Cardiovascular;  Laterality: N/A;   TRANSCATHETER AORTIC VALVE REPLACEMENT, TRANSFEMORAL Left 10/02/2022   Procedure: Transcatheter Aortic Valve Replacement, Transfemoral;  Surgeon: Kathleene Hazel, MD;  Location: [facility];  Service: Open Heart Surgery;  Laterality: Left;    Current Medications: No outpatient medications have been marked as taking for the 12/27/22 encounter (Appointment) with Pricilla Riffle, MD.     Allergies:   Tetanus toxoid, adsorbed and Tetanus toxoids   Social History   Socioeconomic History   Marital status: Divorced    Spouse name: Not on file   Number of children: 3   Years of education: Not on file   Highest education level: Not on file  Occupational History   Occupation: Retired Naval architect  Tobacco Use   Smoking status: Never   Smokeless tobacco: Former    Types: Associate Professor status: Never Used  Substance and Sexual Activity   Alcohol use: No    Alcohol/week: 0.0 standard drinks of alcohol   Drug use: No   Sexual activity: Not on file  Other Topics Concern   Not on file  Social History Narrative   He is married, divorced, and now leaving with his ex-wife. Continues to drive tractor-trailers.    Social Determinants of Health   Financial Resource Strain: Not on file  Food Insecurity: Not on file  Transportation Needs: Not on file  Physical Activity: Not on file  Stress: Not on file  Social Connections: Not on file     Family History: The patient's  family history includes Heart attack (age of onset: 40) in his father; Heart attack (age of onset: 29) in his mother.  ROS:   Please see the history of present illness.    All other systems reviewed and are negative.  EKGs/Labs/Other Studies Reviewed:    Cardiac Studies & Procedures   CARDIAC CATHETERIZATION  CARDIAC CATHETERIZATION 09/06/2022  Narrative   Mid RCA lesion is 20% stenosed.   1st Mrg lesion is 20% stenosed.   Ost LM to Mid LM lesion is 20% stenosed.   Ost LAD to Prox LAD lesion is 30% stenosed.   Mid LAD lesion is 40% stenosed.   2nd Diag lesion is 40%  stenosed.  Mild non-obstructive CAD Normal right heart pressures Severe aortic stenosis by echo  Recommendations: Will continue with workup for TAVR.  Findings Coronary Findings Diagnostic  Dominance: Right  Left Main Ost LM to Mid LM lesion is 20% stenosed.  Left Anterior Descending Vessel is large. Ost LAD to Prox LAD lesion is 30% stenosed. Mid LAD lesion is 40% stenosed.  Second Diagonal Branch Vessel is moderate in size. 2nd Diag lesion is 40% stenosed.  Left Circumflex Vessel is large.  First Obtuse Marginal Branch 1st Mrg lesion is 20% stenosed.  Right Coronary Artery Vessel is large. Mid RCA lesion is 20% stenosed.  Intervention  No interventions have been documented.   STRESS TESTS  NM MYOCAR MULTI W/SPECT W 09/21/2019  Narrative  T wave inversion was noted during stress in the III leads.  There was no ST segment deviation noted during stress.  Defect 1: There is a medium defect of severe severity present in the mid inferolateral and apical lateral location.  Findings consistent with prior myocardial infarction with mild peri-infarct ischemia.  This is an intermediate risk study.  Nuclear stress EF: 50%.   ECHOCARDIOGRAM  ECHOCARDIOGRAM COMPLETE 11/02/2022  Narrative ECHOCARDIOGRAM REPORT    Patient Name:   MACE WEINBERG Date of Exam: 11/02/2022 Medical Rec #:   [id number]       Height:       67.0 in Accession #:    [id number]      Weight:       159.0 lb Date of Birth:  13-Jun-1933       BSA:          1.834 m Patient Age:    88 years        BP:           134/78 mmHg Patient Gender: M               HR:           56 bpm. Exam Location:  [address]  Procedure: 2D Echo, Cardiac Doppler, Color Doppler and Strain Analysis  Indications:    Z95.2 Post TAVR evaluation (29mm Edwards Sapien 3 Ultra Resilia)  History:        Patient has prior history of Echocardiogram examinations, most recent 10/02/2022. CAD, Arrythmias:PVC and Atrial Fibrillation; Risk Factors:Dyslipidemia. Severe aortic stenosis.  Sonographer:    Cathie Beams RCS Referring Phys: [id number] KATHRYN R THOMPSON  IMPRESSIONS   1. Left ventricular ejection fraction, by estimation, is 55 to 60%. The left ventricle has normal function. The left ventricle has no regional wall motion abnormalities. There is mild concentric left ventricular hypertrophy. Left ventricular diastolic parameters are consistent with Grade I diastolic dysfunction (impaired relaxation). GLS -14.5%. 2. The mitral valve is normal in structure. Trivial mitral valve regurgitation. 3. There is a well seated 29mm Edwards Sapien prosthesis in the aortic position. There is mild paravalvular regurgitation. Procedure Date: 10/02/22. Aortic valve mean gradient measures 5.0 mmHg. 4. Right ventricular systolic function is normal. The right ventricular size is normal. 5. The inferior vena cava is normal in size with greater than 50% respiratory variability, suggesting right atrial pressure of 3 mmHg.  FINDINGS Left Ventricle: Left ventricular ejection fraction, by estimation, is 55 to 60%. The left ventricle has normal function. The left ventricle has no regional wall motion abnormalities. The left ventricular internal cavity size was normal in size. There is mild concentric left ventricular hypertrophy. Left ventricular diastolic  parameters are consistent with Grade I diastolic  dysfunction (impaired relaxation).  Right Ventricle: The right ventricular size is normal. No increase in right ventricular wall thickness. Right ventricular systolic function is normal.  Left Atrium: Left atrial size was normal in size.  Right Atrium: Right atrial size was normal in size.  Pericardium: There is no evidence of pericardial effusion.  Mitral Valve: The mitral valve is normal in structure. There is mild calcification of the mitral valve leaflet(s). Mild mitral annular calcification. Trivial mitral valve regurgitation.  Tricuspid Valve: The tricuspid valve is normal in structure. Tricuspid valve regurgitation is mild.  Aortic Valve: The aortic valve has been repaired/replaced. Aortic valve regurgitation is mild. Aortic valve mean gradient measures 5.0 mmHg. Aortic valve peak gradient measures 8.8 mmHg. There is a 29 mm Sapien prosthetic, stented (TAVR) valve present in the aortic position.  Pulmonic Valve: The pulmonic valve was normal in structure. Pulmonic valve regurgitation is trivial.  Aorta: The aortic root is normal in size and structure.  Venous: The inferior vena cava is normal in size with greater than 50% respiratory variability, suggesting right atrial pressure of 3 mmHg.  IAS/Shunts: No atrial level shunt detected by color flow Doppler.   LEFT VENTRICLE PLAX 2D LVIDd:         4.50 cm Diastology LVIDs:         2.80 cm LV e' medial:    5.98 cm/s LV PW:         1.20 cm LV E/e' medial:  9.6 LV IVS:        1.20 cm LV e' lateral:   8.16 cm/s LV E/e' lateral: 7.1   RIGHT VENTRICLE RV Basal diam:  3.30 cm RV S prime:     7.94 cm/s TAPSE (M-mode): 2.0 cm RVSP:           26.4 mmHg  LEFT ATRIUM           Index        RIGHT ATRIUM           Index LA diam:      4.10 cm 2.24 cm/m   RA Pressure: 3.00 mmHg LA Vol (A2C): 39.6 ml 21.59 ml/m  RA Area:     13.60 cm LA Vol (A4C): 32.3 ml 17.61 ml/m  RA Volume:    29.70 ml  16.19 ml/m AORTIC VALVE                    PULMONIC VALVE AV Vmax:           148.00 cm/s  PR End Diast Vel: 6.66 msec AV Vmean:          112.000 cm/s AV VTI:            0.332 m AV Peak Grad:      8.8 mmHg AV Mean Grad:      5.0 mmHg LVOT Vmax:         74.30 cm/s LVOT Vmean:        43.800 cm/s LVOT VTI:          0.166 m LVOT/AV VTI ratio: 0.50  AORTA Ao Asc diam: 3.70 cm  MITRAL VALVE               TRICUSPID VALVE MV Area (PHT): 1.98 cm    TR Peak grad:   23.4 mmHg MV Decel Time: 383 msec    TR Vmax:        242.00 cm/s MV E velocity: 57.60 cm/s  Estimated RAP:  3.00 mmHg MV  A velocity: 92.00 cm/s  RVSP:           26.4 mmHg MV E/A ratio:  0.63 SHUNTS Systemic VTI: 0.17 m  Aditya Sabharwal Electronically signed by Dorthula Nettles Signature Date/Time: 11/02/2022/6:21:27 PM    Final    MONITORS  LONG TERM MONITOR (3-14 DAYS) 12/22/2020  Narrative Patch Wear Time:  3 days and 0 hours (2022-07-29T08:14:50-0400 to 2022-08-01T08:48:15-0400)  Predominant rhythm:   SR   50 to 105 bpm  Average HR 68 bpm   Frequent PVCs  13% total  Occasional PAC Short burst SVT  Self limited  Not detected  Longest 5 beats at 171 bpm.  No triggered events   CT SCANS  CT CORONARY MORPH W/CTA COR W/SCORE 09/12/2022  Addendum 09/13/2022 11:17 PM ADDENDUM REPORT: 09/13/2022 23:15  CLINICAL DATA:  Aortic valve replacement (TAVR), pre-op eval  EXAM: Cardiac TAVR CT  TECHNIQUE: The patient was scanned on a Siemens Force 192 slice scanner. A 120 kV retrospective scan was triggered in the descending thoracic aorta at 111 HU's. Gantry rotation speed was 270 msecs and collimation was .9 mm. The 3D data set was reconstructed in 5% intervals of the R-R cycle. Systolic and diastolic phases were analyzed on a dedicated work station using MPR, MIP and VRT modes. The patient received OMNIPAQUE IOHEXOL 350 MG/ML SOLN of contrast.  FINDINGS: Aortic Valve:  Tricuspid aortic valve with  severely reduced cusp excursion. Severely thickened and severely calcified aortic valve cusps.  AV calcium score: 3105  Virtual Basal Annulus Measurements:  Maximum/Minimum Diameter: 30.1 x 26.7 mm  Perimeter: 88.9 mm  Area:  615 mm2  Mild LVOT calcifications below LCC.  Membranous septal length: 5.5 mm  Based on these measurements, the annulus would be suitable for a 29 mm Sapien 3 valve. Alternatively, Heart Team can consider 34 mm Evolut valve. Recommend Heart Team discussion for valve selection.  Sinus of Valsalva Measurements:  Non-coronary:  37 mm  Right - coronary:  35 mm  Left - coronary:  36 mm  Sinus of Valsalva Height:  Left: 29 mm  Right: 25.6 mm  Aorta: Conventional 3 vessel branch pattern of aortic arch.  Sinotubular Junction:  31 mm  Ascending Thoracic Aorta: 34.5 mm  Aortic Arch:  26 mm  Descending Thoracic Aorta:  25 mm  Coronary Artery Height above Annulus:  Left main: 22.8 mm  Right coronary: 20.5 mm  Coronary Arteries: Normal coronary origin. Right dominance. The study was performed without use of NTG and insufficient for plaque evaluation. Coronary artery calcifications.  Optimum Fluoroscopic Angle for Delivery: LAO 1 CAU 1  OTHER:  Left atrial appendage: No thrombus.  Mitral valve: Grossly normal, mild mitral annular calcifications.  Pulmonary artery: Normal caliber.  Pulmonary veins: Normal anatomy.  IMPRESSION: 1. Tricuspid aortic valve with severely reduced cusp excursion. Severely thickened and severely calcified aortic valve cusps. 2. Aortic valve calcium score: 3105 3. Annulus area: 615 mm2, suitable for 29 mm Sapien 3 valve. Mild LVOT calcifications. Membranous septal length 5.5 mm. 4. Sufficient coronary artery heights from annulus. 5. Optimum Fluoroscopic Angle for Delivery: LAO 1 CAU 1   Electronically Signed By: Weston Brass M.D. On: 09/13/2022 23:15  Narrative EXAM: OVER-READ INTERPRETATION  CT  CHEST  The following report is a limited chest CT over-read performed by radiologist Dr. Allegra Lai of [facility] on 09/12/2022. This over-read does not include interpretation of cardiac or coronary anatomy or pathology. The cardiac TAVR interpretation by the cardiologist is  attached.  COMPARISON:  None Available.  FINDINGS: Extracardiac findings will be described separately under dictation for contemporaneously obtained CTA chest, abdomen and pelvis.  IMPRESSION: Please see separate dictation for contemporaneously obtained CTA chest, abdomen and pelvis dated 09/13/2022 for full description of relevant extracardiac findings.  Electronically Signed: By: Allegra Lai M.D. On: 09/13/2022 08:43    PYP SCAN  MYOCARDIAL AMYLOID PLANAR AND SPECT 10/18/2022  Narrative   Myocardial uptake was negative for radiotracer uptake. The visual grade of myocardial uptake relative to the ribs was Grade 0 (No myocardial uptake and normal bone uptake).   Findings are not suggestive (Grade 0) of cardiac ATTR amyloidosis.        EKG:  EKG is NOT ordered today  Recent Labs: 09/28/2022: ALT 18 10/03/2022: BUN 12; Creatinine, Ser 0.70; Hemoglobin 13.0; Magnesium 1.8; Platelets 116; Potassium 3.9; Sodium 136  Recent Lipid Panel    Component Value Date/Time   CHOL 148 01/18/2020 1641   TRIG 138 01/18/2020 1641   HDL 62 01/18/2020 1641   CHOLHDL 2.4 01/18/2020 1641   CHOLHDL 2.8 10/27/2018 0819   VLDL 17 10/27/2018 0819   LDLCALC 62 01/18/2020 1641     Risk Assessment/Calculations:      Physical Exam:    VS:  There were no vitals taken for this visit.    Wt Readings from Last 3 Encounters:  11/02/22 144 lb 6.4 oz (65.5 kg)  10/18/22 159 lb (72.1 kg)  10/12/22 159 lb (72.1 kg)     GEN:  Well nourished, well developed in no acute distress HEENT: Normal NECK: No JVD LYMPHATICS: No lymphadenopathy CARDIAC: RRR, no murmurs, rubs, gallops RESPIRATORY:  Clear to  auscultation without rales, wheezing or rhonchi  ABDOMEN: Soft, non-tender, non-distended MUSCULOSKELETAL:  No edema; No deformity  SKIN: Warm and dry.  NEUROLOGIC:  Alert and oriented x 3 PSYCHIATRIC:  Normal affect   ASSESSMENT:    No diagnosis found.   PLAN:    In order of problems listed above:  Severe LFLG AS s/p TAVR: echo today shows EF 55%, normally functioning TAVR with a mean gradient of 5 mm hg and mild PVL. He has NYHA class I symptoms. Continue a baby Asprin 81mg  daily. SBE prophylaxis discussed; the patient is edentulous and does not go to the dentist. I will see him back for 1 year follow up and echo.    CAD: recent L/RHC showed mild non obstructive CAD. Continue aspirin, statin and BB.   PAF: post op after a bowel resection with no recurrence. Not on OAC.   PVCs: continue Toprol XL 25mg  daily.    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 Dedicated ILD protocol CT could be performed for more complete evaluation. Discussed with pt and he is not having any SOB and would not like to pursue follow up imaging.    Elevated Raise score: given patients age, low voltage on ECG and severe LFLG AS, he was screened for amyloid with a myeloma panel, urine immunofixation and kappa/lambda chains while admitted. There was mild elevation in light chains but otherwise unremarkable. PYP was negative for amyloid   Medication Adjustments/Labs and Tests Ordered: Current medicines are reviewed at length with the patient today.  Concerns regarding medicines are outlined above.  No orders of the defined types were placed in this encounter.  No orders of the defined types were placed in this encounter.   There are no Patient Instructions on file for this  visit.   Signed, Dietrich Pates, MD  12/23/2022 2:16 PM    [facility]

## 2022-12-27 ENCOUNTER — Ambulatory Visit: Payer: Medicare Other | Admitting: Internal Medicine

## 2023-04-22 NOTE — Progress Notes (Unsigned)
Cardiology Office Note   Date:  04/23/2023   ID:  [name] [name], DOB August 18, 1933, MRN [id number]  PCP:  Gracelyn Nurse, MD  Cardiologist:   Dietrich Pates, MD   F/u of CAD     History of Present Illness: [name] [name] is a 89 y.o. male with a history of CAD HL, and AS      Hx is  s/p NSTEMI with  LHC 2/07 demonstrated an occluded OM, mild nonobstructive disease in the LAD with 20 and 40% lesions and preserved LV function. He had return of perfusion in his OM with wire passage alone. No balloon angioplasty or stenting was required.    Holter monitor 2/13: Sinus rhythm, frequent PVCs, couplets and triplets, occasional PACs.   Spring 2021, pt admitted for chest pain  Myovue was normal    Echo  2021  Mild AS After this visit the patient was admitted at the end of May for abdominal pain.  He went on to have excision of 2 feet of necrotic bowel.  Postoperatively he developed atrial fibrillation.  He was placed on Eliquis.  Monitor in Jan 2022 showed no atrial fibrillation   Frequent PVCs     Recomm that he stop Eliquis    I saw the pt last Feb 2024   He noted increased SOB   Echo in March 2024 LVEF 50%   Mean gradient across AV was 24 mm Hg  Dimensionless index o.29  SVI 39   Pt went on to have R/L heart cath   Mild nonobstructive CAD   Normal R heart prssures The pt went on to have TAVR on 10/02/22  (29 mm Edwards Sapien 3 Utra Resilia THV.   Echo post shows prosthesis working well  The pt says he is feeling much better   Breathing is good   Has more energy   Denies CP    Occasional dizziness with quick standing     Outpatient Medications Prior to Visit  Medication Sig Dispense Refill   aspirin 81 MG chewable tablet Chew 1 tablet (81 mg total) by mouth daily. Does not have to be chewed. Can swallow whole.     BEE POLLEN PO Take 3 capsules by mouth daily.     metoprolol succinate (TOPROL-XL) 25 MG 24 hr tablet TAKE 1 TABLET(25 MG) BY MOUTH DAILY 90 tablet 3   nitroGLYCERIN (NITROSTAT)  0.4 MG SL tablet place 1 tablet under the tongue if needed every 5 minutes for chest pain for 3 doses IF NO RELIEF AFTER 3RD DOSE CALL PRESCRIBER OR 911. 25 tablet 2   rosuvastatin (CRESTOR) 40 MG tablet Take 1 tablet (40 mg total) by mouth daily. 90 tablet 3   No facility-administered medications prior to visit.     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 Clifton James & Dr. Leafy Ro  Severe aortic stenosis    Syncope    hx    Past Surgical History:  Procedure Laterality Date   APPLICATION OF WOUND VAC N/A 10/17/2019   Procedure: APPLICATION OF WOUND VAC;  Surgeon: Henrene Dodge, MD;  Location: [facility];  Service: General;  Laterality: N/A;  [id number]    APPLICATION OF WOUND VAC  10/18/2019   Procedure: APPLICATION OF WOUND VAC;  Surgeon: Henrene Dodge, MD;  Location: [facility];  Service: General;;   BOWEL RESECTION N/A 10/17/2019   Procedure: SMALL BOWEL RESECTION;  Surgeon: Henrene Dodge, MD;  Location: [facility];  Service: General;  Laterality: N/A;   BOWEL RESECTION  10/18/2019   Procedure: SMALL BOWEL RESECTION;  Surgeon: Henrene Dodge, MD;  Location: [facility];  Service: General;;   CARDIAC CATHETERIZATION     coronary arteriogram     EXPLORATORY LAPAROTOMY  1970s   for bleeding peptic ulcer   INTRAOPERATIVE TRANSTHORACIC ECHOCARDIOGRAM N/A 10/02/2022   Procedure: INTRAOPERATIVE TRANSTHORACIC ECHOCARDIOGRAM;   Surgeon: Kathleene Hazel, MD;  [facility];  Service: Open Heart Surgery;  Laterality: N/A;   LAPAROTOMY N/A 10/17/2019   Procedure: EXPLORATORY LAPAROTOMY;  Surgeon: Henrene Dodge, MD;  Location: [facility];  Service: General;  Laterality: N/A;   LAPAROTOMY N/A 10/18/2019   Procedure: EXPLORATORY LAPAROTOMY;  Surgeon: Henrene Dodge, MD;  Location: [facility];  Service: General;  Laterality: N/A;   left ventriculogram     RIGHT HEART CATH AND CORONARY ANGIOGRAPHY N/A 09/06/2022   Procedure: RIGHT HEART CATH AND CORONARY ANGIOGRAPHY;  Surgeon: Kathleene Hazel, MD;  Location: [facility];  Service: Cardiovascular;  Laterality: N/A;   TRANSCATHETER AORTIC VALVE REPLACEMENT, TRANSFEMORAL Left 10/02/2022   Procedure: Transcatheter Aortic Valve Replacement, Transfemoral;  Surgeon: Kathleene Hazel, MD;  Location: [facility];  Service: Open Heart Surgery;  Laterality: Left;     Social History:  The patient  reports that he has never smoked. He has quit using smokeless tobacco.  His smokeless tobacco use included chew. He reports that he does not drink alcohol and does not use drugs.   Family History:  The patient's family history includes Heart attack (age of onset: 78) in his father; Heart attack (age of onset: 56) in his mother.    ROS:  Please see the history of present illness. All other systems are reviewed and  Negative to the above problem except as noted.    PHYSICAL EXAM: VS:  BP 124/74   Pulse 71   Ht 5\' 7"  (1.702 m)   Wt 154 lb (69.9 kg)   SpO2 94%   BMI 24.12 kg/m   GEN: thin 89 yo in no a cute distress  HEENT: NCAT   Neck: JVP normal. Cardiac: RRR; Gr I/VI systolic murmur . No  LE edema  Respiratory:  clear to auscultation  GI: soft, nontender No hepatomegaly  Extremities: No lower extremity edema  EKG:  EKG is  not ordered today  Echo   JUne 2024  1. Left ventricular ejection fraction, by estimation, is 55 to 60%. The   left ventricle has normal function. The left ventricle has no regional  wall motion abnormalities. There is mild concentric left ventricular  hypertrophy. Left ventricular diastolic  parameters are consistent with Grade I diastolic dysfunction (impaired  relaxation). GLS -14.5%.   2. The mitral valve is normal in structure. Trivial mitral valve  regurgitation.   3. There is a well seated 29mm Edwards Sapien prosthesis in the  aortic  position. There is mild paravalvular regurgitation. Procedure Date:  10/02/22. Aortic valve mean gradient measures 5.0 mmHg.   4. Right ventricular systolic function is normal. The right ventricular  size is normal.   5. The inferior vena cava is normal in size with greater than 50%  respiratory variability, suggesting right atrial pressure of 3 mmHg.    Echo Nov 2023     1. Left ventricular ejection fraction, by estimation, is 50 to 55%. The  left ventricle has low normal function. The left ventricle has no regional  wall motion abnormalities. Left ventricular diastolic parameters are  consistent with Grade I diastolic  dysfunction (impaired relaxation).   2. Right ventricular systolic function is normal. The right ventricular  size is normal. There is mildly elevated pulmonary artery systolic  pressure. The estimated right ventricular systolic pressure is 37.8 mmHg.   3. The mitral valve is degenerative. Trivial mitral valve regurgitation.  No evidence of mitral stenosis. Moderate mitral annular calcification.   4. The aortic valve is calcified. There is severe calcifcation of the  aortic valve. There is severe thickening of the aortic valve. Aortic valve  regurgitation is not visualized. Moderate to severe aortic valve stenosis.  Aortic valve area, by VTI  measures 1.51 cm. Aortic valve mean gradient measures 20.0 mmHg. Aortic  valve Vmax measures 3.00 m/s.   5. The inferior vena cava is normal in size with <50% respiratory  variability,  suggesting right atrial pressure of 8 mmHg.    Echo AUg 2023    1. Left ventricular ejection fraction, by estimation, is 40 to 45%. The  left ventricle has mildly decreased function. The left ventricle  demonstrates regional wall motion abnormalities with basal to mid inferior  and inferolateral hypokinesis. There is  mild left ventricular hypertrophy. Left ventricular diastolic parameters  are consistent with Grade I diastolic dysfunction (impaired relaxation).   2. Right ventricular systolic function is mildly reduced. The right  ventricular size is moderately enlarged. There is normal pulmonary artery  systolic pressure. The estimated right ventricular systolic pressure is  24.9 mmHg.   3. Left atrial size was mildly dilated.   4. The mitral valve is degenerative. No evidence of mitral valve  regurgitation. No evidence of mitral stenosis. Moderate mitral annular  calcification.   5. The aortic valve is tricuspid. There is severe calcifcation of the  aortic valve. Aortic valve regurgitation is trivial. Moderate to severe  aortic valve stenosis. Aortic valve area, by VTI measures 0.95 cm. Aortic  valve mean gradient measures 22.0  mmHg. Possible low flow/low gradient severe aortic stenosis (borderline).   6. The inferior vena cava is normal in size with greater than 50%  respiratory variability, suggesting right atrial pressure of 3 mmHg    Lipid Panel    Component Value Date/Time   CHOL 148 01/18/2020 1641   TRIG 138 01/18/2020 1641   HDL 62 01/18/2020 1641   CHOLHDL 2.4 01/18/2020 1641   CHOLHDL 2.8 10/27/2018 0819   VLDL 17 10/27/2018 0819   LDLCALC 62 01/18/2020 1641      Wt Readings from Last 3 Encounters:  04/23/23 154 lb (69.9 kg)  11/02/22 144 lb 6.4 oz (65.5 kg)  10/18/22 159 lb (72.1 kg)      ASSESSMENT AND PLAN:    Aortic stenosis .  PT s/p TAVR in May 09811   Doing well  Breathing is much better   Follow     2   CAD.  RLast heart  cath in April 2024  mild nonobstructive CAD     3  HL   continue on Crestor   LDL 65  HDL 66   Continue   5 PAF. Occurred after bowel surgery   Monitor in Jan 2022 showed no afib  Elqius stopped   Follow No symptoms of palpitations   6  PVCs   Monitor in Aug 2022 showed SR with 13% PVCs Pt denies  palpitations    Follow up in the summer   Current medicines are reviewed at length with the patient today.  The patient does not have concerns regarding medicines.  Signed, Dietrich Pates, MD  04/23/2023 9:54 PM    [facility] [address] [place], [place], [place]  [address] Phone: [phone]; Fax: [phone]

## 2023-04-23 ENCOUNTER — Ambulatory Visit: Payer: Medicare Other | Attending: Internal Medicine | Admitting: Internal Medicine

## 2023-04-23 ENCOUNTER — Encounter: Payer: Self-pay | Admitting: Internal Medicine

## 2023-04-23 VITALS — BP 124/74 | HR 71 | Ht 67.0 in | Wt 154.0 lb

## 2023-04-23 DIAGNOSIS — I2510 Atherosclerotic heart disease of native coronary artery without angina pectoris: Secondary | ICD-10-CM

## 2023-04-23 NOTE — Patient Instructions (Signed)
Medication Instructions:   *If you need a refill on your cardiac medications before your next appointment, please call your pharmacy*   Lab Work:  If you have labs (blood work) drawn today and your tests are completely normal, you will receive your results only by: MyChart Message (if you have MyChart) OR A paper copy in the mail If you have any lab test that is abnormal or we need to change your treatment, we will call you to review the results.   Testing/Procedures:    Follow-Up: At [facility], you and your health needs are our priority.  As part of our continuing mission to provide you with exceptional heart care, we have created designated Provider Care Teams.  These Care Teams include your primary Cardiologist (physician) and Advanced Practice Providers (APPs -  Physician Assistants and Nurse Practitioners) who all work together to provide you with the care you need, when you need it.  We recommend signing up for the patient portal called "MyChart".  Sign up information is provided on this After Visit Summary.  MyChart is used to connect with patients for Virtual Visits (Telemedicine).  Patients are able to view lab/test results, encounter notes, upcoming appointments, etc.  Non-urgent messages can be sent to your provider as well.   To learn more about what you can do with MyChart, go to [email or web address].    Your next appointment:

## 2023-04-29 ENCOUNTER — Other Ambulatory Visit: Payer: Self-pay | Admitting: Physician Assistant

## 2023-04-29 DIAGNOSIS — Z952 Presence of prosthetic heart valve: Secondary | ICD-10-CM

## 2023-09-27 ENCOUNTER — Ambulatory Visit: Payer: Medicare Other

## 2023-09-27 ENCOUNTER — Other Ambulatory Visit (HOSPITAL_COMMUNITY): Payer: Medicare Other

## 2023-09-27 NOTE — Progress Notes (Unsigned)
 [facility]   MULTIDISCIPLINARY HEART VALVE CLINIC                                     Cardiology Office Note:    Date:  09/27/2023   ID:  Armenta Landau, DOB 07/09/1933, MRN [id number]  PCP:  Little Riff, MD  [facility] Cardiologist:  Ola Berger, MD  [facility] Structural heart: Antoinette Batman, MD [facility] Electrophysiologist:  None   Referring MD: Little Riff, MD   1 year s/p TAVR  History of Present Illness:    [name] [name] is a 89 y.o. male with a hx of  CAD (NSTEMI in 2007 w/occlusion of OM branch; flow was restored with passage of wire), post op PAF with no recurrence (not on OAC), HLD, PVCs and severe LFLG aortic stenosis s/p TAVR (10/11/22) who presents to clinic for follow up.    He had a NSTEMI in 2007 and had occlusion of the obtuse marginal branch. Flow was restored with passage of the wire and no angioplasty was performed. Mild disease in the LAD at that time. Nuclear stress test in 2021 with no ischemia. He developed post-operative atrial fibrillation following a bowel resection in 2021 and was on Eliquis  for a short time but this was stopped in 2022 after a monitor did not demonstrate recurrence of his atrial fib. He has been followed for moderate aortic stenosis. Echo 08/16/22 showed EF 50%, and severe paradoxical LFLG AS with a mean grad 24 mmHg, AVA 1.12 cm2, DVI 0.29. [facility] 09/06/22 showed mild non-obstructive CAD. Normal right heart pressures. S/p TAVR with a 29 mm Edwards Sapien 3 Ultra Resilia THV via the TF approach on 10/02/22. Post operative echo showed EF 60%, moderate concentric LVH, normally functioning TAVR with a mean gradient of 4 mmHg and two jets of trivial PVL. He was discharged on a baby aspirin  81mg  daily. He was screened for amyloid heart disease with labs and a PYP scan which were negative.    Today the patient presents to clinic for follow up. He can tell a big difference in how he feels since TAVR with more  energy and easier breathing.   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 Abel Hoe & Dr. Honey Lusty   Severe aortic stenosis    Syncope    hx     Current Medications: No outpatient medications have been marked as taking for the 09/30/23 encounter (Appointment) with Ardia Kraft, PA-C.      ROS:   Please see the history of present illness.    All other systems reviewed and are negative.  EKGs       Risk Assessment/Calculations:            Physical Exam:    VS:  There were no vitals taken for this visit.  Wt Readings from Last 3 Encounters:  04/23/23 154 lb (69.9 kg)  11/02/22 144 lb 6.4 oz (65.5 kg)  10/18/22 159 lb (72.1 kg)     GEN: Well nourished, well developed in no acute distress NECK: No JVD CARDIAC: ***RRR, no murmurs, rubs, gallops RESPIRATORY:  Clear to auscultation without rales, wheezing or rhonchi  ABDOMEN: Soft, non-tender, non-distended EXTREMITIES:  No edema; No deformity.    ASSESSMENT:    1. S/P TAVR (transcatheter aortic valve replacement)   2. Coronary artery disease involving native coronary artery of native heart without angina pectoris   3. PAF (paroxysmal atrial fibrillation) (HCC)   4. PVC's (premature ventricular contractions)     PLAN:    In order of problems listed above:  Severe AS s/p TAVR:  -- Echo today shows EF ***, normally functioning TAVR with a mean gradient of *** mm hg and *** PVL.  -- NYHA class ***  symptoms.  -- Continue ***.  -- SBE discussed- pt has full dentures.  -- I will see back for 1 month/year office visit with echo.  CAD: -- [facility] 08/2022 showed mild non obstructive CAD.  -- Continue aspirin , statin and BB.   PAF:  -- Post op after a bowel resection with no recurrence.  -- Not on OAC.   PVCs:  -- Monitor in Aug 2022 showed SR with 13% PVCs  -- Continue Toprol  XL 25mg  daily.    Medication Adjustments/Labs and Tests Ordered: Current medicines are reviewed at length with the patient today.  Concerns regarding medicines are outlined above.  No orders of the defined types were placed in this encounter.  No orders of the defined types were placed in this encounter.   There are no Patient Instructions on file for this visit.   Signed, Abagail Hoar, PA-C  09/27/2023 3:54 PM    [facility]

## 2023-09-30 ENCOUNTER — Other Ambulatory Visit (HOSPITAL_COMMUNITY): Payer: Medicare Other

## 2023-09-30 ENCOUNTER — Ambulatory Visit: Payer: Medicare Other | Attending: Physician Assistant | Admitting: Physician Assistant

## 2023-09-30 ENCOUNTER — Encounter: Payer: Self-pay | Admitting: Physician Assistant

## 2023-09-30 ENCOUNTER — Ambulatory Visit (HOSPITAL_COMMUNITY): Payer: Medicare Other | Attending: Cardiology

## 2023-09-30 VITALS — BP 120/58 | HR 59 | Ht 67.0 in | Wt 147.8 lb

## 2023-09-30 DIAGNOSIS — Z952 Presence of prosthetic heart valve: Secondary | ICD-10-CM

## 2023-09-30 DIAGNOSIS — I493 Ventricular premature depolarization: Secondary | ICD-10-CM | POA: Diagnosis not present

## 2023-09-30 DIAGNOSIS — I2510 Atherosclerotic heart disease of native coronary artery without angina pectoris: Secondary | ICD-10-CM | POA: Diagnosis not present

## 2023-09-30 DIAGNOSIS — I480 Paroxysmal atrial fibrillation: Secondary | ICD-10-CM | POA: Diagnosis not present

## 2023-09-30 LAB — ECHOCARDIOGRAM COMPLETE
AV Mean grad: 5.2 mmHg
AV Peak grad: 9.2 mmHg
Ao pk vel: 1.52 m/s
Area-P 1/2: 2.66 cm2
S' Lateral: 3 cm

## 2023-09-30 NOTE — Patient Instructions (Signed)
 Medication Instructions:  Your physician recommends that you continue on your current medications as directed. Please refer to the Current Medication list given to you today.  *If you need a refill on your cardiac medications before your next appointment, please call your pharmacy*  Lab Work: None ordered.  You may go to any [facility] Location for your lab work:  [facility] - [address] ([facility]) - [address] [id number]  [place] - [address]   [place]  - [address]   [place] - [address] - [address] [employer]  - [address] [place] - [address] ([facility]   If you have labs (blood work) drawn today and your tests are completely normal, you will receive your results only by: [employer] (if you have MyChart)  If you have any lab test that is abnormal or we need to change your treatment, we will call you or send a MyChart message to review the results.  Testing/Procedures: None ordered.  Follow-Up: At [facility], you and your health needs are our priority.  As part of our continuing mission to provide you with exceptional heart care, we have created designated Provider Care Teams.  These Care Teams include your primary Cardiologist (physician) and Advanced Practice Providers (APPs -  Physician Assistants and Nurse Practitioners) who all work together to provide you with the care you need, when you need it.  We recommend signing up for the patient portal called "MyChart".  Sign up information is provided on this After Visit Summary.  MyChart is used to connect with patients for Virtual Visits (Telemedicine).  Patients are able to view lab/test results, encounter notes, upcoming appointments, etc.  Non-urgent messages can be sent to your provider as well.   To learn more about what you can do with MyChart, go to  [email or web address].    Your next appointment:   As scheduled  The format for your next appointment:   In Person  Provider:   Ola Berger, MD

## 2023-11-03 ENCOUNTER — Other Ambulatory Visit: Payer: Self-pay | Admitting: Physician Assistant

## 2024-01-15 NOTE — Progress Notes (Unsigned)
 Cardiology Office Note   Date:  01/16/2024   ID:  [name] [name], DOB 1933/12/21, MRN [id number]  PCP:  [name], MD  Cardiologist:   Vina Gull, MD   F/u of CAD and AV dz     History of Present Illness: [name] [name] is a 89 y.o. male with a history of CAD HL, and AS     2007  Pt is  s/p NSTEMI.  LHC  demonstrated an occluded OM, mild nonobstructive disease in the LAD with 20 and 40% lesions and preserved LV function. He had return of perfusion in his OM with wire passage alone. No balloon angioplasty or stenting was required.   2019  Monitor showed Sinus rhythm, frequent PVCs, couplets and triplets, occasional PACs.   Spring 2021, pt admitted for chest pain  Myovue was normal    Echo  2021  Mild AS  May 2021  Pt admitted for  abdominal pain.  He went on to have excision of 2 feet of necrotic bowel.  Postoperatively he developed atrial fibrillation.  He was placed on Eliquis .  Jan 2022  MOnitor showed no atrial fibrillation   Frequent PVCs     Recomm that he stop Eliquis     Feb 2024   He noted increased SOB   Echo in March 2024 LVEF 50%   Mean gradient across AV was 24 mm Hg  Dimensionless index o.29  SVI 39   Pt went on to have R/L heart cath   Mild nonobstructive CAD   Normal R heart prssures The pt went on to have TAVR on 10/02/22  (29 mm Edwards Sapien 3 [address] [place] THV.    I saw the pt in Dec 2024   He was seen by MARLA Hummer in [place] 2025  Echo in May showed normal LVEF/RVEF   Atria normal in size  AV prosthesis working well  Mild PVL  The pt says he feels great   Breathing is good  NO CP  No dizziness    NO palpitations  Works in yard    [employer] for [employer]       Lipids in April 2025 LDL 53, HDL 54    Outpatient Medications Prior to Visit  Medication Sig Dispense Refill   aspirin  81 MG chewable tablet Chew 1 tablet (81 mg total) by mouth daily. Does not have to be chewed. Can swallow whole.     BEE POLLEN PO Take 3 capsules by mouth daily.      metoprolol  succinate (TOPROL -XL) 25 MG 24 hr tablet TAKE 1 TABLET(25 MG) BY MOUTH DAILY 90 tablet 3   nitroGLYCERIN  (NITROSTAT ) 0.4 MG SL tablet place 1 tablet under the tongue if needed every 5 minutes for chest pain for 3 doses IF NO RELIEF AFTER 3RD DOSE CALL PRESCRIBER OR 911. 25 tablet 2   rosuvastatin  (CRESTOR ) 40 MG tablet TAKE 1 TABLET(40 MG) BY MOUTH DAILY 90 tablet 3   No facility-administered medications prior to visit.     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 Verlin & Dr. Maryjane   Severe aortic stenosis    Syncope    hx    Past Surgical History:  Procedure Laterality Date   APPLICATION OF WOUND VAC N/A 10/17/2019   Procedure: APPLICATION OF WOUND VAC;  Surgeon: Desiderio Schanz, MD;  Location: [facility];  Service: General;  Laterality: N/A;  [id number]    APPLICATION OF WOUND VAC  10/18/2019   Procedure: APPLICATION OF WOUND VAC;  Surgeon: Desiderio Schanz, MD;  Location: [facility];  Service: General;;   BOWEL RESECTION N/A 10/17/2019   Procedure: SMALL BOWEL RESECTION;  Surgeon: Desiderio Schanz, MD;  Location: [facility];  Service: General;  Laterality: N/A;   BOWEL RESECTION  10/18/2019   Procedure: SMALL BOWEL RESECTION;  Surgeon: Desiderio Schanz, MD;  Location: [facility];  Service: General;;   CARDIAC CATHETERIZATION     coronary arteriogram     EXPLORATORY LAPAROTOMY  1970s   for bleeding peptic ulcer    INTRAOPERATIVE TRANSTHORACIC ECHOCARDIOGRAM N/A 10/02/2022   Procedure: INTRAOPERATIVE TRANSTHORACIC ECHOCARDIOGRAM;  Surgeon: Verlin Lonni BIRCH, MD;  Location: [facility];  Service: Open Heart Surgery;  Laterality: N/A;   LAPAROTOMY N/A 10/17/2019   Procedure: EXPLORATORY LAPAROTOMY;  Surgeon: Desiderio Schanz, MD;  Location: [facility];  Service: General;  Laterality: N/A;   LAPAROTOMY N/A 10/18/2019   Procedure: EXPLORATORY LAPAROTOMY;  Surgeon: Desiderio Schanz, MD;  Location: [facility];  Service: General;  Laterality: N/A;   left ventriculogram     RIGHT HEART CATH AND CORONARY ANGIOGRAPHY N/A 09/06/2022   Procedure: RIGHT HEART CATH AND CORONARY ANGIOGRAPHY;  Surgeon: Verlin Lonni BIRCH, MD;  Location: [facility];  Service: Cardiovascular;  Laterality: N/A;   TRANSCATHETER AORTIC VALVE REPLACEMENT, TRANSFEMORAL Left 10/02/2022   Procedure: Transcatheter Aortic Valve Replacement, Transfemoral;  Surgeon: Verlin Lonni BIRCH, MD;  Location: [facility];  Service: Open Heart Surgery;  Laterality: Left;     Social History:  The patient  reports that he has never smoked. He has quit using smokeless tobacco.  His smokeless tobacco use included chew. He reports that he does not drink alcohol and does not use drugs.   Family History:  The patient's family history includes Heart attack (age of onset: 85) in his father; Heart attack (age of onset: 45) in his mother.    ROS:  Please see the history of present illness. All other systems are reviewed and  Negative to the above problem except as noted.    PHYSICAL EXAM: VS:  BP (!) 108/52   Pulse (!) 52   Ht 5' 7 (1.702 m)   Wt 146 lb 6.4 oz (66.4 kg)   SpO2 97%   BMI 22.93 kg/m   GEN: thin 89 yo in no acute distress  HEENT: NCAT   Neck: JVP normal.  No bruits  Cardiac: RRR; Gr I/VI systolic murmur . No  LE edema  Respiratory:  clear to auscultation  GI: soft, nontender No hepatomegaly  Extremities: No lower  extremity edema  EKG:  EKG is  not ordered today  Echo   May 2025   1. Left ventricular ejection fraction, by estimation, is 60 to 65%. The  left ventricle has normal function. The left ventricle has no regional  wall motion abnormalities. There is  mild left ventricular hypertrophy.  Left ventricular diastolic parameters  are consistent with Grade I diastolic dysfunction (impaired relaxation).   2. Right ventricular systolic function is normal. The right ventricular  size is mildly enlarged. There is normal pulmonary artery systolic  pressure. The estimated right ventricular systolic pressure is 30.0 mmHg.   3. The mitral valve is degenerative. Trivial mitral valve regurgitation.  No evidence of mitral stenosis.   4. The aortic valve has been repaired/replaced. Aortic valve  regurgitation is mild. There is a 29 mm Edwards Sapien prosthetic (TAVR)  valve present in the aortic position. Vmax 1.6 m/s, MG , DI 0.45.  Mild PVL   5. The inferior vena cava is normal in size with greater than 50%  respiratory variability, suggesting right atrial pressure of 3 mmHg.     Lipid Panel    Component Value Date/Time   CHOL 148 01/18/2020 1641   TRIG 138 01/18/2020 1641   HDL 62 01/18/2020 1641   CHOLHDL 2.4 01/18/2020 1641   CHOLHDL 2.8 10/27/2018 0819   VLDL 17 10/27/2018 0819   LDLCALC 62 01/18/2020 1641      Wt Readings from Last 3 Encounters:  01/16/24 146 lb 6.4 oz (66.4 kg)  09/30/23 147 lb 12.8 oz (67 kg)  04/23/23 154 lb (69.9 kg)      ASSESSMENT AND PLAN:    Aortic stenosis .  PT s/p TAVR in May [id number]   Echo in May 2025  Valve prosthesis functioning well   2   CAD.  RLast heart cath in April 2024 mild nonobstructive CAD     3  HL   continue on Crestor    LIpids in April 2025 look good   5 PAF. Occurred after bowel surgery   Monitor in Jan 2022 showed no afib  Elqius stopped  Pt denies palpitations     6  PVCs   Monitor in Aug 2022 showed SR with 13% PVCs Pt  denies  palpitations   LVEF has remained normal   Follow   Follow up in Spring 2026    Current medicines are reviewed at length with the patient today.  The patient does not have concerns regarding medicines.  Signed, Vina Gull, MD  01/16/2024 9:36 AM    [facility] [address] [place], [place], [place]  [address] Phone: [phone]; Fax: [phone]

## 2024-01-16 ENCOUNTER — Ambulatory Visit: Attending: Cardiology | Admitting: Internal Medicine

## 2024-01-16 ENCOUNTER — Encounter: Payer: Self-pay | Admitting: Internal Medicine

## 2024-01-16 VITALS — BP 108/52 | HR 52 | Ht 67.0 in | Wt 146.4 lb

## 2024-01-16 DIAGNOSIS — I2510 Atherosclerotic heart disease of native coronary artery without angina pectoris: Secondary | ICD-10-CM

## 2024-01-16 NOTE — Progress Notes (Signed)
 See other note

## 2024-01-16 NOTE — Patient Instructions (Signed)
 Medication Instructions:  Your physician recommends that you continue on your current medications as directed. Please refer to the Current Medication list given to you today.  *If you need a refill on your cardiac medications before your next appointment, please call your pharmacy*  Follow-Up: At [facility], you and your health needs are our priority.  As part of our continuing mission to provide you with exceptional heart care, our providers are all part of one team.  This team includes your primary Cardiologist (physician) and Advanced Practice Providers or APPs (Physician Assistants and Nurse Practitioners) who all work together to provide you with the care you need, when you need it.  Your next appointment:   9 month(s)  Provider:   Vina Gull, MD   We recommend signing up for the patient portal called MyChart.  Sign up information is provided on this After Visit Summary.  MyChart is used to connect with patients for Virtual Visits (Telemedicine).  Patients are able to view lab/test results, encounter notes, upcoming appointments, etc.  Non-urgent messages can be sent to your provider as well.    To learn more about what you can do with MyChart, go to [email or web address].

## 2024-03-16 ENCOUNTER — Ambulatory Visit (LOCAL_COMMUNITY_HEALTH_CENTER)

## 2024-03-16 DIAGNOSIS — Z23 Encounter for immunization: Secondary | ICD-10-CM

## 2024-03-16 DIAGNOSIS — Z719 Counseling, unspecified: Secondary | ICD-10-CM

## 2024-03-16 NOTE — Progress Notes (Signed)
 HV requested by pt and his wife who is homebound for shingles vaccine.  Vaccine screening form completed and signed by pt (sent for scanning). VIS provided and reviewed with pt.   States he received the single shingles vaccine probably 10 years ago and did fine;  no documentation of vaccine in epic or [facility].     Shingrix vaccine administered IM left deltoid; tolerated by pt.  Reviewed post vaccination care and when next Shingrix vaccine due.  NCIR updated and copy mailed to pt.    Shona KATHEE Diesel, RN

## 2024-04-24 ENCOUNTER — Other Ambulatory Visit: Payer: Self-pay

## 2024-04-24 ENCOUNTER — Emergency Department
Admission: EM | Admit: 2024-04-24 | Discharge: 2024-04-24 | Disposition: A | Discharge status: Home / Self Care | Attending: Emergency Medicine | Admitting: Emergency Medicine

## 2024-04-24 ENCOUNTER — Emergency Department

## 2024-04-24 DIAGNOSIS — I2510 Atherosclerotic heart disease of native coronary artery without angina pectoris: Secondary | ICD-10-CM | POA: Insufficient documentation

## 2024-04-24 DIAGNOSIS — R0789 Other chest pain: Secondary | ICD-10-CM | POA: Insufficient documentation

## 2024-04-24 DIAGNOSIS — R079 Chest pain, unspecified: Secondary | ICD-10-CM

## 2024-04-24 LAB — BASIC METABOLIC PANEL WITH GFR
Anion gap: 6 (ref 5–15)
BUN: 23 mg/dL (ref 8–23)
CO2: 32 mmol/L (ref 22–32)
Calcium: 9.4 mg/dL (ref 8.9–10.3)
Chloride: 101 mmol/L (ref 98–111)
Creatinine, Ser: 0.74 mg/dL (ref 0.61–1.24)
GFR, Estimated: >60 mL/min (ref >60)
Glucose, Bld: 121 mg/dL — ABNORMAL HIGH (ref 70–99)
Potassium: 5.1 mmol/L (ref 3.5–5.1)
Sodium: 138 mmol/L (ref 135–145)

## 2024-04-24 LAB — CBC
HCT: 40.5 % (ref 39.0–52.0)
Hemoglobin: 13.3 g/dL (ref 13.0–17.0)
MCH: 32.3 pg (ref 26.0–34.0)
MCHC: 32.8 g/dL (ref 30.0–36.0)
MCV: 98.3 fL (ref 80.0–100.0)
Platelets: 111 K/uL — ABNORMAL LOW (ref 150–400)
RBC: 4.12 MIL/uL — ABNORMAL LOW (ref 4.22–5.81)
RDW: 11.9 % (ref 11.5–15.5)
WBC: 8.7 K/uL (ref 4.0–10.5)
nRBC: 0 % (ref 0.0–0.2)

## 2024-04-24 LAB — TROPONIN T, HIGH SENSITIVITY
Troponin T High Sensitivity: <15 ng/L (ref 0–19)
Troponin T High Sensitivity: <15 ng/L (ref 0–19)

## 2024-04-24 MED ORDER — ASPIRIN 81 MG PO CHEW
162.0000 mg | CHEWABLE_TABLET | Freq: Once | ORAL | Status: AC
  Administered 2024-04-24: 162 mg via ORAL
  Filled 2024-04-24: qty 2

## 2024-04-24 MED ORDER — NITROGLYCERIN 0.4 MG SL SUBL
0.4000 mg | SUBLINGUAL_TABLET | SUBLINGUAL | Status: DC | PRN
  Administered 2024-04-24: 0.4 mg via SUBLINGUAL
  Filled 2024-04-24: qty 1

## 2024-04-24 NOTE — Discharge Instructions (Signed)
 You were seen in the Emergency Department today for evaluation of your chest pain. Fortunately, your labs, EKG, and chest x-[name] [name] were overall reassuring against a emergency cause for your pain. I have placed a referral to cardiology for further evaluation of your chest pain.  Return to the ER for any new or worsening symptoms including worsening chest pain, difficulty breathing, or any other new or concerning symptoms that you believe warrants immediate attention.

## 2024-04-24 NOTE — ED Triage Notes (Signed)
 Pt comes in via [facility] from home with complaints of chest pain after waking up this morning. Pt states he felt fine when he first woke up, and then started having dizziness, and pain in the left side of his chest that radiates to his left arm. Pt was given 1 nitroglycerin , 324 of aspirin . Pt has a history of valve replacement, and a heart attack.   142/67 94% 62 HR 122 BGC

## 2024-04-24 NOTE — ED Provider Notes (Signed)
 [facility] Provider Note    Event Date/Time   First MD Initiated Contact with Patient 04/24/24 [id number]     (approximate)   History   No chief complaint on file.   HPI  [name] [name] is a 89 year old male with history of CAD, aortic stenosis presenting to the emergency department for evaluation of chest pain.  Around 5 AM this morning patient had onset of chest pain described as a pressure in the center of his chest.  This lasted for a while.  Took 2 baby aspirin  (correction from triage note) and 1 nitroglycerin .  Does feel pain is improved currently but persistent, currently rates as a 4 out of 10.  Unsure if this feels similar to his prior MI.  Reviewed cardiology visit from 01/16/2024.  Had TAVR in May 2024 with echo and May 2025 with good valve function.  Heart cath in April 2024 with mild CAD.       Physical Exam   Triage Vital Signs: ED Triage Vitals  Encounter Vitals Group     BP 04/24/24 0739 (!) 141/70     Girls Systolic BP Percentile --      Girls Diastolic BP Percentile --      Boys Systolic BP Percentile --      Boys Diastolic BP Percentile --      Pulse Rate 04/24/24 0739 (!) 59     Resp 04/24/24 0739 18     Temp 04/24/24 0739 98.1 F (36.7 C)     Temp src --      SpO2 04/24/24 0739 98 %     Weight 04/24/24 0733 146 lb 6.2 oz (66.4 kg)     Height 04/24/24 0733 5' 7 (1.702 m)     Head Circumference --      Peak Flow --      Pain Score --      Pain Loc --      Pain Education --      Exclude from Growth Chart --     Most recent vital signs: Vitals:   04/24/24 0739 04/24/24 0808  BP: (!) 141/70   Pulse: (!) 59   Resp: 18   Temp: 98.1 F (36.7 C)   SpO2: 98% 100%     General: Awake, interactive  CV:  Good peripheral perfusion Resp:  Unlabored respirations, lungs clear to aucultation Abd:  Nondistended.  Neuro:  Symmetric facial movement, fluid speech   ED Results / Procedures / Treatments   Labs (all labs ordered  are listed, but only abnormal results are displayed) Labs Reviewed  BASIC METABOLIC PANEL WITH GFR - Abnormal; Notable for the following components:      Result Value   Glucose, Bld 121 (*)    All other components within normal limits  CBC - Abnormal; Notable for the following components:   RBC 4.12 (*)    Platelets 111 (*)    All other components within normal limits  TROPONIN T, HIGH SENSITIVITY  TROPONIN T, HIGH SENSITIVITY     EKG EKG independently reviewed and interpreted by myself demonstrates:  EKG demonstrates normal sinus rhythm at a rate of 62, PR 126, QRS 82, QTc 420, no acute ST changes  RADIOLOGY Imaging independently reviewed and interpreted by myself demonstrates:  CXR without focal consolidation  Formal Radiology Read:  DG Chest 2 View Result Date: 04/24/2024 CLINICAL DATA:  chest pain EXAM: CHEST - 2 VIEW COMPARISON:  Sep 28, 2022 FINDINGS: Similar chronic coarsening  of the pulmonary interstitium with a peripheral and bibasilar predominance, consistent with underlying interstitial lung disease. No focal airspace consolidation, pleural effusion, or pneumothorax. No cardiomegaly.Endovascular aortic valve replacement. Surgical clips in the epigastrium.No acute fracture or destructive lesion. Osteopenia. Mild dextrocurvature of the thoracolumbar spine. IMPRESSION: Redemonstrated findings of underlying interstitial lung disease. Otherwise, no acute cardiopulmonary abnormality. Electronically Signed   By: Rogelia Myers M.D.   On: 04/24/2024 08:55    PROCEDURES:  Critical Care performed: No  Procedures   MEDICATIONS ORDERED IN ED: Medications  nitroGLYCERIN  (NITROSTAT ) SL tablet 0.4 mg (0.4 mg Sublingual Given 04/24/24 0809)  aspirin  chewable tablet 162 mg (162 mg Oral Given 04/24/24 0807)     IMPRESSION / MDM / ASSESSMENT AND PLAN / ED COURSE  I reviewed the triage vital signs and the nursing notes.  Differential diagnosis includes, but is not limited to, ACS,  pneumonia, pneumothorax, complication of aortic stenosis, much lower suspicion PE or dissection based on clinical history with improved symptoms, no shortness of breath  Patient's presentation is most consistent with acute presentation with potential threat to life or bodily function.  89 year old male presenting to the emergency department for evaluation of chest pain.  Stable vitals on presentation.  Will obtain labs, EKG, x-Evelena Masci to further evaluate.  Ordered for aspirin  and as needed nitroglycerin .  Labs with reassuring CBC, BMP, negative troponin x 2.  EKG without acute ischemic changes.  X-Wilberth Damon without significant change from prior.  Patient reassessed and reports complete resolution of his symptoms.  He would like to be discharged home.  We did discuss that his testing today did not fully rule out possible cardiac cause of his symptoms.  He is agreeable with close outpatient follow-up with cardiology.  Strict return precautions provided.  Patient discharged stable condition.        FINAL CLINICAL IMPRESSION(S) / ED DIAGNOSES   Final diagnoses:  Nonspecific chest pain     Rx / DC Orders   ED Discharge Orders          Ordered    Ambulatory referral to Cardiology       Comments: If you have not heard from the Cardiology office within the next 72 hours please call [phone].   04/24/24 1022             Note:  This document was prepared using Dragon voice recognition software and may include unintentional dictation errors.   Levander Slate, MD 04/24/24 1022

## 2024-04-24 NOTE — ED Notes (Signed)
 Pt given DC instructions. Pt verbalized understanding of medications and follow up care. Pt taken from ED in wheelchair by this RN.

## 2024-06-03 ENCOUNTER — Ambulatory Visit

## 2024-06-04 ENCOUNTER — Ambulatory Visit

## 2024-06-04 DIAGNOSIS — Z23 Encounter for immunization: Secondary | ICD-10-CM | POA: Diagnosis not present

## 2024-06-04 DIAGNOSIS — Z719 Counseling, unspecified: Secondary | ICD-10-CM

## 2024-06-04 NOTE — Progress Notes (Signed)
 Homevisit to administer 2nd shingles vaccine to pt and his wife.  States he did fine after first shingles vaccine.  Refer to vaccine screening questions signed by pt (sent for scanning).  VIS given.    Shingrix  vaccine administered by Devere Pizza RN; tolerated well. Reviewed post vaccination care.  NCIR updated and copy mailed to pt.  Shona KATHEE Diesel, RN

## 2024-06-06 NOTE — Progress Notes (Unsigned)
 " Cardiology Office Note:    Date:  06/08/2024   ID:  [name] [name], DOB 05/11/1934, MRN [id number]  PCP:  [name], MD   [facility] Providers Cardiologist:  Vina Gull, MD Structural Heart:  Lonni Cash, MD    Referring MD: [name], MD   Chief complaint: Follow-up recent ED visit     History of Present Illness:   [name] [name] is a 89 y.o. 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  He had a NSTEMI in 2007 and had occlusion of the obtuse marginal branch. Flow was restored with passage of the wire and no angioplasty was performed. Mild disease in the LAD at that time. Nuclear stress test in 2021 with no ischemia. He developed post-operative atrial fibrillation following a bowel resection in 2021 and was on Eliquis  for a short time but this was stopped in 2022 after a monitor did not demonstrate recurrence of his atrial fib. He had been followed for aortic stenosis.  Echo 08/16/22 showed EF 50%, and severe paradoxical LFLG AS with a mean grad 24 mmHg, AVA 1.12 cm2, DVI 0.29. [facility] 09/06/22 showed mild non-obstructive CAD and normal right heart pressures. S/p TAVR with a 29 mm Edwards Sapien 3 Ultra Resilia THV via the TF approach on 10/02/22. Post operative echo showed EF 60%, moderate concentric LVH, normally functioning TAVR with a mean gradient of 4 mmHg and two jets of trivial PVL. He was discharged on a baby aspirin  81mg  daily. He was screened for amyloid heart disease with labs and a PYP scan which were negative.   Most recently seen with cardiology on 01/16/2024 with Dr. Gull, was doing well at that time, had recently built a shed for his cars.  He presented to the emergency department 04/24/2024 with complaints of  chest pain that felt somewhat improved following 2 baby aspirin  and 1 nitroglycerin .  Labs, troponins, chest x-ray all unremarkable.  EKG sinus rhythm, 62 bpm, no acute ST changes.  Discharged same day.  Presents independently, appears stable from a cardiovascular standpoint. Asymptomatic today. He denies chest pain, palpitations, dyspnea, orthopnea, n, v,  dark/tarry/bloody stools, hematuria, dizziness, syncope, edema, weight gain.  Reports the day of the chest pain is symptoms were relieved with a couple of aspirin  and a few doses of nitroglycerin .  He reports he tries to walk a mile a few times a week, no recurrence of chest pain or other anginal symptoms during his walk following his last visit to the ED.  Denies any recurrence of similar symptoms since his ED visit.  Reports compliance with all medications.  BPs are well-controlled.  ROS:   Please see the history of present illness.    All other systems reviewed and are negative.     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 Verlin & Dr. Maryjane   Severe aortic stenosis    Syncope    hx    Past Surgical History:  Procedure Laterality Date   APPLICATION OF WOUND VAC N/A 10/17/2019   Procedure: APPLICATION OF WOUND VAC;  Surgeon: Desiderio Schanz, MD;  Location: [facility] ORS;  Service: General;  Laterality: N/A;  [id number]    APPLICATION OF WOUND VAC  10/18/2019   Procedure: APPLICATION OF WOUND VAC;  Surgeon: Desiderio Schanz, MD;   Location: [facility] ORS;  Service: General;;   BOWEL RESECTION N/A 10/17/2019   Procedure: SMALL BOWEL RESECTION;  Surgeon: Desiderio Schanz, MD;  Location: [facility] ORS;  Service: General;  Laterality: N/A;   BOWEL RESECTION  10/18/2019   Procedure: SMALL BOWEL RESECTION;  Surgeon: Desiderio Schanz, MD;  Location: [facility] OR[facility];  Service: General;;   CARDIAC CATHETERIZATION     coronary arteriogram     EXPLORATORY LAPAROTOMY  1970s   for bleeding peptic ulcer   INTRAOPERATIVE TRANSTHORACIC ECHOCARDIOGRAM N/A 10/02/2022   Procedure: INTRAOPERATIVE TRANSTHORACIC ECHOCARDIOGRAM;  Surgeon: Verlin Lonni BIRCH, MD;  [facility]: [facility];  Service: Open Heart Surgery;  Laterality: N/A;   LAPAROTOMY N/A 10/17/2019   Procedure: EXPLORATORY LAPAROTOMY;  Surgeon: Desiderio Schanz, MD;  Location: [facility];  Service: General;  Laterality: N/A;   LAPAROTOMY N/A 10/18/2019   Procedure: EXPLORATORY LAPAROTOMY;  Surgeon: Desiderio Schanz, MD;  Location: [facility];  Service: General;  Laterality: N/A;   left ventriculogram     RIGHT HEART CATH AND CORONARY ANGIOGRAPHY N/A 09/06/2022   Procedure: RIGHT HEART CATH AND CORONARY ANGIOGRAPHY;  Surgeon: Verlin Lonni BIRCH, MD;  Location: [facility];  Service: Cardiovascular;  Laterality: N/A;   TRANSCATHETER AORTIC VALVE REPLACEMENT, TRANSFEMORAL Left 10/02/2022   Procedure: Transcatheter Aortic Valve Replacement, Transfemoral;  Surgeon: Verlin Lonni BIRCH, MD;  Location: [facility];  Service: Open Heart Surgery;  Laterality: Left;    Current Medications: Active Medications[1]   Allergies:   Tetanus toxoid, adsorbed and Tetanus toxoid-containing vaccines   Social History   Socioeconomic History   Marital status: Divorced    Spouse name: Not on file   Number of children: 3   Years of education: Not on file   Highest education level: Not on file  Occupational History   Occupation: Retired naval architect  Tobacco Use   Smoking status: Never    Smokeless tobacco: Former    Types: Associate Professor status: Never Used  Substance and Sexual Activity   Alcohol use: No    Alcohol/week: 0.0 standard drinks of alcohol   Drug use: No   Sexual activity: Not on file  Other Topics Concern   Not on file  Social History Narrative   He is married, divorced, and now leaving with his ex-wife. Continues to drive tractor-trailers.    Social Drivers of Health   Tobacco Use: Medium Risk (06/08/2024)   Patient History    Smoking Tobacco Use: Never    Smokeless Tobacco Use: Former    Passive Exposure: Not on Actuary Strain: Not on file  Food Insecurity: Not on file  Transportation Needs: Not on file  Physical Activity: Not on file  Stress: Not on file  Social Connections: Not on file  Depression (EYV7-0): Not on file  Alcohol Screen: Not  on file  Housing: Unknown (08/20/2023)   Received from [facility]   Epic    Unable to Pay for Housing in the Last Year: Not on file    Number of Times Moved in the Last Year: Not on file    At any time in the past 12 months, were you homeless or living in a shelter (including now)?: No  Utilities: Not on file  Health Literacy: Not on file     Family History: The patient's family history includes Heart attack (age of onset: 40) in his father; Heart attack (age of onset: 35) in his mother.  EKGs/Labs/Other Studies Reviewed:    The following studies were reviewed today:  EKG Interpretation Date/Time:  Monday June 08 2024 10:00:34 EST Ventricular Rate:  63 PR Interval:  128 QRS Duration:  84 QT Interval:  400 QTC Calculation: 409 R Axis:   1  Text Interpretation: Normal sinus rhythm Cannot rule out Anterior infarct (cited on or before 30-Sep-2023) TWI in III present in prior studies, new in aVF Confirmed by Jayce Kainz [phone]) on 06/08/2024 10:57:01 AM    Recent Labs: 04/24/2024: BUN 23; Creatinine, Ser 0.74; Hemoglobin 13.3; Platelets 111;  Potassium 5.1; Sodium 138  Recent Lipid Panel    Component Value Date/Time   CHOL 148 01/18/2020 1641   TRIG 138 01/18/2020 1641   HDL 62 01/18/2020 1641   CHOLHDL 2.4 01/18/2020 1641   CHOLHDL 2.8 10/27/2018 0819   VLDL 17 10/27/2018 0819   LDLCALC 62 01/18/2020 1641     Risk Assessment/Calculations:                Physical Exam:    VS:  BP 116/82   Pulse 63   Ht 5' 7 (1.702 m)   Wt 157 lb (71.2 kg)   SpO2 94%   BMI 24.59 kg/m        Wt Readings from Last 3 Encounters:  06/08/24 157 lb (71.2 kg)  04/24/24 146 lb 6.2 oz (66.4 kg)  01/16/24 146 lb 6.4 oz (66.4 kg)     GEN: Well nourished, well developed in no acute distress HEENT: Normal NECK: No carotid bruits CARDIAC:  S1-S2 normal, RRR, no murmurs, rubs, gallops RESPIRATORY:  Clear to auscultation without rales, wheezing or rhonchi  MUSCULOSKELETAL:  No edema; No deformity  SKIN: Warm and dry NEUROLOGIC:  Alert and oriented x 3 PSYCHIATRIC:  Normal affect       Assessment & Plan Coronary artery disease involving native coronary artery of native heart without angina pectoris [facility] 09/06/22 showed mild non-obstructive CAD and normal right heart pressures. Asymptomatic at rest and with exertion following recent episode of chest pain in early December, troponins negative in the ED.  Denies recurrence of anginal symptoms.  He is requesting refill of his nitroglycerin  as he believes his has expired, denies further use of nitroglycerin .  Discussed case/EKG with DOD, Dr. Kriste.  Will continue to monitor for recurrence of symptoms.  ED precautions discussed.  If symptoms recur, could plan for PET stress test in the future.  Continue aspirin  81 mg daily, Toprol -XL 25 mg daily, Crestor  40 mg daily. Repeat CBC given platelet trending down, 111 in ED. Severe aortic stenosis S/P TAVR (transcatheter aortic valve replacement) Echo 09/2023: LVEF 60-65%, no RWMA, mild LVH, G1 DD, AVR visualized, 6 mm gradient. Continue with  annual imaging at follow-up in 6 months with primary cardiologist Denies SOB, orthopnea, PND, palpitations, edema SBE discussed.  Patient edentulous, does not  go to dentist office. Continue to monitor Mixed hyperlipidemia Will plan to update fasting lipids and LFTs at some point over next week considering patient ate breakfast this morning LDL goal less than 70 Continue Crestor  40 mg daily PAF (paroxysmal atrial fibrillation) (HCC) Occurred in the postoperative setting following bowel surgery No recurrence.  No OAC use PVC's (premature ventricular contractions) 12/2020 cardiac event monitor showing 13% PVC burden.   Patient asymptomatic, no palpitations, dizziness, near syncope. Notify office if new symptoms occur.  Disposition: Follow-up in 6 months, or sooner if needed.  Proceed to the ED with any new or worsening symptoms.            Medication Adjustments/Labs and Tests Ordered: Current medicines are reviewed at length with the patient today.  Concerns regarding medicines are outlined above.  Orders Placed This Encounter  Procedures   Hepatic function panel   Lipid panel   CBC   EKG 12-Lead   Meds ordered this encounter  Medications   nitroGLYCERIN  (NITROSTAT ) 0.4 MG SL tablet    Sig: place 1 tablet under the tongue if needed every 5 minutes for chest pain for 3 doses IF NO RELIEF AFTER 3RD DOSE CALL PRESCRIBER OR 911.    Dispense:  25 tablet    Refill:  2    Patient Instructions  Medication Instructions:   Your physician recommends that you continue on your current medications as directed. Please refer to the Current Medication list given to you today.   *If you need a refill on your cardiac medications before your next appointment, please call your pharmacy*   Lab Work:   PLEASE GO DOWN STAIRS  [facility]  FIRST FLOOR   ( GET OFF ELEVATORS WALK TOWARDS WAITING AREA LAB LOCATED BY PHARMACY):   CBC TODAY    RETURN FASTING  FOR LIVER AND LIPID      If you  have labs (blood work) drawn today and your tests are completely normal, you will receive your results only by: MyChart Message (if you have MyChart) OR A paper copy in the mail If you have any lab test that is abnormal or we need to change your treatment, we will call you to review the results.   Testing/Procedures: NONE ORDERED  TODAY      Follow-Up: At [facility], you and your health needs are our priority.  As part of our continuing mission to provide you with exceptional heart care, our providers are all part of one team.  This team includes your primary Cardiologist (physician) and Advanced Practice Providers or APPs (Physician Assistants and Nurse Practitioners) who all work together to provide you with the care you need, when you need it.  Your next appointment:  6 months    Provider:   Vina Gull, MD    We recommend signing up for the patient portal called MyChart.  Sign up information is provided on this After Visit Summary.  MyChart is used to connect with patients for Virtual Visits (Telemedicine).  Patients are able to view lab/test results, encounter notes, upcoming appointments, etc.  Non-urgent messages can be sent to your provider as well.   To learn more about what you can do with MyChart, go to [email or web address].   Other Instructions              Signed, Miriam FORBES Shams, NP  06/08/2024 3:18 PM    [facility]     [1]  Current Meds  Medication Sig  aspirin  81 MG chewable tablet Chew 1 tablet (81 mg total) by mouth daily. Does not have to be chewed. Can swallow whole.   BEE POLLEN PO Take 3 capsules by mouth daily.   metoprolol  succinate (TOPROL -XL) 25 MG 24 hr tablet TAKE 1 TABLET(25 MG) BY MOUTH DAILY   rosuvastatin  (CRESTOR ) 40 MG tablet TAKE 1 TABLET(40 MG) BY MOUTH DAILY   [DISCONTINUED] nitroGLYCERIN  (NITROSTAT ) 0.4 MG SL tablet place 1 tablet under the tongue if needed every 5 minutes for chest pain for 3 doses IF NO  RELIEF AFTER 3RD DOSE CALL PRESCRIBER OR 911.   "

## 2024-06-08 ENCOUNTER — Encounter: Payer: Self-pay | Admitting: Emergency Medicine

## 2024-06-08 ENCOUNTER — Ambulatory Visit: Admitting: Emergency Medicine

## 2024-06-08 VITALS — BP 116/82 | HR 63 | Ht 67.0 in | Wt 157.0 lb

## 2024-06-08 DIAGNOSIS — E782 Mixed hyperlipidemia: Secondary | ICD-10-CM | POA: Diagnosis not present

## 2024-06-08 DIAGNOSIS — I493 Ventricular premature depolarization: Secondary | ICD-10-CM

## 2024-06-08 DIAGNOSIS — I2510 Atherosclerotic heart disease of native coronary artery without angina pectoris: Secondary | ICD-10-CM

## 2024-06-08 DIAGNOSIS — Z952 Presence of prosthetic heart valve: Secondary | ICD-10-CM

## 2024-06-08 DIAGNOSIS — I480 Paroxysmal atrial fibrillation: Secondary | ICD-10-CM | POA: Diagnosis not present

## 2024-06-08 DIAGNOSIS — I350 Nonrheumatic aortic (valve) stenosis: Secondary | ICD-10-CM

## 2024-06-08 MED ORDER — NITROGLYCERIN 0.4 MG SL SUBL: SUBLINGUAL_TABLET | SUBLINGUAL | 2 refills | Status: AC

## 2024-06-08 NOTE — Assessment & Plan Note (Signed)
 12/2020 cardiac event monitor showing 13% PVC burden.   Patient asymptomatic, no palpitations, dizziness, near syncope. Notify office if new symptoms occur.

## 2024-06-08 NOTE — Assessment & Plan Note (Signed)
 Will plan to update fasting lipids and LFTs at some point over next week considering patient ate breakfast this morning LDL goal less than 70 Continue Crestor  40 mg daily

## 2024-06-08 NOTE — Patient Instructions (Addendum)
 Medication Instructions:   Your physician recommends that you continue on your current medications as directed. Please refer to the Current Medication list given to you today.   *If you need a refill on your cardiac medications before your next appointment, please call your pharmacy*   Lab Work:   PLEASE GO DOWN STAIRS  [facility]  FIRST FLOOR   ( GET OFF ELEVATORS WALK TOWARDS WAITING AREA LAB LOCATED BY PHARMACY):   CBC TODAY    RETURN FASTING  FOR LIVER AND LIPID      If you have labs (blood work) drawn today and your tests are completely normal, you will receive your results only by: MyChart Message (if you have MyChart) OR A paper copy in the mail If you have any lab test that is abnormal or we need to change your treatment, we will call you to review the results.   Testing/Procedures: NONE ORDERED  TODAY      Follow-Up: At [facility], you and your health needs are our priority.  As part of our continuing mission to provide you with exceptional heart care, our providers are all part of one team.  This team includes your primary Cardiologist (physician) and Advanced Practice Providers or APPs (Physician Assistants and Nurse Practitioners) who all work together to provide you with the care you need, when you need it.  Your next appointment:  6 months    Provider:   Vina Gull, MD    We recommend signing up for the patient portal called MyChart.  Sign up information is provided on this After Visit Summary.  MyChart is used to connect with patients for Virtual Visits (Telemedicine).  Patients are able to view lab/test results, encounter notes, upcoming appointments, etc.  Non-urgent messages can be sent to your provider as well.   To learn more about what you can do with MyChart, go to [email or web address].   Other Instructions

## 2024-06-08 NOTE — Assessment & Plan Note (Signed)
 Occurred in the postoperative setting following bowel surgery No recurrence.  No OAC use

## 2024-06-08 NOTE — Assessment & Plan Note (Addendum)
 Echo 09/2023: LVEF 60-65%, no RWMA, mild LVH, G1 DD, AVR visualized, 6 mm gradient. Continue with annual imaging at follow-up in 6 months with primary cardiologist Denies SOB, orthopnea, PND, palpitations, edema SBE discussed.  Patient edentulous, does not go to dentist office. Continue to monitor

## 2024-06-08 NOTE — Assessment & Plan Note (Addendum)
 [facility] 09/06/22 showed mild non-obstructive CAD and normal right heart pressures. Asymptomatic at rest and with exertion following recent episode of chest pain in early December, troponins negative in the ED.  Denies recurrence of anginal symptoms.  He is requesting refill of his nitroglycerin  as he believes his has expired, denies further use of nitroglycerin .  Discussed case/EKG with DOD, Dr. Kriste.  Will continue to monitor for recurrence of symptoms.  ED precautions discussed.  If symptoms recur, could plan for PET stress test in the future.  Continue aspirin  81 mg daily, Toprol -XL 25 mg daily, Crestor  40 mg daily. Repeat CBC given platelet trending down, 111 in ED.
